# Patient Record
Sex: Female | Born: 1935 | Race: White | Hispanic: No | Marital: Married | Smoking: Never smoker
Health system: Southern US, Community
[De-identification: ages and names within clinical notes are randomized; demographics above are authoritative.]

## PROBLEM LIST (undated history)

## (undated) DIAGNOSIS — IMO0001 Reserved for inherently not codable concepts without codable children: Secondary | ICD-10-CM

## (undated) DIAGNOSIS — I4510 Unspecified right bundle-branch block: Secondary | ICD-10-CM

## (undated) DIAGNOSIS — R000 Tachycardia, unspecified: Secondary | ICD-10-CM

## (undated) DIAGNOSIS — I219 Acute myocardial infarction, unspecified: Secondary | ICD-10-CM

## (undated) DIAGNOSIS — I5033 Acute on chronic diastolic (congestive) heart failure: Secondary | ICD-10-CM

## (undated) DIAGNOSIS — W19XXXA Unspecified fall, initial encounter: Secondary | ICD-10-CM

## (undated) DIAGNOSIS — E039 Hypothyroidism, unspecified: Secondary | ICD-10-CM

## (undated) DIAGNOSIS — I2510 Atherosclerotic heart disease of native coronary artery without angina pectoris: Secondary | ICD-10-CM

## (undated) DIAGNOSIS — E785 Hyperlipidemia, unspecified: Secondary | ICD-10-CM

## (undated) DIAGNOSIS — M8580 Other specified disorders of bone density and structure, unspecified site: Secondary | ICD-10-CM

## (undated) DIAGNOSIS — R739 Hyperglycemia, unspecified: Secondary | ICD-10-CM

## (undated) DIAGNOSIS — I10 Essential (primary) hypertension: Secondary | ICD-10-CM

## (undated) DIAGNOSIS — I5189 Other ill-defined heart diseases: Secondary | ICD-10-CM

## (undated) HISTORY — DX: Hyperlipidemia, unspecified: E78.5

## (undated) HISTORY — DX: Acute myocardial infarction, unspecified: I21.9

## (undated) HISTORY — DX: Unspecified right bundle-branch block: I45.10

## (undated) HISTORY — DX: Hypothyroidism, unspecified: E03.9

## (undated) HISTORY — DX: Atherosclerotic heart disease of native coronary artery without angina pectoris: I25.10

## (undated) HISTORY — DX: Reserved for inherently not codable concepts without codable children: IMO0001

## (undated) HISTORY — DX: Essential (primary) hypertension: I10

## (undated) HISTORY — DX: Unspecified fall, initial encounter: W19.XXXA

## (undated) HISTORY — DX: Other specified disorders of bone density and structure, unspecified site: M85.80

---

## 1988-11-25 DIAGNOSIS — I219 Acute myocardial infarction, unspecified: Secondary | ICD-10-CM

## 1988-11-25 HISTORY — DX: Acute myocardial infarction, unspecified: I21.9

## 1989-09-22 HISTORY — PX: CARDIAC CATHETERIZATION: SHX172

## 1994-10-18 HISTORY — PX: CARDIAC CATHETERIZATION: SHX172

## 1998-03-06 ENCOUNTER — Other Ambulatory Visit: Admission: RE | Admit: 1998-03-06 | Discharge: 1998-03-06 | Payer: Self-pay | Admitting: Cardiology

## 2000-06-30 ENCOUNTER — Encounter: Admission: RE | Admit: 2000-06-30 | Discharge: 2000-06-30 | Payer: Self-pay | Admitting: Family Medicine

## 2000-06-30 ENCOUNTER — Encounter: Payer: Self-pay | Admitting: Family Medicine

## 2002-09-21 ENCOUNTER — Ambulatory Visit (HOSPITAL_COMMUNITY): Admission: RE | Admit: 2002-09-21 | Discharge: 2002-09-21 | Payer: Self-pay | Admitting: Cardiology

## 2005-09-09 ENCOUNTER — Encounter: Admission: RE | Admit: 2005-09-09 | Discharge: 2005-09-09 | Payer: Self-pay | Admitting: Orthopedic Surgery

## 2005-09-10 ENCOUNTER — Ambulatory Visit (HOSPITAL_COMMUNITY): Admission: RE | Admit: 2005-09-10 | Discharge: 2005-09-10 | Payer: Self-pay | Admitting: Orthopedic Surgery

## 2005-09-10 ENCOUNTER — Ambulatory Visit (HOSPITAL_BASED_OUTPATIENT_CLINIC_OR_DEPARTMENT_OTHER): Admission: RE | Admit: 2005-09-10 | Discharge: 2005-09-10 | Payer: Self-pay | Admitting: Orthopedic Surgery

## 2005-12-10 ENCOUNTER — Ambulatory Visit (HOSPITAL_BASED_OUTPATIENT_CLINIC_OR_DEPARTMENT_OTHER): Admission: RE | Admit: 2005-12-10 | Discharge: 2005-12-10 | Payer: Self-pay | Admitting: Orthopedic Surgery

## 2008-02-10 ENCOUNTER — Encounter: Admission: RE | Admit: 2008-02-10 | Discharge: 2008-02-10 | Payer: Self-pay | Admitting: Internal Medicine

## 2008-12-30 ENCOUNTER — Observation Stay (HOSPITAL_COMMUNITY): Admission: EM | Admit: 2008-12-30 | Discharge: 2008-12-31 | Payer: Self-pay | Admitting: Emergency Medicine

## 2009-06-15 ENCOUNTER — Emergency Department (HOSPITAL_COMMUNITY): Admission: EM | Admit: 2009-06-15 | Discharge: 2009-06-15 | Payer: Self-pay | Admitting: Emergency Medicine

## 2009-06-16 ENCOUNTER — Ambulatory Visit: Payer: Self-pay | Admitting: Vascular Surgery

## 2009-10-25 HISTORY — PX: CATARACT EXTRACTION, BILATERAL: SHX1313

## 2010-10-11 ENCOUNTER — Ambulatory Visit: Payer: Self-pay | Admitting: Vascular Surgery

## 2011-02-27 ENCOUNTER — Encounter: Payer: Self-pay | Admitting: Internal Medicine

## 2011-03-03 LAB — CBC
HCT: 37.2 % (ref 36.0–46.0)
MCHC: 33.9 g/dL (ref 30.0–36.0)
MCV: 93.4 fL (ref 78.0–100.0)
RBC: 3.98 MIL/uL (ref 3.87–5.11)
WBC: 5.4 10*3/uL (ref 4.0–10.5)

## 2011-03-03 LAB — DIFFERENTIAL
Basophils Absolute: 0 10*3/uL (ref 0.0–0.1)
Lymphocytes Relative: 28 % (ref 12–46)
Neutro Abs: 3.3 10*3/uL (ref 1.7–7.7)
Neutrophils Relative %: 61 % (ref 43–77)

## 2011-03-03 LAB — COMPREHENSIVE METABOLIC PANEL
BUN: 6 mg/dL (ref 6–23)
CO2: 28 mEq/L (ref 19–32)
Calcium: 10.1 mg/dL (ref 8.4–10.5)
Chloride: 100 mEq/L (ref 96–112)
Creatinine, Ser: 0.72 mg/dL (ref 0.4–1.2)
GFR calc non Af Amer: >60 mL/min (ref >60)
Total Bilirubin: 1.2 mg/dL (ref 0.3–1.2)

## 2011-03-12 LAB — CBC
HCT: 37.1 % (ref 36.0–46.0)
Hemoglobin: 12.8 g/dL (ref 12.0–15.0)
MCV: 91.1 fL (ref 78.0–100.0)
RBC: 4.07 MIL/uL (ref 3.87–5.11)
WBC: 5.4 10*3/uL (ref 4.0–10.5)

## 2011-03-12 LAB — POCT CARDIAC MARKERS
CKMB, poc: 2 ng/mL (ref 1.0–8.0)
Myoglobin, poc: 147 ng/mL (ref 12–200)

## 2011-03-12 LAB — URINALYSIS, ROUTINE W REFLEX MICROSCOPIC
Ketones, ur: NEGATIVE mg/dL
Nitrite: NEGATIVE
Protein, ur: NEGATIVE mg/dL
pH: 6 (ref 5.0–8.0)

## 2011-03-12 LAB — CARDIAC PANEL(CRET KIN+CKTOT+MB+TROPI)
CK, MB: 2.3 ng/mL (ref 0.3–4.0)
Total CK: 107 U/L (ref 7–177)
Troponin I: <0.01 ng/mL (ref 0.00–0.06)

## 2011-03-12 LAB — TROPONIN I: Troponin I: <0.01 ng/mL (ref 0.00–0.06)

## 2011-03-12 LAB — DIFFERENTIAL
Eosinophils Absolute: 0.1 10*3/uL (ref 0.0–0.7)
Lymphs Abs: 2.1 10*3/uL (ref 0.7–4.0)
Monocytes Relative: 11 % (ref 3–12)
Neutrophils Relative %: 49 % (ref 43–77)

## 2011-03-12 LAB — BASIC METABOLIC PANEL
Chloride: 101 mEq/L (ref 96–112)
Creatinine, Ser: 0.72 mg/dL (ref 0.4–1.2)
GFR calc Af Amer: >60 mL/min (ref >60)
Potassium: 3.6 mEq/L (ref 3.5–5.1)
Sodium: 136 mEq/L (ref 135–145)

## 2011-03-12 LAB — GLUCOSE, CAPILLARY: Glucose-Capillary: 114 mg/dL — ABNORMAL HIGH (ref 70–99)

## 2011-03-12 LAB — CK TOTAL AND CKMB (NOT AT ARMC): Relative Index: 3.1 — ABNORMAL HIGH (ref 0.0–2.5)

## 2011-04-09 NOTE — Discharge Summary (Signed)
Erika [name], Erika [name]               ACCOUNT NO.:  [id number]   MEDICAL RECORD NO.:  [id number]          PATIENT TYPE:  OBV   LOCATION:  4729                         FACILITY:  [facility]   PHYSICIAN:  Gaspar Garbe, M.D.DATE OF BIRTH:  Apr 19, 1928   DATE OF ADMISSION:  12/30/2008  DATE OF DISCHARGE:  12/31/2008                               DISCHARGE SUMMARY   FINAL DIAGNOSES:  1. Loss of consciousness status post fall, momentary.  2. History of hypertension.  3. Hyperlipidemia.  4. Coronary artery disease.  5. History of gout.  6. Osteopenia.   MEDICATIONS ON DISCHARGE:  Unchanged from admission which include:  1. Lipitor 20 mg once daily.  2. Zetia 10 mg once daily.  3. Aspirin 325 mg once daily.  4. Amlodipine 5 mg once daily.  5. Vitamin D, calcium, multivitamins as prior.  6. Bumex as needed for swelling.   PHYSICAL EXAMINATION ON DATE OF DISCHARGE:  VITAL SIGNS:  Blood pressure  140/84, heart rate 81, respiratory rate 16, temperature 98.6, sating 98%  on room air.  GENERAL:  In no acute distress.  HEENT:  Normocephalic and atraumatic.  PERRLA.  EOMI.  ENT is within  normal limits.  NECK:  Supple.  No lymphadenopathy, JVD, or bruit.  HEART:  Regular rate and rhythm.  No murmur, rub, or gallop.  LUNGS:  Clear to auscultation bilaterally.  ABDOMEN:  Soft, nontender, normoactive bowel sounds.  NEUROLOGIC:  The patient is oriented to person, place, and time with no  weakness and no lateralizing signs.   LABORATORY TESTING:  The patient ruled out for MI with negative  troponins and negative CT of her head, negative neck x-rays and negative  chest x-ray at the time of admission.   SUMMARY OF HOSPITAL COURSE:  Ms. Denard was admitted after suffering a  fall with momentary loss of consciousness.  It is now believed that she  most likely tripped and lost consciousness after she hit her head and  had people standing over her.  She was observed in the hospital for 24  hours and  subsequently discharged home without incident.  She had neuro  checks q.4 without any problems and did not have any evidence of  arrhythmia on telemetry  or any changes in her cardiac enzymes.  She is being sent home with  family and is to return to her normal medications.  She is to contact  our office as needed.   CONDITION ON DISCHARGE:  Stable.      Gaspar Garbe, M.D.  Electronically Signed     RWT/MEDQ  D:  12/31/2008  T:  12/31/2008  Job:  [id number]

## 2011-04-09 NOTE — Procedures (Signed)
CAROTID DUPLEX EXAM   INDICATION:  Bilateral carotid stenosis.   HISTORY:  Diabetes:  No.  Cardiac:  MI.  Hypertension:  Yes.  Smoking:  No.  Previous Surgery:  No.  CV History:  06/14/2009, right small thalamus stroke.  The patient is  currently asymptomatic.  Amaurosis Fugax No, Paresthesias No, Hemiparesis No                                       RIGHT             LEFT  Brachial systolic pressure:         180               176  Brachial Doppler waveforms:         WNL               WNL  Vertebral direction of flow:        Antegrade         Antegrade  DUPLEX VELOCITIES (cm/sec)  CCA peak systolic                   71                77  ECA peak systolic                   85                83  ICA peak systolic                   99                80  ICA end diastolic                   20                28  PLAQUE MORPHOLOGY:                  Heterogeneous     Heterogeneous  PLAQUE AMOUNT:                      Moderate          Moderate  PLAQUE LOCATION:                    Proximal ICA and the bulb           Proximal ICA and the  bulb   IMPRESSION:  No hemodynamically significant stenosis.  Bilateral intimal  thickening with the common carotid arteries.  Stable study compared to  previous.    ___________________________________________  Quita Skye Hart [place], M.D.   OD/MEDQ  D:  10/11/2010  T:  10/11/2010  Job:  [id number]

## 2011-04-09 NOTE — H&P (Signed)
Erika [name], EPHRAIM NO.:  [id number]   MEDICAL RECORD NO.:  [id number]          PATIENT TYPE:  OBV   LOCATION:  4729                         FACILITY:  [facility]   PHYSICIAN:  Gaspar Garbe, M.D.DATE OF BIRTH:  Apr 12, 1928   DATE OF ADMISSION:  12/30/2008  DATE OF DISCHARGE:                              HISTORY & PHYSICAL   CHIEF COMPLAINT:  Fell down stairs   HISTORY OF PRESENT ILLNESS:  The patient is a 77 year old white female  with a history of coronary artery disease and MI in 1990, is otherwise  physically and mentally active, who had an incident today where she fell  down half a flight of stairs.  It was not made quite certain whether she  passed out or tripped.  She later states that she tripped, but indicated  that she came to where people standing over her and is not exactly sure  what happened.  She was brought by EMS to [facility] Emergency Room  where she underwent evaluation including CT of her head which was  negative, EKG which was negative, plain films of her neck which were  negative, and neurologic evaluation, all which were deemed normal.  The  patient, however, is being admitted for 23-hour observation as the cause  of her fall could not be ascertained completely.  She does not have  memory for about a minute to minute and half of what happened after she  fell and how people came to stand over her.  She indicates that she felt  to be in her normal state of health during the course of the day and had  no other difficulties.   Family is present in the room with her and indicate that she is  neurologically intact and they have not noticed any deficits while  waiting for me to arrive to admit her.   ALLERGIES:  No known drug allergies.   MEDICATIONS:  1. Lipitor 20 mg daily.  2. Zetia 10 mg daily.  3. Bumex p.r.n. for edema.  4. Aspirin 325 mg per day.  5. Amlodipine 5 mg per day.  6. Vitamin D3 1000 units per day.  7. Calcium and  multivitamin per day.   PAST MEDICAL HISTORY:  1. Coronary artery disease, status post MI in 1990.  2. Hypertension.  3. Hyperlipidemia.  4. Osteopenia.  5. Hypothyroidism which is listed in her chart, she is not on      medications for this.  6. History of gout.   PAST SURGICAL HISTORY:  The patient has had right carpal tunnel repair  in 2000.   SOCIAL HISTORY:  The patient lives in [place] with her husband who is  in attendance.  She currently works  at a funeral home, is married with  one remaining child.  She had one who died of nasopharyngeal cancer  around age 22.  She is a nonsmoker and rarely drinks.   FAMILY HISTORY:  Mother died at age 70 of coronary artery disease as did  father at age 10.  She has a family history of CAD and COPD.  REVIEW OF SYMPTOMS:  The patient complains of mild headache, but 15-  point review of systems is otherwise negative.   ADVANCE DIRECTIVES:  The patient is full code.   PHYSICAL EXAMINATION:  VITAL SIGNS:  Temperature 98.2, pulse 65,  respiratory rate 14, blood pressure 185/69, and oxygen sats 99% on room  air.  GENERAL:  No acute distress.  HEENT:  Normocephalic and atraumatic.  There is no evidence of any  bruising or hematoma.  HEENT is within normal limits.  NECK:  Supple.  No lymphadenopathy, JVD, or bruit.  HEART:  Regular rate and rhythm.  No murmur, rub, or gallop.  LUNGS:  Clear to auscultation bilaterally.  ABDOMEN:  Soft and nontender.  Normoactive bowel sounds.  NEURO:  The patient is oriented to person, place, and time.  She has 5/5  strength bilaterally.  No other neurologic deficits noted.  EXTREMITIES:  No clubbing, cyanosis, or edema.   TESTING:  The patient had a chest x-ray which is nonacute.  CT of her  head which was negative except for small vessel disease.  She had C-  spine films which were negative for fracture.   PRIOR TESTING:  The patient had a Cardiolite in June 2009 which was  negative per Dr.  [name] office.   LABORATORY DATA:  Hemoglobin 12.8, hematocrit 37.1, white count 5.4, and  platelets 242.  Electrolytes all within normal limits.  Glucose slightly  elevated at 114.  Myoglobin 147.  CK-MB 2.  Troponin 0.05.   ASSESSMENT/PLAN:  1. Status post fall, not sure whether she tripped and fell and had a      momentary loss of consciousness or lost of consciousness and      therefore fell.  We will observe her for 23-hour observation.  We      will rule her out for MI.  We will maintain her on telemetry and      have neuro checks q.4 h. as she has had some head trauma.      Fortunately, her CT of her head is negative.  Inform the nurses to      notify me if she has any change in mental status and we will repeat      cranial imaging if this is the case.  2. Hypertension.  Continue her Norvasc.  This is elevated right now,      but she seems to be somewhat excited.  We will continue to follow      her.  Of note, the patient has had outpatient blood pressure      monitoring per Dr. Ferd Hibbs office as well.  May need further      followup for this.  3. Hyperlipidemia.  Continue her on Lipitor and Zetia combination.  4. The patient has been placed on Lovenox for DVT prophylaxis and      Protonix for GI prophylaxis during her hospital stay.      Gaspar Garbe, M.D.  Electronically Signed     RWT/MEDQ  D:  12/30/2008  T:  12/31/2008  Job:  [id number]   cc:   Gwen Pounds, MD  Colleen Can Deborah Chalk, M.D.

## 2011-04-09 NOTE — Procedures (Signed)
CAROTID DUPLEX EXAM   INDICATION:  Right thalamic stroke.   HISTORY:  Diabetes:  No.  Cardiac:  MI.  Hypertension:  Yes.  Smoking:  No.  Previous Surgery:  No.  CV History:  Left fingers and side of mouth paresthesias 06/14/2009.  Right small thalamic stroke.  Amaurosis Fugax No, Paresthesias Yes, Hemiparesis No                                       RIGHT             LEFT  Brachial systolic pressure:         184               182  Brachial Doppler waveforms:         WNL               WNL  Vertebral direction of flow:        Antegrade         Antegrade  DUPLEX VELOCITIES (cm/sec)  CCA peak systolic                   93                87  ECA peak systolic                   142               116  ICA peak systolic                   139               120  ICA end diastolic                   28                36  PLAQUE MORPHOLOGY:                  Calcific          Calcific  PLAQUE AMOUNT:                      Mild/moderate     Mild/moderate  PLAQUE LOCATION:                    Proximal ICA/bifurcation            Proximal ICA/bifurcation   IMPRESSION:  Bilateral ICAs show evidence of 40-59% stenosis (low end of  range).   ___________________________________________  Di Kindle. Edilia Bo, M.D.   AS/MEDQ  D:  06/16/2009  T:  06/16/2009  Job:  [id number]   cc:   Colleen Can. Deborah Chalk, M.D.

## 2011-04-12 NOTE — Op Note (Signed)
[name], [name]               ACCOUNT NO.:  [id number]   MEDICAL RECORD NO.:  [id number]          PATIENT TYPE:  AMB   LOCATION:  DSC                          FACILITY:  [facility]   PHYSICIAN:  Katy Fitch. Sypher, M.D. DATE OF BIRTH:  [birth year]   DATE OF PROCEDURE:  09/10/2005  DATE OF DISCHARGE:                                 OPERATIVE REPORT   PREOPERATIVE DIAGNOSIS:  Chronic entrapment neuropathy, right median nerve  at level of carpal tunnel.   POSTOPERATIVE DIAGNOSIS:  Chronic entrapment neuropathy, right median nerve  at level of carpal tunnel.   OPERATION:  Release of right transverse carpal ligament.   OPERATING SURGEON:  Katy Fitch. Sypher, MD   ASSISTANT:  Annye Rusk PA-C   ANESTHESIA:  General sedation/IV regional.   SUPERVISING ANESTHESIOLOGIST:  Judie Petit, MD   INDICATIONS:  Erika [name] is a 77 year old woman referred through the  courtesy of Dr. Creola Corn for evaluation and management of a painful and  numb right hand.  Clinical examination revealed evidence of entrapment  neuropathy of the median nerve at the level of the transverse carpal  ligament.  Electrodiagnostic studies completed by Dr. Jillyn Hidden on August 26, 2005 documented significant right carpal tunnel syndrome.   Due to a failure to respond to nonoperative measures, she is brought to the  operating room at this time for release of her right transcarpal ligament.   PROCEDURE:  Erika [name] was brought to the operating room and placed in a  supine position upon the operating table.  Following placement of an IV  regional block, the right arm was prepped with Betadine soap and solution  and sterilely draped.  After 10 minutes, anesthesia was satisfactory in the  right arm.   Procedure commenced with a short incision in the line of the ring finger and  the palm.  The subcutaneous tissue were carefully divided, revealing the  palmar fascia.  This was split longitudinally to revealing the  common  extensor branches of the median nerve.   These were followed back to the transverse carpal ligament, which was  carefully isolated from the median nerve.   The ligament was released along its ulnar border extending into the distal  forearm.   This widely opened the carpal canal.  No mass or other predicaments were  noted.   Bleeding points along the margin of the released ligament were  electrocauterized with bipolar current followed by repair of the skin with  intradermal 3-0 Prolene.   A compressive dressing was applied with a volar plaster splint to maintain  the wrist in 5 degrees of dorsiflexion.      Katy Fitch Sypher, M.D.  Electronically Signed     RVS/MEDQ  D:  09/10/2005  T:  09/10/2005  Job:  [id number]   cc:   Gwen Pounds, MD  Fax: [phone]

## 2011-04-12 NOTE — Op Note (Signed)
[name], [name]               ACCOUNT NO.:  [id number]   MEDICAL RECORD NO.:  [id number]          PATIENT TYPE:  AMB   LOCATION:  DSC                          FACILITY:  [facility]   PHYSICIAN:  Katy Fitch. Sypher, M.D. DATE OF BIRTH:  Nov 25, 1928   DATE OF PROCEDURE:  12/10/2005  DATE OF DISCHARGE:                                 OPERATIVE REPORT   PREOP DIAGNOSIS:  Entrapment neuropathy left median nerve at carpal tunnel.   POSTOPERATIVE DIAGNOSIS:  Entrapment neuropathy left median nerve at carpal  tunnel.   OPERATIONS:  Release of left transverse carpal ligament.   OPERATIONS:  Katy Fitch. Sypher, M.D.   ASSISTANT:  Marveen Reeks. Dasnoit, P.A.-C.   ANESTHESIA:  General by LMA.   SUPERVISING ANESTHESIOLOGIST:  Janetta Hora. Gelene Mink, M.D.   INDICATIONS:  Erika [name] is a 77 year old woman referred for evaluation  and management of bilateral carpal tunnel syndrome. She is status post right  carpal tunnel release with satisfactory results. She now presents for  similar surgery on the left.   PREOPERATIVE LAB STUDIES:  Revealed that she was a proper candidate for  general anesthesia. She had Electrodiagnostic studies documenting  significant left median neuropathy at the level of the wrist.   PROCEDURE:  Erika [name] is brought to the operating room and placed in the  supine position on the operating table.   Following the induction of general anesthesia by LMA technique. The left arm  was prepped with Betadine soap and solution, sterilely draped. Following  exsanguination of the limb with an esmarch bandage an arterial tourniquet in  the proximal brachium was inflated to 220 mmHg. The procedure commenced with  a short incision in the line of the ring finger and the palm.  The  subcutaneous tissue was carefully divided within the palmar fascia. This was  split longitudinally to reveal the __________ branch of the medial nerve.  These are followed back to transcarpal ligament which  was gently isolated  from the median nerve with a Penfield-4 elevator.   The ligament was released along its ulnar border extending into the distal  forearm. This widely opened the carpal canal. No masses or other  predicaments were noted. Bleeding points along the margin of the margin of  the loose ligament were electrocauterized by bipolar current; followed by  repair of the skin with intradermal 3-0 Prolene.   A compressive dressings is applied with a volar plaster splint maintaining  the wrist in 5 degrees of dorsiflexion.      Katy Fitch Sypher, M.D.  Electronically Signed     RVS/MEDQ  D:  12/10/2005  T:  12/10/2005  Job:  [id number]

## 2011-04-25 ENCOUNTER — Encounter: Payer: Self-pay | Admitting: Cardiology

## 2011-04-25 ENCOUNTER — Ambulatory Visit (INDEPENDENT_AMBULATORY_CARE_PROVIDER_SITE_OTHER): Payer: Medicare Other | Admitting: Cardiology

## 2011-04-25 DIAGNOSIS — E785 Hyperlipidemia, unspecified: Secondary | ICD-10-CM

## 2011-04-25 DIAGNOSIS — I2510 Atherosclerotic heart disease of native coronary artery without angina pectoris: Secondary | ICD-10-CM

## 2011-04-26 ENCOUNTER — Encounter: Payer: Self-pay | Admitting: Cardiology

## 2011-04-26 DIAGNOSIS — E785 Hyperlipidemia, unspecified: Secondary | ICD-10-CM | POA: Insufficient documentation

## 2011-04-26 DIAGNOSIS — I2510 Atherosclerotic heart disease of native coronary artery without angina pectoris: Secondary | ICD-10-CM | POA: Insufficient documentation

## 2011-04-26 NOTE — Assessment & Plan Note (Signed)
She remains on Lipitor 40 grams per day her last cholesterol levels should LDL cholesterol of 87 with HDL of 56 and triglycerides of 74.

## 2011-04-26 NOTE — Progress Notes (Signed)
Subjective:   Erika [name] comes in today for followup visit. In general, she's continued to do well. She's not had any recurrent chest discomfort and has remained active. She brings in cookies and cake today and we say goodbye after taking care of her for the last 22 years. In general, she continues to do well and her last stress test was in July 2011 she an ejection fraction of 90% at that time. Lab work from Dr. Timothy Lasso is reviewed and is satisfactory.  Current Outpatient Prescriptions  Medication Sig Dispense Refill  . amLODipine (NORVASC) 5 MG tablet Take 5 mg by mouth daily.        [name] aspirin 325 MG tablet Take 325 mg by mouth daily.        [name] atorvastatin (LIPITOR) 10 MG tablet Take 40 mg by mouth daily.        . B Complex Vitamins (B-COMPLEX/B-12) TABS Take 1 tablet by mouth daily.        . bumetanide (BUMEX) 1 MG tablet Take 1 mg by mouth as needed.        . Calcium Carbonate (CALCIUM 600 PO) Take 1 tablet by mouth daily.        . calcium-vitamin D (OSCAL WITH D) 500-200 MG-UNIT per tablet Take 1 tablet by mouth daily.        [name] ezetimibe (ZETIA) 10 MG tablet Take 10 mg by mouth daily.        . fish oil-omega-3 fatty acids 1000 MG capsule Take 1 g by mouth daily.        [name] lisinopril (PRINIVIL,ZESTRIL) 20 MG tablet Take 40 mg by mouth daily.       . Multiple Vitamin (MULTIVITAMIN) tablet Take 1 tablet by mouth daily.        . Multiple Vitamin (VIT E-VIT C-BETA CAROTENE PO) Take 1 tablet by mouth daily.          No Known Allergies  There is no problem list on file for this patient.   History  Smoking status  . Never Smoker   Smokeless tobacco  . Never Used    History  Alcohol Use No    Family History  Problem Relation Age of Onset  . Heart attack Father 52  . Heart attack Mother 46  . Heart disease Brother   . Heart disease Brother     Review of Systems:   The patient denies any heat or cold intolerance.  No weight gain or weight loss.  The patient denies headaches or  blurry vision.  There is no cough or sputum production.  The patient denies dizziness.  There is no hematuria or hematochezia.  The patient denies any muscle aches or arthritis.  The patient denies any rash.  The patient denies frequent falling or instability.  There is no history of depression or anxiety.  All other systems were reviewed and are negative.   Physical Exam:   Blood pressure 140/70. Weights 135. Heart rate is 60.  The head is normocephalic and atraumatic.  Pupils are equally round and reactive to light.  Sclerae nonicteric.  Conjunctiva is clear.  Oropharynx is unremarkable.  There's adequate oral airway.  Neck is supple there are no masses.  Thyroid is not enlarged.  There is no lymphadenopathy.  Lungs are clear.  Chest is symmetric.  Heart shows a regular rate and rhythm.  S1 and S2 are normal.  There is no murmur click or gallop.  Abdomen is soft normal bowel  sounds.  There is no organomegaly.  Genital and rectal deferred.  Extremities are without edema.  Peripheral pulses are adequate.  Neurologically intact.  Full range of motion.  The patient is not depressed.  Skin is warm and dry.  Assessment / Plan:

## 2011-04-26 NOTE — Assessment & Plan Note (Signed)
She had an inferior myocardial infarction in 1990 with catheterization last in 1995. She's continued to do well without recurrent symptoms. We'll continue to modify cardiovascular risk factors. She remains on atorvastatin with excellent lipid levels

## 2011-05-26 DIAGNOSIS — IMO0001 Reserved for inherently not codable concepts without codable children: Secondary | ICD-10-CM

## 2011-05-26 HISTORY — DX: Reserved for inherently not codable concepts without codable children: IMO0001

## 2011-07-27 DIAGNOSIS — W19XXXA Unspecified fall, initial encounter: Secondary | ICD-10-CM

## 2011-07-27 HISTORY — DX: Unspecified fall, initial encounter: W19.XXXA

## 2011-08-01 ENCOUNTER — Other Ambulatory Visit: Payer: Self-pay | Admitting: Cardiology

## 2011-08-01 NOTE — Telephone Encounter (Signed)
escribe medication per fax request  

## 2011-08-22 ENCOUNTER — Ambulatory Visit
Admission: RE | Admit: 2011-08-22 | Discharge: 2011-08-22 | Disposition: A | Discharge status: Home / Self Care | Payer: Medicare Other | Source: Ambulatory Visit | Attending: Family Medicine | Admitting: Family Medicine

## 2011-08-22 ENCOUNTER — Other Ambulatory Visit: Payer: Self-pay | Admitting: Family Medicine

## 2011-08-22 DIAGNOSIS — S0083XA Contusion of other part of head, initial encounter: Secondary | ICD-10-CM

## 2011-08-26 ENCOUNTER — Ambulatory Visit (INDEPENDENT_AMBULATORY_CARE_PROVIDER_SITE_OTHER): Payer: Medicare Other | Admitting: Nurse Practitioner

## 2011-08-26 ENCOUNTER — Encounter: Payer: Self-pay | Admitting: Nurse Practitioner

## 2011-08-26 ENCOUNTER — Telehealth: Payer: Self-pay | Admitting: Nurse Practitioner

## 2011-08-26 VITALS — BP 158/72 | HR 72 | Ht 60.0 in | Wt 136.0 lb

## 2011-08-26 DIAGNOSIS — I10 Essential (primary) hypertension: Secondary | ICD-10-CM

## 2011-08-26 DIAGNOSIS — E785 Hyperlipidemia, unspecified: Secondary | ICD-10-CM

## 2011-08-26 DIAGNOSIS — W19XXXA Unspecified fall, initial encounter: Secondary | ICD-10-CM

## 2011-08-26 DIAGNOSIS — I2510 Atherosclerotic heart disease of native coronary artery without angina pectoris: Secondary | ICD-10-CM

## 2011-08-26 NOTE — Assessment & Plan Note (Addendum)
I worry that this was a syncopal spell. We will place an event monitor for the next 30 days to rule out arrhythmia. I will tentatively see her back in 6 months or sooner if problems arise in the interim.    EKG from Urgent Care shows sinus with RBBB. This is chronic. Her last stress test was in 2011. Will see what the event monitor shows over the next 1 month.

## 2011-08-26 NOTE — Assessment & Plan Note (Signed)
Labs are checked by primary care.  

## 2011-08-26 NOTE — Assessment & Plan Note (Addendum)
No chest pain or shortness of breath. She will continue with her current medicines. I will see again in 6 months. Dr. [name] will be her assigned cardiologist. Patient is agreeable to this plan and will call if any problems develop in the interim.   Received EKG from Urgent Care. Has RBBB that is chronic. Her last nuclear was in July of 2011.

## 2011-08-26 NOTE — Progress Notes (Addendum)
Erika [name] Date of Birth: [birth year]   History of Present Illness: Erika [name] is seen back today for her 6 month check. She is seen for Dr. Elease [name]. She is a former patient of Dr. Ronnald [name]. She has been doing well from a cardiac standpoint. No chest pain or shortness of breath. Blood pressure at home has been good. She did suffer a rather significant fall this past Wednesday. She was going into her house. She was opening the front door and that is all she remembers. Her husband found her. She was bleeding. No warning whatsoever. She suffered a left zygoma fracture of the face. She also had to have some stitches. She was seen at the Urgent Care and had a negative EKG. She has not had recurrence and feels just fine.   Current Outpatient Prescriptions on File Prior to Visit  Medication Sig Dispense Refill  . alendronate (FOSAMAX) 70 MG tablet Take 70 mg by mouth every 30 (thirty) days. Take with a full glass of water on an empty stomach.       [name] amLODipine (NORVASC) 5 MG tablet Take 5 mg by mouth daily.        [name] aspirin 325 MG tablet Take 325 mg by mouth daily.        . B Complex Vitamins (B-COMPLEX/B-12) TABS Take 1 tablet by mouth daily.        . bumetanide (BUMEX) 1 MG tablet Take 1 mg by mouth as needed.        . Calcium Carbonate (CALCIUM 600 PO) Take 1 tablet by mouth daily.        . calcium-vitamin D (OSCAL WITH D) 500-200 MG-UNIT per tablet Take 1 tablet by mouth daily.        . fish oil-omega-3 fatty acids 1000 MG capsule Take 1 g by mouth daily.        . Multiple Vitamin (MULTIVITAMIN) tablet Take 1 tablet by mouth daily.        . Multiple Vitamin (VIT E-VIT C-BETA CAROTENE PO) Take 1 tablet by mouth daily.        [name] ZETIA 10 MG tablet TAKE ONE TABLET BY MOUTH EVERY DAY  30 each  5  . DISCONTD: atorvastatin (LIPITOR) 10 MG tablet Take 40 mg by mouth daily.        [name] DISCONTD: lisinopril (PRINIVIL,ZESTRIL) 20 MG tablet Take 40 mg by mouth daily.         No Known Allergies  Past  Medical History  Diagnosis Date  . Hypertension   . Hyperlipidemia   . Myocardial infarction 1990  . CAD (coronary artery disease)     post MI in 86  . Osteopenia   . Hypothyroidism   . Fall Sept 2012    with left zygoma fracture  . Normal nuclear stress test July 2012    No ischemia.   . Right bundle branch block     Past Surgical History  Procedure Date  . Cardiac catheterization 10/18/1994    Single vessel disease with a severe stenosis in the RCA which had recannalized since prior study in 1990. Managed medically  . Cardiac catheterization 09/22/89  . Cataract extraction, bilateral 10/2009    History  Smoking status  . Never Smoker   Smokeless tobacco  . Never Used    History  Alcohol Use No    Family History  Problem Relation Age of Onset  . Heart attack Father 57  . Heart attack Mother 28  .  Heart disease Brother   . Heart disease Brother     Review of Systems: The review of systems is positive for recent fall with fracture.  Blood pressure is good at home. All other systems were reviewed and are negative.  Physical Exam: BP 158/72  Pulse 72  Ht 5' (1.524 m)  Wt 136 lb (61.689 kg)  BMI 26.56 kg/m2  LABORATORY DATA:   Assessment / Plan:

## 2011-08-26 NOTE — Patient Instructions (Signed)
We are going to put a monitor on you for the next month to make sure your heart rhythm is ok I will see you back in 6 months, but sooner if you have any problems Call for any problems.

## 2011-09-06 NOTE — Telephone Encounter (Signed)
Opened in error

## 2011-10-23 ENCOUNTER — Encounter: Payer: Self-pay | Admitting: Nurse Practitioner

## 2011-12-04 ENCOUNTER — Encounter: Payer: Self-pay | Admitting: Cardiovascular Disease

## 2012-01-24 ENCOUNTER — Other Ambulatory Visit: Payer: Self-pay | Admitting: Nurse Practitioner

## 2012-02-07 ENCOUNTER — Telehealth: Payer: Self-pay | Admitting: Nurse Practitioner

## 2012-02-07 NOTE — Telephone Encounter (Signed)
Lawson Fiscal will review monitor report and will call pt results.  Pt aware.

## 2012-02-07 NOTE — Telephone Encounter (Signed)
New msg Pt wants to know results from heart monitor. She hasnt heard anything. Please call

## 2012-02-24 ENCOUNTER — Telehealth: Payer: Self-pay | Admitting: Nurse Practitioner

## 2012-02-24 NOTE — Telephone Encounter (Signed)
Pt Mailed letter asking for Records to be sent to another Facility, I have spoke with  Pt let her Know a Release had to be signed, she Ok'd, Mailed ROI  to Home address  02/24/12/KM

## 2012-03-05 NOTE — Telephone Encounter (Signed)
Records faxed to Dr.Tilley Office @  [phone] 03/05/12/KM

## 2012-09-23 ENCOUNTER — Other Ambulatory Visit: Payer: Self-pay | Admitting: Nurse Practitioner

## 2012-09-29 ENCOUNTER — Encounter: Payer: Self-pay | Admitting: Vascular Surgery

## 2012-10-30 ENCOUNTER — Other Ambulatory Visit: Payer: Self-pay | Admitting: *Deleted

## 2012-10-30 MED ORDER — EZETIMIBE 10 MG PO TABS: 10.0000 mg | ORAL_TABLET | Freq: Every day | ORAL | Status: DC

## 2013-12-26 ENCOUNTER — Ambulatory Visit: Payer: Medicare Other | Admitting: Emergency Medicine

## 2013-12-26 VITALS — BP 122/80 | HR 80

## 2013-12-26 DIAGNOSIS — S0100XA Unspecified open wound of scalp, initial encounter: Secondary | ICD-10-CM

## 2013-12-26 NOTE — Progress Notes (Signed)
Subjective:    Patient ID: Erika [name], female    DOB: [birth year], 80 y.o.   MRN: [id number]  HPI This chart was scribed for Erika Queen, MD by Thea Alken, ED Scribe. This patient was seen in room 7 and the patient's care was started at 6:10 PM.  HPI Comments: Erika [name] is a 80 y.o. female who presents to the [facility] complaining of a fall onset prior to arrival. Pt states that she was leaning over getting undressed when she tipped over and hit her head on the corner of her T.V stand. Due to the fall pt now has a laceration on the left side of her head. Pt states that her husband was in the next room and was able to bring her to [facility]. Pt denies syncope or feeling sick. Pt works at a funeral home. Pt is not allergic to any medication. She reports that she takes Lipitor. Pt was seen before in the past for a fall. Pt sees a doctor every 6 months and is UTD with her TDAP.    Past Medical History  Diagnosis Date   Hypertension    Hyperlipidemia    Myocardial infarction 1990   CAD (coronary artery disease)     post MI in 1990   Osteopenia    Hypothyroidism    Fall Sept 2012    with left zygoma fracture   Normal nuclear stress test July 2012    No ischemia.    Right bundle branch block    No Known Allergies Prior to Admission medications   Medication Sig Start Date End Date Taking? Authorizing Provider  alendronate (FOSAMAX) 70 MG tablet Take 70 mg by mouth every 30 (thirty) days. Take with a full glass of water on an empty stomach.     Historical Provider, MD  amLODipine (NORVASC) 5 MG tablet Take 5 mg by mouth daily.      Historical Provider, MD  aspirin 325 MG tablet Take 325 mg by mouth daily.      Historical Provider, MD  atorvastatin (LIPITOR) 20 MG tablet Take 20 mg by mouth daily.      Historical Provider, MD  B Complex Vitamins (B-COMPLEX/B-12) TABS Take 1 tablet by mouth daily.      Historical Provider, MD  bumetanide (BUMEX) 1 MG  tablet Take 1 mg by mouth as needed.      Historical Provider, MD  Calcium Carbonate (CALCIUM 600 PO) Take 1 tablet by mouth daily.      Historical Provider, MD  calcium-vitamin D (OSCAL WITH D) 500-200 MG-UNIT per tablet Take 1 tablet by mouth daily.      Historical Provider, MD  ezetimibe (ZETIA) 10 MG tablet Take 1 tablet (10 mg total) by mouth daily. 10/30/12   Burtis Junes, NP  fish oil-omega-3 fatty acids 1000 MG capsule Take 1 g by mouth daily.      Historical Provider, MD  lisinopril (PRINIVIL,ZESTRIL) 40 MG tablet Take 40 mg by mouth daily.      Historical Provider, MD  Multiple Vitamin (MULTIVITAMIN) tablet Take 1 tablet by mouth daily.      Historical Provider, MD  Multiple Vitamin (VIT E-VIT C-BETA CAROTENE PO) Take 1 tablet by mouth daily.      Historical Provider, MD   Review of Systems  Skin: Positive for wound.  Neurological: Negative for dizziness, syncope and light-headedness.       Objective:   Physical Exam CONSTITUTIONAL: Well developed/well nourished  HEAD: Normocephalic/atraumatic EYES: EOMI/PERRL ENMT: Mucous membranes moist NECK: supple no meningeal signs SPINE:entire spine nontender CV: S1/S2 noted, no murmurs/rubs/gallops noted LUNGS: Lungs are clear to auscultation bilaterally, no apparent distress ABDOMEN: soft, nontender, no rebound or guarding GU:no cva tenderness NEURO: Pt is awake/alert, moves all extremitiesx4 EXTREMITIES: pulses normal, full ROM SKIN: warm, color normal PSYCH: no abnormalities of mood noted      Assessment & Plan:  Patient had vagal Rx. During procedure. She responded to laying flat and having legs elevated. No neuro signs. Spell lasted about 10 minutes. Patient did well.

## 2013-12-26 NOTE — Progress Notes (Signed)
Subjective:    Patient ID: Erika [name], female    DOB: [birth year], 80 y.o.   MRN: [id number]  HPI This chart was scribed for [name], MD by Thea Alken, ED Scribe. This patient was seen in room 7 and the patient's care was started at 6:10 PM.  HPI Comments: Erika [name] is a 80 y.o. female who presents to the [facility] complaining of a fall onset prior to arrival. Pt states that she was leaning over getting undressed when she tipped over and hit her head on the corner of her T.V stand. Due to the fall pt now has a laceration on the left side of her head. Pt states that her husband was in the next room and was able to bring her to [facility]. Pt denies syncope or feeling sick. Pt works at a funeral home. Pt is not allergic to any medication. She reports that she takes Lipitor. Pt was seen before in the past for a fall. Pt sees a doctor every 6 months and is UTD with her TDAP.    Past Medical History  Diagnosis Date  . Hypertension   . Hyperlipidemia   . Myocardial infarction 1990  . CAD (coronary artery disease)     post MI in 17  . Osteopenia   . Hypothyroidism   . Fall Sept 2012    with left zygoma fracture  . Normal nuclear stress test July 2012    No ischemia.   . Right bundle branch block    No Known Allergies Prior to Admission medications   Medication Sig Start Date End Date Taking? Authorizing Provider  alendronate (FOSAMAX) 70 MG tablet Take 70 mg by mouth every 30 (thirty) days. Take with a full glass of water on an empty stomach.     Historical Provider, MD  amLODipine (NORVASC) 5 MG tablet Take 5 mg by mouth daily.      Historical Provider, MD  aspirin 325 MG tablet Take 325 mg by mouth daily.      Historical Provider, MD  atorvastatin (LIPITOR) 20 MG tablet Take 20 mg by mouth daily.      Historical Provider, MD  B Complex Vitamins (B-COMPLEX/B-12) TABS Take 1 tablet by mouth daily.      Historical Provider, MD  bumetanide (BUMEX) 1 MG  tablet Take 1 mg by mouth as needed.      Historical Provider, MD  Calcium Carbonate (CALCIUM 600 PO) Take 1 tablet by mouth daily.      Historical Provider, MD  calcium-vitamin D (OSCAL WITH D) 500-200 MG-UNIT per tablet Take 1 tablet by mouth daily.      Historical Provider, MD  ezetimibe (ZETIA) 10 MG tablet Take 1 tablet (10 mg total) by mouth daily. 10/30/12   Burtis Junes, NP  fish oil-omega-3 fatty acids 1000 MG capsule Take 1 g by mouth daily.      Historical Provider, MD  lisinopril (PRINIVIL,ZESTRIL) 40 MG tablet Take 40 mg by mouth daily.      Historical Provider, MD  Multiple Vitamin (MULTIVITAMIN) tablet Take 1 tablet by mouth daily.      Historical Provider, MD  Multiple Vitamin (VIT E-VIT C-BETA CAROTENE PO) Take 1 tablet by mouth daily.      Historical Provider, MD   Review of Systems  Skin: Positive for wound.  Neurological: Negative for dizziness, syncope and light-headedness.       Objective:   Physical Exam CONSTITUTIONAL: Well developed/well nourished  HEAD: Normocephalic/atraumatic EYES: EOMI/PERRL ENMT: Mucous membranes moist NECK: supple no meningeal signs SPINE:entire spine nontender CV: S1/S2 noted, no murmurs/rubs/gallops noted LUNGS: Lungs are clear to auscultation bilaterally, no apparent distress ABDOMEN: soft, nontender, no rebound or guarding GU:no cva tenderness NEURO: Pt is awake/alert, moves all extremitiesx4 EXTREMITIES: pulses normal, full ROM SKIN: warm, color normal PSYCH: no abnormalities of mood noted     Assessment & Plan:  Patient did have a vagal episode during the procedure. This responded to laying her down flat propping her legs up. Vital signs at time of discharge her back to within normal limits.  I personally performed the services described in this documentation, which was scribed in my presence. The recorded information has been reviewed and is accurate.

## 2013-12-26 NOTE — Progress Notes (Signed)
   Patient ID: ETHLEEN LORMAND MRN: [id number], DOB: 30-Oct-1928, 80 y.o. Date of Encounter: 12/26/2013, 7:04 PM   PROCEDURE NOTE: Verbal consent obtained.  Risks and benefits of the procedure were explained. Patient made an informed decision to proceed with the procedure. Sterile technique employed. Numbing: Anesthesia obtained with 2% lidocaine with epi 5 cc for local anesthesia.   Cleansed with soap and water. Irrigated. Betadine prep per usual protocol.  Wound explored, no deep structures involved, no foreign bodies.   Wound repaired with # 1 HM and 14 SI of 5-0 Prolene.  Hemostasis obtained. Wound cleansed and dressed.  Wound care instructions including precautions covered with patient. Handout given.  Anticipate suture removal in 7 days.   Signed, Christell Faith, MHS, PA-C [facility] [place], [place] [address] [facility] 12/26/2013 7:04 PM

## 2014-01-02 ENCOUNTER — Ambulatory Visit (INDEPENDENT_AMBULATORY_CARE_PROVIDER_SITE_OTHER): Payer: Medicare Other | Admitting: Physician Assistant

## 2014-01-02 VITALS — BP 132/76 | Temp 98.5°F

## 2014-01-02 DIAGNOSIS — Z4802 Encounter for removal of sutures: Secondary | ICD-10-CM

## 2014-01-02 NOTE — Progress Notes (Signed)
   Subjective:    Patient ID: Erika [name], female    DOB: 1928-10-02, 80 y.o.   MRN: [id number]  Suture / Staple Removal     [name] is a very pleasant 80 yr old female here for removal of sutures that were placed here 12/26/13.  Pt reports she is doing very well.  No pain or drainage.      Review of Systems  Constitutional: Negative for fever and chills.  Respiratory: Negative.   Cardiovascular: Negative.   Skin: Positive for wound.       Objective:   Physical Exam  Vitals reviewed. Constitutional: She is oriented to person, place, and time. She appears well-developed and well-nourished. No distress.  HENT:  Head: Normocephalic and atraumatic.  Eyes: Conjunctivae are normal. No scleral icterus.  Pulmonary/Chest: Effort normal.  Neurological: She is alert and oriented to person, place, and time.  Skin: Skin is warm and dry.  Well healed scalp laceration; #15 sutures removed without difficulty; edges very well approximated; no erythema, warmth, pain, drainage  Psychiatric: She has a normal mood and affect. Her behavior is normal.        Assessment & Plan:  Visit for suture removal   [name] is a very pleasant 80 yr old female here for removal of sutures.  Laceration is very well healed.  Sutures removed without difficulty.  Follow up if concerns.   Alonza Smoker MHS, PA-C [facility] 2/8/20159:25 PM

## 2015-01-20 ENCOUNTER — Other Ambulatory Visit (HOSPITAL_COMMUNITY): Payer: Self-pay | Admitting: *Deleted

## 2015-01-23 ENCOUNTER — Ambulatory Visit (HOSPITAL_COMMUNITY)
Admission: RE | Admit: 2015-01-23 | Discharge: 2015-01-23 | Disposition: A | Discharge status: Home / Self Care | Payer: Medicare Other | Source: Ambulatory Visit | Attending: Internal Medicine | Admitting: Internal Medicine

## 2015-01-23 DIAGNOSIS — M810 Age-related osteoporosis without current pathological fracture: Secondary | ICD-10-CM | POA: Insufficient documentation

## 2015-01-23 DIAGNOSIS — Z5181 Encounter for therapeutic drug level monitoring: Secondary | ICD-10-CM | POA: Diagnosis not present

## 2015-01-23 MED ORDER — DENOSUMAB 60 MG/ML ~~LOC~~ SOLN
60.0000 mg | Freq: Once | SUBCUTANEOUS | Status: AC
  Administered 2015-01-23: 60 mg via SUBCUTANEOUS
  Filled 2015-01-23: qty 1

## 2015-02-06 ENCOUNTER — Other Ambulatory Visit (HOSPITAL_COMMUNITY): Payer: Self-pay | Admitting: Cardiology

## 2015-02-06 ENCOUNTER — Ambulatory Visit (INDEPENDENT_AMBULATORY_CARE_PROVIDER_SITE_OTHER): Payer: Medicare Other

## 2015-02-06 ENCOUNTER — Ambulatory Visit (HOSPITAL_COMMUNITY)
Admission: RE | Admit: 2015-02-06 | Discharge: 2015-02-06 | Disposition: A | Discharge status: Home / Self Care | Payer: Medicare Other | Source: Ambulatory Visit | Attending: Surgery | Admitting: Surgery

## 2015-02-06 ENCOUNTER — Ambulatory Visit (INDEPENDENT_AMBULATORY_CARE_PROVIDER_SITE_OTHER): Payer: Medicare Other | Admitting: Emergency Medicine

## 2015-02-06 ENCOUNTER — Other Ambulatory Visit: Payer: Self-pay | Admitting: Emergency Medicine

## 2015-02-06 VITALS — BP 150/68 | HR 70 | Temp 98.3°F | Resp 16 | Ht 60.5 in | Wt 139.0 lb

## 2015-02-06 DIAGNOSIS — R609 Edema, unspecified: Secondary | ICD-10-CM | POA: Diagnosis not present

## 2015-02-06 LAB — POCT CBC
Granulocyte percent: 77.2 %G (ref 37–80)
HEMATOCRIT: 38.9 % (ref 37.7–47.9)
HEMOGLOBIN: 12.1 g/dL — AB (ref 12.2–16.2)
LYMPH, POC: 1.5 (ref 0.6–3.4)
MCH: 28.7 pg (ref 27–31.2)
MCHC: 31.2 g/dL — AB (ref 31.8–35.4)
MCV: 91.9 fL (ref 80–97)
MID (cbc): 0.3 (ref 0–0.9)
MPV: 6.6 fL (ref 0–99.8)
POC Granulocyte: 6.1 (ref 2–6.9)
POC LYMPH %: 18.4 % (ref 10–50)
POC MID %: 4.4 % (ref 0–12)
Platelet Count, POC: 317 10*3/uL (ref 142–424)
RBC: 4.23 M/uL (ref 4.04–5.48)
RDW, POC: 15.3 %
WBC: 7.9 10*3/uL (ref 4.6–10.2)

## 2015-02-06 LAB — COMPREHENSIVE METABOLIC PANEL
ALT: 9 U/L (ref 0–35)
AST: 23 U/L (ref 0–37)
Albumin: 2.6 g/dL — ABNORMAL LOW (ref 3.5–5.2)
Alkaline Phosphatase: 61 U/L (ref 39–117)
BUN: 15 mg/dL (ref 6–23)
CALCIUM: 8.7 mg/dL (ref 8.4–10.5)
CHLORIDE: 99 meq/L (ref 96–112)
CO2: 28 mEq/L (ref 19–32)
CREATININE: 0.71 mg/dL (ref 0.50–1.10)
Glucose, Bld: 103 mg/dL — ABNORMAL HIGH (ref 70–99)
POTASSIUM: 4.7 meq/L (ref 3.5–5.3)
Sodium: 135 mEq/L (ref 135–145)
Total Bilirubin: 0.6 mg/dL (ref 0.2–1.2)
Total Protein: 5.9 g/dL — ABNORMAL LOW (ref 6.0–8.3)

## 2015-02-06 LAB — D-DIMER, QUANTITATIVE (NOT AT ARMC): D DIMER QUANT: 3.62 ug{FEU}/mL — AB (ref 0.00–0.48)

## 2015-02-06 NOTE — Progress Notes (Signed)
Preliminary report called and given to cathy at Dr. Thurman Coyer office.

## 2015-02-06 NOTE — Progress Notes (Signed)
[facility] [address] [place] [address] [phone]  Date:  02/06/2015   Name:  Erika [name]   DOB:  [birth year]   MRN:  [id number]  PCP:  Precious Reel, MD    Chief Complaint: Foot Swelling   History of Present Illness:  Erika [name] is a 81 y.o. very pleasant female patient who presents with the following:  Since Friday has developed swelling in both ankles.   No change in activity, travel or immobilization. Painless  No chest pain or pressure. No shortness of breath or hemoptysis No improvement with over the counter medications or other home remedies.  Denies other complaint or health concern today.   Patient Active Problem List   Diagnosis Date Noted  . Fall 08/26/2011  . CAD (coronary artery disease) 04/26/2011  . Hyperlipemia 04/26/2011    Past Medical History  Diagnosis Date  . Hypertension   . Hyperlipidemia   . Myocardial infarction 1990  . CAD (coronary artery disease)     post MI in 81  . Osteopenia   . Hypothyroidism   . Fall Sept 2012    with left zygoma fracture  . Normal nuclear stress test July 2012    No ischemia.   . Right bundle branch block     Past Surgical History  Procedure Laterality Date  . Cardiac catheterization  10/18/1994    Single vessel disease with a severe stenosis in the RCA which had recannalized since prior study in 1990. Managed medically  . Cardiac catheterization  09/22/89  . Cataract extraction, bilateral  10/2009    History  Substance Use Topics  . Smoking status: Never Smoker   . Smokeless tobacco: Never Used  . Alcohol Use: No    Family History  Problem Relation Age of Onset  . Heart attack Father 20  . Heart attack Mother 79  . Heart disease Brother   . Heart disease Brother     No Known Allergies  Medication list has been reviewed and updated.  Current Outpatient Prescriptions on File Prior to Visit  Medication Sig Dispense Refill  . amLODipine (NORVASC) 5 MG  tablet Take 5 mg by mouth daily.      [name] aspirin 325 MG tablet Take 81 mg by mouth daily.     [name] atorvastatin (LIPITOR) 20 MG tablet Take 20 mg by mouth daily.      . B Complex Vitamins (B-COMPLEX/B-12) TABS Take 1 tablet by mouth daily.      [name] ezetimibe (ZETIA) 10 MG tablet Take 1 tablet (10 mg total) by mouth daily. 15 tablet 0  . fish oil-omega-3 fatty acids 1000 MG capsule Take 1 g by mouth daily.      [name] lisinopril (PRINIVIL,ZESTRIL) 40 MG tablet Take 40 mg by mouth daily.      . Multiple Vitamin (MULTIVITAMIN) tablet Take 1 tablet by mouth daily.      . Multiple Vitamin (VIT E-VIT C-BETA CAROTENE PO) Take 1 tablet by mouth daily.      [name] alendronate (FOSAMAX) 70 MG tablet Take 70 mg by mouth every 30 (thirty) days. Take with a full glass of water on an empty stomach.     . bumetanide (BUMEX) 1 MG tablet Take 1 mg by mouth as needed.      . Calcium Carbonate (CALCIUM 600 PO) Take 1 tablet by mouth daily.      . calcium-vitamin D (OSCAL WITH D) 500-200 MG-UNIT per tablet Take 1  tablet by mouth daily.       No current facility-administered medications on file prior to visit.    Review of Systems:  As per HPI, otherwise negative.    Physical Examination: Filed Vitals:   02/06/15 0840  BP: 150/68  Pulse: 70  Temp: 98.3 F (36.8 C)  Resp: 16   Filed Vitals:   02/06/15 0840  Height: 5' 0.5" (1.537 m)  Weight: 139 lb (63.05 kg)   Body mass index is 26.69 kg/(m^2). Ideal Body Weight: Weight in (lb) to have BMI = 25: 129.9  GEN: WDWN, NAD, Non-toxic, A & O x 3 HEENT: Atraumatic, Normocephalic. Neck supple. No masses, No LAD. Ears and Nose: No external deformity. CV: RRR, No M/G/R. No JVD. No thrill. No extra heart sounds. PULM: CTA B, no wheezes, crackles, rhonchi. No retractions. No resp. distress. No accessory muscle use. ABD: S, NT, ND, +BS. No rebound. No HSM. EXTR: No c/c  Bilateral 2+ edema to mid calf NEURO Normal gait.  PSYCH: Normally interactive. Conversant. Not  depressed or anxious appearing.  Calm demeanor.    Assessment and Plan: DVT Dr Wynonia Lawman has ordered venous doppler  Follow up with Dr Wynonia Lawman this week  Signed,  Ellison Carwin, MD   [facility] reading (PRIMARY) by  Dr. Ouida Sills.  No acute disease.

## 2015-02-09 ENCOUNTER — Telehealth: Payer: Self-pay | Admitting: Radiology

## 2015-02-09 NOTE — Telephone Encounter (Signed)
Pt calling about lab results. They have not been reviewed yet.

## 2015-02-17 ENCOUNTER — Ambulatory Visit (INDEPENDENT_AMBULATORY_CARE_PROVIDER_SITE_OTHER): Payer: Medicare Other | Admitting: Internal Medicine

## 2015-02-17 VITALS — BP 140/60 | HR 69 | Temp 97.9°F | Resp 17 | Ht 60.5 in | Wt 146.6 lb

## 2015-02-17 DIAGNOSIS — R609 Edema, unspecified: Secondary | ICD-10-CM

## 2015-02-17 LAB — POCT URINALYSIS DIPSTICK
Bilirubin, UA: NEGATIVE
GLUCOSE UA: NEGATIVE
KETONES UA: NEGATIVE
Nitrite, UA: NEGATIVE
Spec Grav, UA: 1.02
Urobilinogen, UA: 0.2
pH, UA: 6.5

## 2015-02-17 NOTE — Progress Notes (Signed)
Subjective:  This chart was scribed for Erika Lin, MD by Molli Posey, Medical scribe. This patient was seen in ROOM 3 and the patient's care was started 4:12 PM.  Patient ID: Erika [name], female    DOB: 28-Apr-1928, 81 y.o.   MRN: [id number]   Chief Complaint  Patient presents with   Foot Swelling    x 2 weeks   Leg Swelling    x 2 weeks   HPI HPI Comments: Erika [name] is a 81 y.o. female with a history of CAD, HTN and MI who presents to [facility] complaining of foot swelling for the last 2 weeks. She states that her swelling started in her feet and then progressed to her legs. Pt states that she takes fluid pills which typically relieve her symptoms but have not for her current episode. She reports that she was given Prolia medication a few weeks ago and thinks her swelling may be attributing to that. She has had this injection twice before without a similar problem. Pt reports that her swelling has not been getting better and that when she wakes up her swelling is still present. Her eyes have not been more swollen than normal throughout this episode.  She recently had a cardiac and respiratory workup and states that both were normal. Pt states that she is in no pain. She has no numbness in her lower extremities or weakness in her legs. No changes in fluid or food intake recently.  Since the onset of this problem she has been evaluated by her cardiologist Dr. Wynonia Lawman who prescribed Lasix after determining that she does not have heart failure or an acute cardiac problem. Lasix seems to be having no effect. Doppler ultrasound lower extremities was also normal. Metabolic profile 87/86/7672 was revealing for a low total protein and a low serum albumin. These studies were normal in 2010, and there are no other values in the chart to compare   Patient Active Problem List   Diagnosis Date Noted   CAD (coronary artery disease) 04/26/2011   Hyperlipemia 04/26/2011   Past Medical  History  Diagnosis Date   Hypertension    Hyperlipidemia    Myocardial infarction 1990   CAD (coronary artery disease)     post MI in 1990   Osteopenia    Hypothyroidism    Fall Sept 2012    with left zygoma fracture   Normal nuclear stress test July 2012    No ischemia.    Right bundle branch block    Current Outpatient Prescriptions on File Prior to Visit  Medication Sig Dispense Refill   alendronate (FOSAMAX) 70 MG tablet Take 70 mg by mouth every 30 (thirty) days. Take with a full glass of water on an empty stomach.      amLODipine (NORVASC) 5 MG tablet Take 5 mg by mouth daily.       aspirin 325 MG tablet Take 81 mg by mouth daily.      atorvastatin (LIPITOR) 20 MG tablet Take 20 mg by mouth daily.       B Complex Vitamins (B-COMPLEX/B-12) TABS Take 1 tablet by mouth daily.       Calcium Carbonate (CALCIUM 600 PO) Take 1 tablet by mouth daily.       calcium-vitamin D (OSCAL WITH D) 500-200 MG-UNIT per tablet Take 1 tablet by mouth daily.       ezetimibe (ZETIA) 10 MG tablet Take 1 tablet (10 mg total) by mouth daily. 15 tablet 0  fish oil-omega-3 fatty acids 1000 MG capsule Take 1 g by mouth daily.       lisinopril (PRINIVIL,ZESTRIL) 40 MG tablet Take 40 mg by mouth daily.       Misc Natural Products (OSTEO BI-FLEX ADV JOINT SHIELD PO) Take by mouth.     Multiple Vitamin (MULTIVITAMIN) tablet Take 1 tablet by mouth daily.       Multiple Vitamin (VIT E-VIT C-BETA CAROTENE PO) Take 1 tablet by mouth daily.       bumetanide (BUMEX) 1 MG tablet Take 1 mg by mouth as needed.       No current facility-administered medications on file prior to visit.   No Known Allergies Past Surgical History  Procedure Laterality Date   Cardiac catheterization  10/18/1994    Single vessel disease with a severe stenosis in the RCA which had recannalized since prior study in 1990. Managed medically   Cardiac catheterization  09/22/89   Cataract extraction, bilateral   10/2009     Review of Systems  Constitutional: Negative for fatigue and unexpected weight change.  HENT: Negative for facial swelling.   Respiratory: Negative for cough and shortness of breath.   Cardiovascular: Negative for chest pain and palpitations.  Genitourinary: Negative for difficulty urinating.  Skin: Negative for rash.  All other systems reviewed and are negative.      Objective:   Physical Exam  Constitutional: She is oriented to person, place, and time. She appears well-developed and well-nourished. No distress.  HENT:  Head: Normocephalic and atraumatic.  Eyes: Conjunctivae and EOM are normal. Pupils are equal, round, and reactive to light.  Neck: Neck supple. No thyromegaly present.  Cardiovascular: Normal rate, regular rhythm and intact distal pulses.   Murmur heard. 2/6 systolic murmur.   Pulmonary/Chest: Effort normal and breath sounds normal. No respiratory distress. She has no wheezes. She has no rales.  Abdominal: Soft. She exhibits no distension and no mass. There is no tenderness.  No organomegaly.   Musculoskeletal: She exhibits edema.  Extremities 3+ pitting edema to the level of the knee bilaterally. Without redness or tenderness.   Neurological: She is alert and oriented to person, place, and time. No cranial nerve deficit.  Psychiatric: She has a normal mood and affect. Her behavior is normal. Judgment and thought content normal.  Nursing note and vitals reviewed.  Filed Vitals:   02/17/15 1601  BP: 140/60  Pulse: 69  Temp: 97.9 F (36.6 C)  TempSrc: Oral  Resp: 17  Height: 5' 0.5" (1.537 m)  Weight: 146 lb 9.6 oz (66.497 kg)  SpO2: 95%   Results for orders placed or performed in visit on 02/17/15  POCT urinalysis dipstick  Result Value Ref Range   Color, UA yellow    Clarity, UA clear    Glucose, UA neg    Bilirubin, UA neg    Ketones, UA neg    Spec Grav, UA 1.020    Blood, UA mod    pH, UA 6.5    Protein, UA >=300    Urobilinogen,  UA 0.2    Nitrite, UA neg    Leukocytes, UA small (1+)        Assessment & Plan:   I have completed the patient encounter in its entirety as documented by the scribe, with editing by me where necessary. Robert P. Laney Pastor, M.D.  Edema - Hypoproteinemia Proteinuria  Lab  Sent: TSH,BMET  The remaining etiologies to consider would be nephrotic syndrome, drug induced edema from Prolia  plus calcium channel blocker, and hypothyroidism. She does not appear malnourished. Nose no evidence of liver disease. There are no symptoms to suggest protein-losing enteropathy. She has no ascites that I can feel.  We will call with labs and plan for follow-up

## 2015-02-18 LAB — BASIC METABOLIC PANEL
BUN: 23 mg/dL (ref 6–23)
CO2: 26 mEq/L (ref 19–32)
Calcium: 7.3 mg/dL — ABNORMAL LOW (ref 8.4–10.5)
Chloride: 95 mEq/L — ABNORMAL LOW (ref 96–112)
Creat: 0.96 mg/dL (ref 0.50–1.10)
GLUCOSE: 88 mg/dL (ref 70–99)
Potassium: 4.4 mEq/L (ref 3.5–5.3)
Sodium: 130 mEq/L — ABNORMAL LOW (ref 135–145)

## 2015-02-18 LAB — TSH: TSH: 13.468 u[IU]/mL — ABNORMAL HIGH (ref 0.350–4.500)

## 2015-02-18 LAB — T4, FREE: Free T4: 0.62 ng/dL — ABNORMAL LOW (ref 0.80–1.80)

## 2015-02-19 ENCOUNTER — Encounter: Payer: Self-pay | Admitting: Internal Medicine

## 2015-02-19 DIAGNOSIS — E039 Hypothyroidism, unspecified: Secondary | ICD-10-CM | POA: Insufficient documentation

## 2015-02-19 DIAGNOSIS — E778 Other disorders of glycoprotein metabolism: Secondary | ICD-10-CM | POA: Insufficient documentation

## 2015-02-20 NOTE — Telephone Encounter (Signed)
Patient presented to clinic to obtain copies of her most recent labs.  Printed copies and reviewed with patient.

## 2015-02-23 ENCOUNTER — Encounter: Payer: Self-pay | Admitting: Radiology

## 2015-02-23 ENCOUNTER — Telehealth: Payer: Self-pay | Admitting: Radiology

## 2015-02-23 NOTE — Telephone Encounter (Signed)
Pt called back and I gave her lab results. She has already seen Dr Virgina Jock and she is on the right track. She has stopped her stopped the Lasix.

## 2015-03-07 ENCOUNTER — Emergency Department (HOSPITAL_COMMUNITY): Payer: Medicare Other

## 2015-03-07 ENCOUNTER — Encounter (HOSPITAL_COMMUNITY): Payer: Self-pay | Admitting: Emergency Medicine

## 2015-03-07 ENCOUNTER — Emergency Department (HOSPITAL_COMMUNITY)
Admission: EM | Admit: 2015-03-07 | Discharge: 2015-03-07 | Disposition: A | Discharge status: Home / Self Care | Payer: Medicare Other | Attending: Emergency Medicine | Admitting: Emergency Medicine

## 2015-03-07 DIAGNOSIS — M8530 Osteitis condensans, unspecified site: Secondary | ICD-10-CM | POA: Insufficient documentation

## 2015-03-07 DIAGNOSIS — R600 Localized edema: Secondary | ICD-10-CM | POA: Diagnosis not present

## 2015-03-07 DIAGNOSIS — Z87828 Personal history of other (healed) physical injury and trauma: Secondary | ICD-10-CM | POA: Diagnosis not present

## 2015-03-07 DIAGNOSIS — E785 Hyperlipidemia, unspecified: Secondary | ICD-10-CM | POA: Insufficient documentation

## 2015-03-07 DIAGNOSIS — Z9889 Other specified postprocedural states: Secondary | ICD-10-CM | POA: Diagnosis not present

## 2015-03-07 DIAGNOSIS — Z79899 Other long term (current) drug therapy: Secondary | ICD-10-CM | POA: Insufficient documentation

## 2015-03-07 DIAGNOSIS — I252 Old myocardial infarction: Secondary | ICD-10-CM | POA: Insufficient documentation

## 2015-03-07 DIAGNOSIS — I10 Essential (primary) hypertension: Secondary | ICD-10-CM | POA: Diagnosis not present

## 2015-03-07 DIAGNOSIS — I2510 Atherosclerotic heart disease of native coronary artery without angina pectoris: Secondary | ICD-10-CM | POA: Diagnosis not present

## 2015-03-07 DIAGNOSIS — E039 Hypothyroidism, unspecified: Secondary | ICD-10-CM | POA: Insufficient documentation

## 2015-03-07 DIAGNOSIS — R0989 Other specified symptoms and signs involving the circulatory and respiratory systems: Secondary | ICD-10-CM | POA: Diagnosis not present

## 2015-03-07 DIAGNOSIS — R0602 Shortness of breath: Secondary | ICD-10-CM | POA: Diagnosis present

## 2015-03-07 LAB — CBC
HCT: 36 % (ref 36.0–46.0)
Hemoglobin: 11.9 g/dL — ABNORMAL LOW (ref 12.0–15.0)
MCH: 30 pg (ref 26.0–34.0)
MCHC: 33.1 g/dL (ref 30.0–36.0)
MCV: 90.7 fL (ref 78.0–100.0)
Platelets: 342 10*3/uL (ref 150–400)
RBC: 3.97 MIL/uL (ref 3.87–5.11)
RDW: 14.4 % (ref 11.5–15.5)
WBC: 8.2 10*3/uL (ref 4.0–10.5)

## 2015-03-07 LAB — COMPREHENSIVE METABOLIC PANEL
ALT: 11 U/L (ref 0–35)
ANION GAP: 7 (ref 5–15)
AST: 22 U/L (ref 0–37)
Albumin: 1.1 g/dL — ABNORMAL LOW (ref 3.5–5.2)
Alkaline Phosphatase: 62 U/L (ref 39–117)
BUN: 27 mg/dL — ABNORMAL HIGH (ref 6–23)
CO2: 29 mmol/L (ref 19–32)
CREATININE: 1.06 mg/dL (ref 0.50–1.10)
Calcium: 8.5 mg/dL (ref 8.4–10.5)
Chloride: 100 mmol/L (ref 96–112)
GFR, EST AFRICAN AMERICAN: 54 mL/min — AB (ref >90)
GFR, EST NON AFRICAN AMERICAN: 47 mL/min — AB (ref >90)
GLUCOSE: 116 mg/dL — AB (ref 70–99)
Potassium: 5.2 mmol/L — ABNORMAL HIGH (ref 3.5–5.1)
SODIUM: 136 mmol/L (ref 135–145)
Total Bilirubin: 0.2 mg/dL — ABNORMAL LOW (ref 0.3–1.2)
Total Protein: 4.9 g/dL — ABNORMAL LOW (ref 6.0–8.3)

## 2015-03-07 LAB — I-STAT TROPONIN, ED: Troponin i, poc: 0.01 ng/mL (ref 0.00–0.08)

## 2015-03-07 LAB — BRAIN NATRIURETIC PEPTIDE: B Natriuretic Peptide: 163.8 pg/mL — ABNORMAL HIGH (ref 0.0–100.0)

## 2015-03-07 MED ORDER — FUROSEMIDE 10 MG/ML IJ SOLN
40.0000 mg | Freq: Once | INTRAMUSCULAR | Status: AC
  Administered 2015-03-07: 40 mg via INTRAMUSCULAR
  Filled 2015-03-07: qty 4

## 2015-03-07 NOTE — ED Notes (Signed)
Bilateral leg swelling and venous stasus. Been going on for 5 weeks. Doesn't see cardiologist for weeks and wanted to get evaluation. Had cardiac workup on the 21st. Echo, EKG, lab work and doppler of legs for clots and chest xray.

## 2015-03-07 NOTE — ED Provider Notes (Signed)
CSN: [id number]     Arrival date & time 03/07/15  9381 History   First MD Initiated Contact with Patient 03/07/15 [phone]     Chief Complaint  Patient presents with  . Shortness of Breath     (Consider location/radiation/quality/duration/timing/severity/associated sxs/prior Treatment) HPI Comments: Patient is an 81 year old female with history of hypertension, high cholesterol. She presents with a five-week history of increasing leg swelling. She feels somewhat short of breath with exertion but denies orthopnea. She denies fevers or chills. She denies chest pain or cough. She has been evaluated by her primary doctor and cardiologist for this complaint and has undergone laboratory studies, echocardiogram, and other tests, however no cause has been found. She did have an increase of her Lasix dosage from 20 mg once daily to twice daily.  Patient is a 81 y.o. female presenting with shortness of breath. The history is provided by the patient.  Shortness of Breath Severity:  Mild Onset quality:  Gradual Duration:  5 weeks Timing:  Constant Progression:  Worsening Chronicity:  New Context: activity   Relieved by:  Nothing Worsened by:  Nothing tried Ineffective treatments:  None tried Associated symptoms: no chest pain and no fever     Past Medical History  Diagnosis Date  . Hypertension   . Hyperlipidemia   . Myocardial infarction 1990  . CAD (coronary artery disease)     post MI in 81  . Osteopenia   . Hypothyroidism   . Fall Sept 2012    with left zygoma fracture  . Normal nuclear stress test July 2012    No ischemia.   . Right bundle branch block    Past Surgical History  Procedure Laterality Date  . Cardiac catheterization  10/18/1994    Single vessel disease with a severe stenosis in the RCA which had recannalized since prior study in 1990. Managed medically  . Cardiac catheterization  09/22/89  . Cataract extraction, bilateral  10/2009   Family History  Problem  Relation Age of Onset  . Heart attack Father 78  . Heart attack Mother 84  . Heart disease Brother   . Heart disease Brother    History  Substance Use Topics  . Smoking status: Never Smoker   . Smokeless tobacco: Never Used  . Alcohol Use: No   OB History    No data available     Review of Systems  Constitutional: Negative for fever.  Respiratory: Positive for shortness of breath.   Cardiovascular: Negative for chest pain.  All other systems reviewed and are negative.     Allergies  Review of patient's allergies indicates no known allergies.  Home Medications   Prior to Admission medications   Medication Sig Start Date End Date Taking? Authorizing Provider  amLODipine (NORVASC) 5 MG tablet Take 5 mg by mouth daily.     Yes Historical Provider, MD  atorvastatin (LIPITOR) 20 MG tablet Take 20 mg by mouth daily.     Yes Historical Provider, MD  B Complex Vitamins (B-COMPLEX/B-12) TABS Take 1 tablet by mouth daily.     Yes Historical Provider, MD  denosumab (PROLIA) 60 MG/ML SOLN injection Inject 60 mg into the skin every 6 (six) months. Administer in upper arm, thigh, or abdomen *pt had this administered last 02/20/15   Yes Historical Provider, MD  ezetimibe (ZETIA) 10 MG tablet Take 1 tablet (10 mg total) by mouth daily. 10/30/12  Yes Burtis Junes, NP  furosemide (LASIX) 40 MG tablet Take 40 mg  by mouth daily.   Yes Historical Provider, MD  levothyroxine (SYNTHROID, LEVOTHROID) 25 MCG tablet Take 25 mcg by mouth daily before breakfast.   Yes Historical Provider, MD  lisinopril (PRINIVIL,ZESTRIL) 40 MG tablet Take 40 mg by mouth daily.     Yes Historical Provider, MD  calcium-vitamin D (OSCAL WITH D) 500-200 MG-UNIT per tablet Take 1 tablet by mouth daily.      Historical Provider, MD   BP 171/72 mmHg  Pulse 78  Temp(Src) 97.6 F (36.4 C) (Oral)  Resp 18  Wt 154 lb 7 oz (70.052 kg)  SpO2 98% Physical Exam  Constitutional: She is oriented to person, place, and time. She  appears well-developed and well-nourished. No distress.  HENT:  Head: Normocephalic and atraumatic.  Neck: Normal range of motion. Neck supple.  Cardiovascular: Normal rate and regular rhythm.  Exam reveals no gallop and no friction rub.   No murmur heard. Pulmonary/Chest: Effort normal. No respiratory distress. She has no wheezes. She has rales.  There are rales in the bases bilaterally.  Abdominal: Soft. Bowel sounds are normal. She exhibits no distension. There is no tenderness.  Musculoskeletal: Normal range of motion.  Neurological: She is alert and oriented to person, place, and time.  Skin: Skin is warm and dry. She is not diaphoretic.  Nursing note and vitals reviewed.   ED Course  Procedures (including critical care time) Labs Review Labs Reviewed  CBC - Abnormal; Notable for the following:    Hemoglobin 11.9 (*)    All other components within normal limits  COMPREHENSIVE METABOLIC PANEL - Abnormal; Notable for the following:    Potassium 5.2 (*)    Glucose, Bld 116 (*)    BUN 27 (*)    Total Protein 4.9 (*)    Albumin 1.1 (*)    Total Bilirubin 0.2 (*)    GFR calc non Af Amer 47 (*)    GFR calc Af Amer 54 (*)    All other components within normal limits  BRAIN NATRIURETIC PEPTIDE - Abnormal; Notable for the following:    B Natriuretic Peptide 163.8 (*)    All other components within normal limits  I-STAT TROPOININ, ED    Imaging Review Dg Chest Portable 1 View  03/07/2015   CLINICAL DATA:  Bilateral lower extremity edema; shortness of breath  EXAM: PORTABLE CHEST - 1 VIEW  COMPARISON:  February 06, 2015  FINDINGS: There is scarring in the left base region. There is no edema or consolidation. Heart size and pulmonary vascularity are normal. No adenopathy. No bone lesions.  IMPRESSION: Scarring left base.  No edema or consolidation.   Electronically Signed   By: Lowella Grip III M.D.   On: 03/07/2015 11:15     EKG Interpretation   Date/Time:  Tuesday March 07 2015 09:34:47 EDT Ventricular Rate:  92 PR Interval:  138 QRS Duration: 132 QT Interval:  366 QTC Calculation: 452 R Axis:   -37 Text Interpretation:  Normal sinus rhythm Indeterminate axis Right bundle  branch block Abnormal ECG Confirmed by DELOS  MD, Lilliam Chamblee ([id number]) on  03/07/2015 1:04:11 PM      MDM   Final diagnoses:  None    Patient is an 81 year old female who presents with leg swelling. She has had extensive outpatient workup including bilateral leg ultrasounds, echocardiogram, laboratory studies, however all of these have been unremarkable. She presents today with continued swelling. Her laboratory studies today reveal normal renal function and her BNP is only slightly elevated.  Her chest x-ray does not reveal CHF. I am uncertain as to the etiology of her edema, however I see nothing that appears emergent or in need of admission. She will be given an IM dose of Lasix and discharged home on an increased dose of Lasix. She currently takes 20 mg twice a day and this will be increased to 40 mg twice a day. She has a follow-up appointment next week with Dr. Virgina Jock and I have advised her to keep this appointment.    Veryl Speak, MD 03/07/15 [phone]

## 2015-03-07 NOTE — Discharge Instructions (Signed)
Increase your Lasix to 40 mg twice daily until followed up by Dr. Hebert Soho.  Return to the emergency department for chest pain, worsening breathing, high fever, or other new and concerning symptoms.   Peripheral Edema You have swelling in your legs (peripheral edema). This swelling is due to excess accumulation of salt and water in your body. Edema may be a sign of heart, kidney or liver disease, or a side effect of a medication. It may also be due to problems in the leg veins. Elevating your legs and using special support stockings may be very helpful, if the cause of the swelling is due to poor venous circulation. Avoid long periods of standing, whatever the cause. Treatment of edema depends on identifying the cause. Chips, pretzels, pickles and other salty foods should be avoided. Restricting salt in your diet is almost always needed. Water pills (diuretics) are often used to remove the excess salt and water from your body via urine. These medicines prevent the kidney from reabsorbing sodium. This increases urine flow. Diuretic treatment may also result in lowering of potassium levels in your body. Potassium supplements may be needed if you have to use diuretics daily. Daily weights can help you keep track of your progress in clearing your edema. You should call your caregiver for follow up care as recommended. SEEK IMMEDIATE MEDICAL CARE IF:   You have increased swelling, pain, redness, or heat in your legs.  You develop shortness of breath, especially when lying down.  You develop chest or abdominal pain, weakness, or fainting.  You have a fever. Document Released: 12/19/2004 Document Revised: 02/03/2012 Document Reviewed: 11/29/2009 [facility] Patient Information 2015 [place], [place]. This information is not intended to replace advice given to you by your health care provider. Make sure you discuss any questions you have with your health care provider.

## 2015-03-13 ENCOUNTER — Telehealth: Payer: Self-pay | Admitting: Internal Medicine

## 2015-03-13 DIAGNOSIS — R609 Edema, unspecified: Secondary | ICD-10-CM

## 2015-03-13 DIAGNOSIS — N049 Nephrotic syndrome with unspecified morphologic changes: Secondary | ICD-10-CM

## 2015-03-13 NOTE — Progress Notes (Signed)
Sxs= eyes are swollen, diarrhea, trouble breathing, pain right side of head.  She was unable to make appt today at 9:30 am She does not have anaphylaxis but may be possibility:  But Sxs are lump in throat, wheezing, trouble breathing, weak and rapid pulse, N/V or diarrhea, hives with itching skin  Her Edema and issues are > 5 weeks and progressive Initially started and lasix pushed up per Dr Wynonia Lawman and myself. Synthroid started as TSH > 12. Currently - I believe she has Nephrotic Syndrome.  Albumin and Protein levels are very low.  She had a UA that dipped strongly for protein @ 2 weeks ago  The nephrotic syndrome is defined by the presence of heavy proteinuria (protein excretion greater than 3.5 g/24 hours in an adult), hypoalbuminemia (less than 3 g/dL), and peripheral edema. Hyperlipidemia and thrombotic disease may be present.   The predominant cause of the nephrotic syndrome in children is minimal change disease. Approximately 30 percent of adults with the nephrotic syndrome have a systemic disease such as diabetes mellitus, amyloidosis, or systemic lupus erythematosus; the remaining cases are usually due to primary disorders including minimal change disease, focal segmental glomerulosclerosis (FSGS), and membranous nephropathy. Heavy proteinuria in patients without edema or hypoalbuminemia is more likely to be due to secondary FSGS. (See 'Etiology' above.)  Proteinuria and edema are the principal clinical manifestations of the nephrotic syndrome. Other manifestations include protein malnutrition, hypovolemia, acute renal failure, urinary loss of hormones, hyperlipidemia and the potential for accelerated atherosclerosis, a tendency to venous and/or arterial thromboses and pulmonary embolism, and increased susceptibility to infection.   Proteinuria is due to increased filtration of macromolecules across the glomerular capillary wall. Albumin is the principal urinary protein, but other plasma  proteins including clotting inhibitors, transferrin, and hormone carrying proteins such as vitamin D-binding protein may be lost as well.   The etiology of heavy proteinuria may be suggested from the history and physical. In most adults, however, a renal biopsy is required to establish the diagnosis.  Treatment includes the administration of an angiotensin-converting enzyme (ACE) inhibitor or angiotensin receptor blockers (ARBs) to lower intraglomerular pressure, and dietary sodium restriction and loop diuretics to slowly reduce edema.    I talked to pt = Precious Reel MD,  March 13, 2015 1:45 PM  She is already on Lisinopril. Not Hurting Nauseated Tuesday am. She will go back to ED if gets any sicker. I mentioned to her that she probably should go now to complete w/up If she hits the ED she will need Inpatient admission She will continue to take the Synthroid All her ?s were answered She is aware she may have a cancer causing this.  ..................................Precious Reel MD  March 13, 2015 1:51 PM  Electronically signed by Precious Reel MD on 03/13/2015 at 1:53 PM

## 2015-03-17 ENCOUNTER — Encounter (HOSPITAL_COMMUNITY): Payer: Self-pay | Admitting: Emergency Medicine

## 2015-03-17 ENCOUNTER — Emergency Department (HOSPITAL_COMMUNITY): Payer: Medicare Other

## 2015-03-17 ENCOUNTER — Other Ambulatory Visit (HOSPITAL_COMMUNITY): Payer: Self-pay

## 2015-03-17 ENCOUNTER — Inpatient Hospital Stay (HOSPITAL_COMMUNITY)
Admission: EM | Admit: 2015-03-17 | Discharge: 2015-03-22 | DRG: 699 | Disposition: A | Discharge status: Home / Self Care | Payer: Medicare Other | Attending: Internal Medicine | Admitting: Internal Medicine

## 2015-03-17 DIAGNOSIS — E875 Hyperkalemia: Secondary | ICD-10-CM

## 2015-03-17 DIAGNOSIS — D509 Iron deficiency anemia, unspecified: Secondary | ICD-10-CM | POA: Diagnosis present

## 2015-03-17 DIAGNOSIS — I252 Old myocardial infarction: Secondary | ICD-10-CM

## 2015-03-17 DIAGNOSIS — E785 Hyperlipidemia, unspecified: Secondary | ICD-10-CM | POA: Diagnosis present

## 2015-03-17 DIAGNOSIS — E778 Other disorders of glycoprotein metabolism: Secondary | ICD-10-CM | POA: Diagnosis not present

## 2015-03-17 DIAGNOSIS — M8580 Other specified disorders of bone density and structure, unspecified site: Secondary | ICD-10-CM | POA: Diagnosis present

## 2015-03-17 DIAGNOSIS — I2510 Atherosclerotic heart disease of native coronary artery without angina pectoris: Secondary | ICD-10-CM | POA: Diagnosis present

## 2015-03-17 DIAGNOSIS — E039 Hypothyroidism, unspecified: Secondary | ICD-10-CM | POA: Diagnosis present

## 2015-03-17 DIAGNOSIS — R601 Generalized edema: Secondary | ICD-10-CM | POA: Diagnosis present

## 2015-03-17 DIAGNOSIS — I10 Essential (primary) hypertension: Secondary | ICD-10-CM | POA: Diagnosis present

## 2015-03-17 DIAGNOSIS — E8809 Other disorders of plasma-protein metabolism, not elsewhere classified: Secondary | ICD-10-CM | POA: Diagnosis present

## 2015-03-17 DIAGNOSIS — I4510 Unspecified right bundle-branch block: Secondary | ICD-10-CM | POA: Diagnosis present

## 2015-03-17 DIAGNOSIS — E8770 Fluid overload, unspecified: Secondary | ICD-10-CM | POA: Diagnosis present

## 2015-03-17 DIAGNOSIS — N179 Acute kidney failure, unspecified: Secondary | ICD-10-CM | POA: Diagnosis present

## 2015-03-17 DIAGNOSIS — R809 Proteinuria, unspecified: Secondary | ICD-10-CM

## 2015-03-17 DIAGNOSIS — E871 Hypo-osmolality and hyponatremia: Secondary | ICD-10-CM | POA: Diagnosis present

## 2015-03-17 DIAGNOSIS — N049 Nephrotic syndrome with unspecified morphologic changes: Secondary | ICD-10-CM | POA: Diagnosis present

## 2015-03-17 LAB — COMPREHENSIVE METABOLIC PANEL
ALBUMIN: 1.1 g/dL — AB (ref 3.5–5.2)
ALK PHOS: 59 U/L (ref 39–117)
ALT: 10 U/L (ref 0–35)
AST: 25 U/L (ref 0–37)
Anion gap: 8 (ref 5–15)
BILIRUBIN TOTAL: 0.9 mg/dL (ref 0.3–1.2)
BUN: 37 mg/dL — ABNORMAL HIGH (ref 6–23)
CHLORIDE: 98 mmol/L (ref 96–112)
CO2: 27 mmol/L (ref 19–32)
Calcium: 8.1 mg/dL — ABNORMAL LOW (ref 8.4–10.5)
Creatinine, Ser: 1.18 mg/dL — ABNORMAL HIGH (ref 0.50–1.10)
GFR calc Af Amer: 48 mL/min — ABNORMAL LOW (ref >90)
GFR calc non Af Amer: 41 mL/min — ABNORMAL LOW (ref >90)
Glucose, Bld: 90 mg/dL (ref 70–99)
POTASSIUM: 5.2 mmol/L — AB (ref 3.5–5.1)
SODIUM: 133 mmol/L — AB (ref 135–145)
Total Protein: 5 g/dL — ABNORMAL LOW (ref 6.0–8.3)

## 2015-03-17 LAB — URINE MICROSCOPIC-ADD ON

## 2015-03-17 LAB — CBC WITH DIFFERENTIAL/PLATELET
BASOS PCT: 1 % (ref 0–1)
Basophils Absolute: 0.1 10*3/uL (ref 0.0–0.1)
Eosinophils Absolute: 0.2 10*3/uL (ref 0.0–0.7)
Eosinophils Relative: 3 % (ref 0–5)
HCT: 33.5 % — ABNORMAL LOW (ref 36.0–46.0)
Hemoglobin: 11.1 g/dL — ABNORMAL LOW (ref 12.0–15.0)
LYMPHS ABS: 1.3 10*3/uL (ref 0.7–4.0)
LYMPHS PCT: 18 % (ref 12–46)
MCH: 29.8 pg (ref 26.0–34.0)
MCHC: 33.1 g/dL (ref 30.0–36.0)
MCV: 90.1 fL (ref 78.0–100.0)
MONO ABS: 0.9 10*3/uL (ref 0.1–1.0)
MONOS PCT: 12 % (ref 3–12)
NEUTROS ABS: 4.8 10*3/uL (ref 1.7–7.7)
NEUTROS PCT: 66 % (ref 43–77)
PLATELETS: 354 10*3/uL (ref 150–400)
RBC: 3.72 MIL/uL — ABNORMAL LOW (ref 3.87–5.11)
RDW: 14.5 % (ref 11.5–15.5)
WBC: 7.3 10*3/uL (ref 4.0–10.5)

## 2015-03-17 LAB — URINALYSIS, ROUTINE W REFLEX MICROSCOPIC
BILIRUBIN URINE: NEGATIVE
Glucose, UA: NEGATIVE mg/dL
Ketones, ur: NEGATIVE mg/dL
Leukocytes, UA: NEGATIVE
Nitrite: NEGATIVE
PH: 5.5 (ref 5.0–8.0)
Protein, ur: >300 mg/dL — AB
SPECIFIC GRAVITY, URINE: 1.01 (ref 1.005–1.030)
Urobilinogen, UA: 0.2 mg/dL (ref 0.0–1.0)

## 2015-03-17 LAB — TSH: TSH: 13.529 u[IU]/mL — ABNORMAL HIGH (ref 0.350–4.500)

## 2015-03-17 MED ORDER — LEVOTHYROXINE SODIUM 25 MCG PO TABS: 25.0000 ug | ORAL_TABLET | Freq: Every day | ORAL | Status: DC

## 2015-03-17 MED ORDER — ACETAMINOPHEN 325 MG PO TABS: 650.0000 mg | ORAL_TABLET | Freq: Four times a day (QID) | ORAL | Status: DC | PRN

## 2015-03-17 MED ORDER — SODIUM CHLORIDE 0.9 % IJ SOLN
3.0000 mL | Freq: Two times a day (BID) | INTRAMUSCULAR | Status: DC
  Administered 2015-03-17 – 2015-03-18 (×4): 3 mL via INTRAVENOUS

## 2015-03-17 MED ORDER — ONDANSETRON HCL 4 MG PO TABS: 4.0000 mg | ORAL_TABLET | Freq: Four times a day (QID) | ORAL | Status: DC | PRN

## 2015-03-17 MED ORDER — SODIUM CHLORIDE 0.9 % IJ SOLN
3.0000 mL | INTRAMUSCULAR | Status: DC | PRN
  Administered 2015-03-18 – 2015-03-19 (×2): 3 mL via INTRAVENOUS
  Filled 2015-03-17 (×2): qty 3

## 2015-03-17 MED ORDER — ACETAMINOPHEN 650 MG RE SUPP: 650.0000 mg | Freq: Four times a day (QID) | RECTAL | Status: DC | PRN

## 2015-03-17 MED ORDER — FUROSEMIDE 10 MG/ML IJ SOLN
40.0000 mg | Freq: Two times a day (BID) | INTRAMUSCULAR | Status: DC
  Administered 2015-03-17 – 2015-03-19 (×6): 40 mg via INTRAVENOUS
  Filled 2015-03-17 (×11): qty 4

## 2015-03-17 MED ORDER — SODIUM CHLORIDE 0.9 % IJ SOLN
3.0000 mL | Freq: Two times a day (BID) | INTRAMUSCULAR | Status: DC
  Administered 2015-03-17 – 2015-03-22 (×6): 3 mL via INTRAVENOUS

## 2015-03-17 MED ORDER — EZETIMIBE 10 MG PO TABS
10.0000 mg | ORAL_TABLET | Freq: Every day | ORAL | Status: DC
  Administered 2015-03-18 – 2015-03-22 (×5): 10 mg via ORAL
  Filled 2015-03-17 (×5): qty 1

## 2015-03-17 MED ORDER — SODIUM CHLORIDE 0.9 % IV SOLN: 250.0000 mL | INTRAVENOUS | Status: DC | PRN

## 2015-03-17 MED ORDER — ONDANSETRON HCL 4 MG/2ML IJ SOLN: 4.0000 mg | Freq: Four times a day (QID) | INTRAMUSCULAR | Status: DC | PRN

## 2015-03-17 MED ORDER — LEVOTHYROXINE SODIUM 75 MCG PO TABS
75.0000 ug | ORAL_TABLET | Freq: Every day | ORAL | Status: DC
  Administered 2015-03-18: 75 ug via ORAL
  Filled 2015-03-17 (×2): qty 1

## 2015-03-17 MED ORDER — ENOXAPARIN SODIUM 40 MG/0.4ML ~~LOC~~ SOLN
40.0000 mg | SUBCUTANEOUS | Status: DC
  Administered 2015-03-17 – 2015-03-18 (×2): 40 mg via SUBCUTANEOUS
  Filled 2015-03-17 (×3): qty 0.4

## 2015-03-17 NOTE — Consult Note (Signed)
Reason for Consult:Proteinuria and possible nephrotic syndrome Referring Physician: Tyrell Antonio, MD  Erika [name] is an 81 y.o. female.  HPI: Pt is an 81yo F with PMH sig for HTN, hyperlipidemia, CAD, and hypothyroidism who was admitted with worsening lower extremity edema which started 6 weeks ago per the patients report.  She has been refractory to oral furosemide and developed eye and lip swelling today and was admitted for further evaluation and management.  She initially had a cardiac workup with Dr. Wynonia Lawman and reports that she had a "normal" ECK and ECHO however we do not have the results of those tests.  She has also had initial workup for nephrotic syndrome started by her PCP, Dr. Shon Baton who noted significant proteinuria on Urinalysis and 24 hour urine protein of 2.5grams, low albumin, normal complements and SPEP.  She denies any use of NSAIDS/COX-II I's, but has noted bubbles in her urine.  She denies any previous history of kidney disease and also denies any family history.  We were asked to help evaluate her anasarca and proteinuria.  She is now noted to have AKI and hyperkalemia in setting of aggressive diuresis with concomitant ACE-inhibitor use.  Her trend in Scr is seen below and she admits to gaining about 40 lbs over the last 6 weeks.  Trend in Creatinine:  CREATININE, SER  Date/Time Value Ref Range Status  03/17/2015 10:00 AM 1.18* 0.50 - 1.10 mg/dL Final  03/07/2015 11:00 AM 1.06 0.50 - 1.10 mg/dL Final  02/17/2015 06:12 PM 0.96 0.50 - 1.10 mg/dL Final  02/06/2015 09:45 AM 0.71 0.50 - 1.10 mg/dL Final  06/15/2009 02:20 PM 0.72 0.4 - 1.2 mg/dL Final  12/30/2008 02:15 PM 0.72 0.4 - 1.2 mg/dL Final    PMH:   Past Medical History  Diagnosis Date  . Hypertension   . Hyperlipidemia   . Myocardial infarction 1990  . CAD (coronary artery disease)     post MI in 75  . Osteopenia   . Hypothyroidism   . Fall Sept 2012    with left zygoma fracture  . Normal nuclear stress  test July 2012    No ischemia.   . Right bundle branch block     PSH:   Past Surgical History  Procedure Laterality Date  . Cardiac catheterization  10/18/1994    Single vessel disease with a severe stenosis in the RCA which had recannalized since prior study in 1990. Managed medically  . Cardiac catheterization  09/22/89  . Cataract extraction, bilateral  10/2009    Allergies: No Known Allergies  Medications:   Prior to Admission medications   Medication Sig Start Date End Date Taking? Authorizing Provider  acetaminophen (TYLENOL) 325 MG tablet Take 650 mg by mouth every 6 (six) hours as needed for mild pain or moderate pain.   Yes Historical Provider, MD  atorvastatin (LIPITOR) 20 MG tablet Take 20 mg by mouth daily.     Yes Historical Provider, MD  denosumab (PROLIA) 60 MG/ML SOLN injection Inject 60 mg into the skin every 6 (six) months. Administer in upper arm, thigh, or abdomen *pt had this administered last 02/20/15   Yes Historical Provider, MD  ezetimibe (ZETIA) 10 MG tablet Take 1 tablet (10 mg total) by mouth daily. 10/30/12  Yes Burtis Junes, NP  furosemide (LASIX) 40 MG tablet Take 40-60 mg by mouth 2 (two) times daily. 60 mg in the morning and 40 mg in the evening   Yes Historical Provider, MD  levothyroxine (SYNTHROID,  LEVOTHROID) 25 MCG tablet Take 25 mcg by mouth daily before breakfast.   Yes Historical Provider, MD  lisinopril (PRINIVIL,ZESTRIL) 40 MG tablet Take 40 mg by mouth daily.     Yes Historical Provider, MD  levothyroxine (SYNTHROID, LEVOTHROID) 75 MCG tablet Take 75 mcg by mouth daily before breakfast.    Historical Provider, MD    Inpatient medications: . enoxaparin (LOVENOX) injection  40 mg Subcutaneous Q24H  . [START ON 03/18/2015] ezetimibe  10 mg Oral Daily  . furosemide  40 mg Intravenous BID  . [START ON 03/18/2015] levothyroxine  75 mcg Oral QAC breakfast  . sodium chloride  3 mL Intravenous Q12H  . sodium chloride  3 mL Intravenous Q12H     Discontinued Meds:   Medications Discontinued During This Encounter  Medication Reason  . amLODipine (NORVASC) 5 MG tablet Patient Preference  . B Complex Vitamins (B-COMPLEX/B-12) TABS Patient Preference  . calcium-vitamin D (OSCAL WITH D) 500-200 MG-UNIT per tablet Patient Preference  . acetaminophen (TYLENOL) tablet 462 mg Duplicate - PRN Policy  . levothyroxine (SYNTHROID, LEVOTHROID) tablet 25 mcg     Social History:  reports that she has never smoked. She has never used smokeless tobacco. She reports that she does not drink alcohol or use illicit drugs.  Family History:   Family History  Problem Relation Age of Onset  . Heart attack Father 35  . Heart attack Mother 72  . Heart disease Brother   . Heart disease Brother     Pertinent items are noted in HPI. Weight change:   Intake/Output Summary (Last 24 hours) at 03/17/15 1445 Last data filed at 03/17/15 1400  Gross per 24 hour  Intake    120 ml  Output    250 ml  Net   -130 ml   BP 173/76 mmHg  Pulse 79  Temp(Src) 97.8 F (36.6 C) (Oral)  Resp 18  Wt 72.1 kg (158 lb 15.2 oz)  SpO2 98% Filed Vitals:   03/17/15 0937 03/17/15 1309  BP: 173/76   Pulse: 88 79  Temp: 97.9 F (36.6 C) 97.8 F (36.6 C)  TempSrc: Oral Oral  Resp: 18 18  Weight:  72.1 kg (158 lb 15.2 oz)  SpO2: 98% 98%     General appearance: alert, cooperative and no distress Head: Normocephalic, without obvious abnormality, atraumatic Eyes: +periorbital edema, no icterus Neck: no adenopathy, no carotid bruit, no JVD, supple, symmetrical, trachea midline and thyroid not enlarged, symmetric, no tenderness/mass/nodules Resp: clear to auscultation bilaterally Cardio: regular rate and rhythm and no rub GI: soft, non-tender; bowel sounds normal; no masses,  no organomegaly Extremities: edema 3+ lower extremities to knees  Labs: Basic Metabolic Panel:  Recent Labs Lab 03/17/15 1000  NA 133*  K 5.2*  CL 98  CO2 27  GLUCOSE 90  BUN 37*   CREATININE 1.18*  ALBUMIN 1.1*  CALCIUM 8.1*   Liver Function Tests:  Recent Labs Lab 03/17/15 1000  AST 25  ALT 10  ALKPHOS 59  BILITOT 0.9  PROT 5.0*  ALBUMIN 1.1*   No results for input(s): LIPASE, AMYLASE in the last 168 hours. No results for input(s): AMMONIA in the last 168 hours. CBC:  Recent Labs Lab 03/17/15 1000  WBC 7.3  NEUTROABS 4.8  HGB 11.1*  HCT 33.5*  MCV 90.1  PLT 354   PT/INR: $Remove'@LABRCNTIP'HSNrNBP$ (inr:5) Cardiac Enzymes: )No results for input(s): CKTOTAL, CKMB, CKMBINDEX, TROPONINI in the last 168 hours. CBG: No results for input(s): GLUCAP in the last  168 hours.  Iron Studies: No results for input(s): IRON, TIBC, TRANSFERRIN, FERRITIN in the last 168 hours.  Xrays/Other Studies: Dg Chest 2 View  03/17/2015   CLINICAL DATA:  Lower extremity edema and nephrotic syndrome.  EXAM: CHEST - 2 VIEW  COMPARISON:  03/07/2015  FINDINGS: The heart size and mediastinal contours are within normal limits. Left basilar atelectasis and scarring remain. There are small bilateral pleural effusions without overt edema. No focal nodules identified. The spine demonstrates osteopenia and degenerative changes of the thoracic spine.  IMPRESSION: Left basilar atelectasis and scarring. Bilateral small pleural effusions without overt pulmonary edema.   Electronically Signed   By: Aletta Edouard M.D.   On: 03/17/2015 10:43     Assessment/Plan: 1. Nephrotic syndrome with anasarca- new onset of approximately 6 weeks.  DDx: minimal change disease (MCD), FSGS, IgA, Membranous GN (either primary or secondary), multiple myeloma, mebranoproliferative, but also fibrillary GN or C1q Nephropathy.  Also need to check TSH to r/o myxedema.  Agree with IV diuresis and will order some other serologies as well as 24 hour UPEP.  She will require renal biopsy and will try to arrange this for Monday. 2. AKI- in setting of aggressive diuresis and ACE-Inhibition.  Will hold lisinopril as she may have had  some angioedema from this as well.  Continue to follow UOP and Scr 3. Anemia- new issue and raises concern over multiple myeloma or other malignancy.  Recommend skeletal survey and possible bone marrow biopsy.  Will follow iron stores 4. HTN- diurese and consider adding a betablocker but hold ACE due to AKI/CKD and possible angioedema 5. CAD- stable 6. Hypothyroidism- check TSH   Deepti Gunawan A 03/17/2015, 2:45 PM

## 2015-03-17 NOTE — Progress Notes (Signed)
73 F sent to ED today c Anasarca from Nephrotic Syndrome W/Up in Progress. I have talked c renal as outpt. She has Renal US 4/19 at [facility] c Ascites, ?echogenecity on liver but obscured by gas. Normal looking kidneys. She is aware she may have a malignancy as cause. We have pushed Lasix without help. Will need admission for CT Chest/Ab pelvis for R/Out malig. Needs ANCA to rule out autoimmune renal issue. May need Renal Bx. If no malig and Minimal change - High dose steroids will help  Below is her W/up. Her Albumin is only 1.  CH50 = 56 and normal. ! ANA Direct  Negative ! Complement C3, Serum      133 mg/dL                   82-167  ! Complement C4, Serum [H]  63 mg/dL                    14-44 HCV Antibody (140659)/! Hep C Virus Ab            <0.1 s/co ratio             0.0-0.9                                                      Negative:     < 0.8                                                 Indeterminate: 0.8 - 0.9                                                      Positive:     > 0.9 ! RA Latex Turbid.          9.4 IU/mL                   0.0-13.9 ! Hep B Surface Ab, Qual        Non Reactive                                  Non Reactive: Inconsistent with immunity,                                                less than 10 mIU/mL                                  Reactive:     Consistent with immunity,  greater than 9.9 mIU/mL Labs done 4/19 showed No Lupus, Compliment levels non-revealing, No Hep B or C, No Rheumatoid  Await Serum Free Light Chains.      GLUCOSE                   91 mg/dl                    60-110   BUN                  [H]  36 mg/dl                    5-23   CREATININE                1.2 mg/dl                   0.3-1.5  eGFR Non-African American                             42.8   SODIUM               [L]  133 mEq/L                   135-148   POTASSIUM                 4.6 mEq/L                    3.5-5.3   CHLORIDE                  99 mEq/L                    80-111   CO2                       27 mEq/L                    15-35   CALCIUM                   8.0 mg/dL                   7.0-10.5   TOTAL PROTEIN        [L]  5.1 g/dL                    6.0-8.5   ALBUMIN              [LL] 1.0 g/dL                    2.0-5.5     RFV=REPEATED FOR VERIFICATION   AST                       18 IU/L                     7-45   ALT                       14 IU/L                     5-40   ALK PHOS                  54 IU/L  37-137   TOTAL BILIRUBIN           0.2 mg/dl                   0.1-1.5  Tests: (2) TSH (3110)   TSH                  [H]  9.77 uIU/ml                 0.40-4.20  Synthroid adjusted  24 hr urine 4/18 - ! Protein,Total,Urine  [H]  1015.2 mg/dL                0.0-15.0     Results confirmed on     dilution.    ! Prot,24hr calculated [H]  2538.0 mg/24 hr             30.0-150.0  ! Albumin, U                59.0 % ! Alpha-1-Globulin, U       6.4 % ! Alpha-2-Globulin, U       9.2 % ! Beta Globulin, U          15.2 % ! Gamma Globulin, U         10.3 % ! M-Spike, %                Not Observed %              Not Observed ! Immunofixation Result, Urine                             Comment     An apparent normal immunofixation pattern.

## 2015-03-17 NOTE — ED Provider Notes (Signed)
CSN: [id number]     Arrival date & time 03/17/15  0076 History   First MD Initiated Contact with Patient 03/17/15 [phone]     Chief Complaint  Patient presents with  . Leg Swelling  . Nephrotic Syndrome     (Consider location/radiation/quality/duration/timing/severity/associated sxs/prior Treatment) The history is provided by the patient.   patient presents with 6 weeks of edema and was sent in for admission for nephrotic syndrome. Has been seen by her primary care doctor, the ER, and cardiology. Reportedly had echocardiogram. Has found to have low albumin and has reportedly been spilling large amounts of protein in her urine also. has had some shortness of breath. Has been on increasing doses of Lasix without improvement. Patient states she's put on 30 pounds. Her legs are now starting to weep. No chest pain. No fevers. No recent change in her medications or infection. Has been seen by  Dr. Virgina Jock. She's had more shortness of breath over the last few days also. She was found to be hypothyroid and was started on Synthroid also. Past Medical History  Diagnosis Date  . Hypertension   . Hyperlipidemia   . Myocardial infarction 1990  . CAD (coronary artery disease)     post MI in 64  . Osteopenia   . Hypothyroidism   . Fall Sept 2012    with left zygoma fracture  . Normal nuclear stress test July 2012    No ischemia.   . Right bundle branch block    Past Surgical History  Procedure Laterality Date  . Cardiac catheterization  10/18/1994    Single vessel disease with a severe stenosis in the RCA which had recannalized since prior study in 1990. Managed medically  . Cardiac catheterization  09/22/89  . Cataract extraction, bilateral  10/2009   Family History  Problem Relation Age of Onset  . Heart attack Father 50  . Heart attack Mother 60  . Heart disease Brother   . Heart disease Brother    History  Substance Use Topics  . Smoking status: Never Smoker   . Smokeless tobacco:  Never Used  . Alcohol Use: No   OB History    No data available     Review of Systems  Constitutional: Positive for fatigue and unexpected weight change. Negative for activity change and appetite change.  Eyes: Negative for pain.  Respiratory: Positive for shortness of breath. Negative for chest tightness.   Cardiovascular: Positive for leg swelling. Negative for chest pain.  Gastrointestinal: Negative for nausea, vomiting, abdominal pain and diarrhea.  Genitourinary: Negative for flank pain and difficulty urinating.  Musculoskeletal: Negative for back pain and neck stiffness.  Skin: Negative for rash.  Neurological: Negative for weakness, numbness and headaches.  Psychiatric/Behavioral: Negative for behavioral problems.      Allergies  Review of patient's allergies indicates no known allergies.  Home Medications   Prior to Admission medications   Medication Sig Start Date End Date Taking? Authorizing Provider  acetaminophen (TYLENOL) 325 MG tablet Take 650 mg by mouth every 6 (six) hours as needed for mild pain or moderate pain.   Yes Historical Provider, MD  atorvastatin (LIPITOR) 20 MG tablet Take 20 mg by mouth daily.     Yes Historical Provider, MD  denosumab (PROLIA) 60 MG/ML SOLN injection Inject 60 mg into the skin every 6 (six) months. Administer in upper arm, thigh, or abdomen *pt had this administered last 02/20/15   Yes Historical Provider, MD  ezetimibe (ZETIA) 10 MG tablet  Take 1 tablet (10 mg total) by mouth daily. 10/30/12  Yes Burtis Junes, NP  furosemide (LASIX) 40 MG tablet Take 40-60 mg by mouth 2 (two) times daily. 60 mg in the morning and 40 mg in the evening   Yes Historical Provider, MD  levothyroxine (SYNTHROID, LEVOTHROID) 25 MCG tablet Take 25 mcg by mouth daily before breakfast.   Yes Historical Provider, MD  lisinopril (PRINIVIL,ZESTRIL) 40 MG tablet Take 40 mg by mouth daily.     Yes Historical Provider, MD  levothyroxine (SYNTHROID, LEVOTHROID) 75  MCG tablet Take 75 mcg by mouth daily before breakfast.    Historical Provider, MD   BP 174/60 mmHg  Pulse 68  Temp(Src) 98.1 F (36.7 C) (Oral)  Resp 18  Ht $R'5\' 5"'ZK$  (1.651 m)  Wt 156 lb 15.5 oz (71.2 kg)  BMI 26.12 kg/m2  SpO2 96% Physical Exam  Constitutional: She is oriented to person, place, and time. She appears well-developed and well-nourished.  HENT:  Head: Normocephalic and atraumatic.  Eyes: EOM are normal. Pupils are equal, round, and reactive to light.  Neck: Normal range of motion. Neck supple.  Cardiovascular: Normal rate, regular rhythm and normal heart sounds.   No murmur heard. Pulmonary/Chest: Effort normal. No respiratory distress. She has no wheezes. She has rales.  May have few scattered rales  Abdominal: Soft. Bowel sounds are normal. She exhibits no distension. There is no tenderness. There is no rebound and no guarding.  Musculoskeletal: Normal range of motion. She exhibits edema.  Anasarca on both legs and some on the abdomen and right hand. Weeping on the legs.  Neurological: She is alert and oriented to person, place, and time. No cranial nerve deficit.  Skin: Skin is warm and dry.  Psychiatric: She has a normal mood and affect. Her speech is normal.  Nursing note and vitals reviewed.   ED Course  Procedures (including critical care time) Labs Review Labs Reviewed  COMPREHENSIVE METABOLIC PANEL - Abnormal; Notable for the following:    Sodium 133 (*)    Potassium 5.2 (*)    BUN 37 (*)    Creatinine, Ser 1.18 (*)    Calcium 8.1 (*)    Total Protein 5.0 (*)    Albumin 1.1 (*)    GFR calc non Af Amer 41 (*)    GFR calc Af Amer 48 (*)    All other components within normal limits  CBC WITH DIFFERENTIAL/PLATELET - Abnormal; Notable for the following:    RBC 3.72 (*)    Hemoglobin 11.1 (*)    HCT 33.5 (*)    All other components within normal limits  TSH - Abnormal; Notable for the following:    TSH 13.529 (*)    All other components within normal  limits  PROTEIN, URINE, 24 HOUR - Abnormal; Notable for the following:    Protein, 24H Urine 8580 (*)    All other components within normal limits  PROTEIN / CREATININE RATIO, URINE - Abnormal; Notable for the following:    Protein Creatinine Ratio 11.12 (*)    All other components within normal limits  URINALYSIS, ROUTINE W REFLEX MICROSCOPIC - Abnormal; Notable for the following:    APPearance HAZY (*)    Hgb urine dipstick SMALL (*)    Protein, ur >300 (*)    All other components within normal limits  IRON AND TIBC - Abnormal; Notable for the following:    Iron 32 (*)    TIBC 121 (*)  UIBC 89 (*)    All other components within normal limits  COMPREHENSIVE METABOLIC PANEL - Abnormal; Notable for the following:    BUN 37 (*)    Creatinine, Ser 1.17 (*)    Calcium 7.9 (*)    Total Protein 4.1 (*)    Albumin <1.0 (*)    GFR calc non Af Amer 42 (*)    GFR calc Af Amer 48 (*)    All other components within normal limits  CBC - Abnormal; Notable for the following:    RBC 3.32 (*)    Hemoglobin 10.0 (*)    HCT 30.0 (*)    All other components within normal limits  T3, FREE - Abnormal; Notable for the following:    T3, Free 1.5 (*)    All other components within normal limits  URINE MICROSCOPIC-ADD ON - Abnormal; Notable for the following:    Bacteria, UA FEW (*)    Casts HYALINE CASTS (*)    All other components within normal limits  T3 - Abnormal; Notable for the following:    T3, Total 61 (*)    All other components within normal limits  CBC - Abnormal; Notable for the following:    RBC 3.37 (*)    Hemoglobin 10.0 (*)    HCT 30.5 (*)    All other components within normal limits  COMPREHENSIVE METABOLIC PANEL - Abnormal; Notable for the following:    Glucose, Bld 102 (*)    BUN 36 (*)    Creatinine, Ser 1.12 (*)    Calcium 7.7 (*)    Total Protein 4.0 (*)    Albumin <1.0 (*)    GFR calc non Af Amer 44 (*)    GFR calc Af Amer 51 (*)    All other components within  normal limits  CBC - Abnormal; Notable for the following:    RBC 3.29 (*)    Hemoglobin 9.9 (*)    HCT 29.8 (*)    All other components within normal limits  RENAL FUNCTION PANEL - Abnormal; Notable for the following:    Sodium 132 (*)    BUN 38 (*)    Creatinine, Ser 1.23 (*)    Calcium 7.5 (*)    Albumin <1.0 (*)    GFR calc non Af Amer 39 (*)    GFR calc Af Amer 45 (*)    All other components within normal limits  SODIUM, URINE, RANDOM  CREATININE, URINE, RANDOM  ANTISTREPTOLYSIN O TITER  COMPLEMENT, TOTAL  FERRITIN  VITAMIN B12  PROTIME-INR  T4, FREE  ANCA TITERS  MPO/PR-3 (ANCA) ANTIBODIES  GLOMERULAR BASEMENT MEMBRANE ANTIBODIES  IMMUNOFIXATION ELECTROPHORESIS, URINE (WITH TOT PROT)  FOLATE RBC  OCCULT BLOOD X 1 CARD TO LAB, STOOL  C3 COMPLEMENT  C4 COMPLEMENT  HIV ANTIBODY (ROUTINE TESTING)  HEPATITIS C ANTIBODY  HEPATITIS B E ANTIGEN  HEMOGLOBIN A1C  LIPID PANEL  ANTINUCLEAR ANTIBODIES, IFA    Imaging Review Dg Bone Survey Met  03/18/2015   CLINICAL DATA:  81 year old with proteinuria and hypoalbuminemia. Evaluate for multiple myeloma as the cause for such.  EXAM: METASTATIC BONE SURVEY  COMPARISON:  Two-view chest x-ray yesterday and earlier. Maxillofacial CT 08/22/2011. CT head and cervical spine 12/30/2008. Left tibia fibula 02/10/2008.  FINDINGS: The following bones were imaged:  Lateral skull: No lytic lesions to suggest myeloma. Normal in appearance.  AP and lateral cervical spine: No lytic lesions to suggest myeloma. Disc space narrowing at C4-5 and C5-6. Facet degenerative changes  at C4-5, C5-6 and C6-7. Likely degenerative retrolisthesis of C5 relative to C6 measuring 3 mm and degenerative spondylolisthesis of C6 on C7 measuring 3 mm. Normal prevertebral soft tissues. Note made of bilateral carotid calcification.  PA chest for ribs: No lytic lesions to suggest myeloma. Linear scar or atelectasis at the left lung base, unchanged since yesterday. Small  bilateral pleural effusions, unchanged. No new pulmonary parenchymal abnormalities.  AP views of both shoulders: No lytic lesions to suggest myeloma. Slight narrowing of the subacromial space in the right shoulder and mild degenerative changes in the right acromioclavicular joint. No significant abnormality involving the left shoulder.  AP views of both humeri: No lytic lesions to suggest myeloma. Well preserved bone mineral density for age.  AP views of both forearms: No lytic lesions to suggest myeloma. Symmetric mild radiocarpal joint space narrowing in both wrists. Visualized portions of both elbow joints intact.  AP and lateral thoracic spine: 12 rib-bearing thoracic vertebrae with anatomic alignment. Exaggeration of the usual thoracic kyphosis. No visible lytic lesions to suggest myeloma. Intact pedicles. Disc space narrowing and endplate hypertrophic changes at multiple mid and lower thoracic levels.  AP and lateral lumbar spine: Five non rib-bearing lumbar vertebrae. Lumbar scoliosis convex left. Disc space narrowing and endplate hypertrophic changes at every lumbar level. Grade 1 spondylolisthesis of L4 on L5 approximating 8 mm, likely degenerative. Aortoiliac atherosclerosis.  AP pelvis: No lytic lesions to suggest myeloma. Moderate joint space narrowing involving the left hip. Well preserved joint space in the right hip. Sacroiliac joints and symphysis pubis intact with mild degenerative changes.  AP views of both femora: No lytic lesions to suggest myeloma. Bone mineral density well-preserved.  AP views of both tibia fibula: No lytic lesions to suggest myeloma. Mild degenerative changes involving the visualized right knee joint. Visualized left knee joint and both ankle joints intact.  IMPRESSION: 1. No evidence of multiple myeloma. 2. Degenerative changes as detailed above.   Electronically Signed   By: Evangeline Dakin M.D.   On: 03/18/2015 14:18     EKG Interpretation None      MDM   Final  diagnoses:  Hyponatremia  Hyperkalemia  Hypoproteinemia ? etiology    Patient with low protein and anasarca. Sent in for admission. Has reportedly had Echo. Low Na also. Admit. Discussed with nephrology    Davonna Belling, MD 03/20/15 [phone]

## 2015-03-17 NOTE — H&P (Signed)
[facility] History and Physical  Erika [name] NID:[id number] DOB: Mar 10, 1928 DOA: 03/17/2015  Referring physician: Dr Erika [name]. PCP: Erika Reel, MD   Chief Complaint:   HPI: Erika [name] is a 81 y.o. female with PMH significant for HTN, HLD, seeing by PCP for anasarca, hypoalbuminemia, has received lasix without significant improvement of volume status, she was evaluated by her cardiology for HF, and also had doppler LE negative for DVT. Her PCP has been doing a work up for anasarca and hypoalbuminemia. She continue to accumulate  Fluids, for this reason she presents to the ED. It has been difficult to work, due to swelling. She denies chest pain , dyspnea, diarrhea, or abdominal pain.   Review of Systems:  Negative except as per HPI.   Past Medical History  Diagnosis Date  . Hypertension   . Hyperlipidemia   . Myocardial infarction 1990  . CAD (coronary artery disease)     post MI in 55  . Osteopenia   . Hypothyroidism   . Fall Sept 2012    with left zygoma fracture  . Normal nuclear stress test July 2012    No ischemia.   . Right bundle branch block    Past Surgical History  Procedure Laterality Date  . Cardiac catheterization  10/18/1994    Single vessel disease with a severe stenosis in the RCA which had recannalized since prior study in 1990. Managed medically  . Cardiac catheterization  09/22/89  . Cataract extraction, bilateral  10/2009   Social History:  reports that she has never smoked. She has never used smokeless tobacco. She reports that she does not drink alcohol or use illicit drugs.  No Known Allergies  Family History  Problem Relation Age of Onset  . Heart attack Father 56  . Heart attack Mother 67  . Heart disease Brother   . Heart disease Brother     Prior to Admission medications   Medication Sig Start Date End Date Taking? Authorizing Provider  amLODipine (NORVASC) 5 MG tablet Take 5 mg by mouth daily.      Historical Provider,  MD  atorvastatin (LIPITOR) 20 MG tablet Take 20 mg by mouth daily.      Historical Provider, MD  B Complex Vitamins (B-COMPLEX/B-12) TABS Take 1 tablet by mouth daily.      Historical Provider, MD  calcium-vitamin D (OSCAL WITH D) 500-200 MG-UNIT per tablet Take 1 tablet by mouth daily.      Historical Provider, MD  denosumab (PROLIA) 60 MG/ML SOLN injection Inject 60 mg into the skin every 6 (six) months. Administer in upper arm, thigh, or abdomen *pt had this administered last 02/20/15    Historical Provider, MD  ezetimibe (ZETIA) 10 MG tablet Take 1 tablet (10 mg total) by mouth daily. 10/30/12   Erika Junes, NP  furosemide (LASIX) 40 MG tablet Take 40 mg by mouth daily.    Historical Provider, MD  levothyroxine (SYNTHROID, LEVOTHROID) 25 MCG tablet Take 25 mcg by mouth daily before breakfast.    Historical Provider, MD  lisinopril (PRINIVIL,ZESTRIL) 40 MG tablet Take 40 mg by mouth daily.      Historical Provider, MD   Physical Exam: Filed Vitals:   03/17/15 0937  BP: 173/76  Pulse: 88  Temp: 97.9 F (36.6 C)  TempSrc: Oral  Resp: 18  SpO2: 98%    Wt Readings from Last 3 Encounters:  03/07/15 70.052 kg (154 lb 7 oz)  02/17/15 66.497 kg (146 lb 9.6 oz)  02/06/15 63.05 kg (139 lb)    General:  Appears calm and comfortable Eyes: PERRL, normal lids, irises & conjunctiva ENT: grossly normal hearing, lips & tongue Neck: no LAD, masses or thyromegaly Cardiovascular: RRR, no m/r/g. Plus 2 edema, oozing, mild redness LE.  Telemetry: SR, no arrhythmias  Respiratory: CTA bilaterally, no w/r/r. Normal respiratory effort. Abdomen: soft, ntnd Skin: no rash or induration seen on limited exam Musculoskeletal: grossly normal tone BUE/BLE Psychiatric: grossly normal mood and affect, speech fluent and appropriate Neurologic: grossly non-focal.          Labs on Admission:  Basic Metabolic Panel:  Recent Labs Lab 03/17/15 1000  NA 133*  K 5.2*  CL 98  CO2 27  GLUCOSE 90  BUN  37*  CREATININE 1.18*  CALCIUM 8.1*   Liver Function Tests:  Recent Labs Lab 03/17/15 1000  AST 25  ALT 10  ALKPHOS 59  BILITOT 0.9  PROT 5.0*  ALBUMIN 1.1*   No results for input(s): LIPASE, AMYLASE in the last 168 hours. No results for input(s): AMMONIA in the last 168 hours. CBC:  Recent Labs Lab 03/17/15 1000  WBC 7.3  NEUTROABS 4.8  HGB 11.1*  HCT 33.5*  MCV 90.1  PLT 354   Cardiac Enzymes: No results for input(s): CKTOTAL, CKMB, CKMBINDEX, TROPONINI in the last 168 hours.  BNP (last 3 results)  Recent Labs  03/07/15 1100  BNP 163.8*    ProBNP (last 3 results) No results for input(s): PROBNP in the last 8760 hours.  CBG: No results for input(s): GLUCAP in the last 168 hours.  Radiological Exams on Admission: Dg Chest 2 View  03/17/2015   CLINICAL DATA:  Lower extremity edema and nephrotic syndrome.  EXAM: CHEST - 2 VIEW  COMPARISON:  03/07/2015  FINDINGS: The heart size and mediastinal contours are within normal limits. Left basilar atelectasis and scarring remain. There are small bilateral pleural effusions without overt edema. No focal nodules identified. The spine demonstrates osteopenia and degenerative changes of the thoracic spine.  IMPRESSION: Left basilar atelectasis and scarring. Bilateral small pleural effusions without overt pulmonary edema.   Electronically Signed   By: Aletta Edouard M.D.   On: 03/17/2015 10:43    EKG: Independently reviewed. Old RBBB  Assessment/Plan Active Problems:   Hypothyroidism-labs 01/2015.   Hypoproteinemia ? etiology   Hyponatremia   Hyperkalemia   1-Anasarca, poteinuria: hypoalbuminemia.  -Had Renal US at [facility] ? Ascites, echogenicity of liver. Normal kidneys, normal LFT in the past.  -Discussed with Erika [name] PCP, patient had 24 hour urine protein at 2.5 gr, he did work up for MM, no M spike, free light chain pending.  -Had ECHO and doppler done by Erika [name], cardiology. Unremarkable per patient.  -Renal  consulted. Concern for nephrotic syndrome.  -Will follow renal recommendation regarding further imagine.  -Will start IV lasix 40 Mg IV DID. Hold lisinopril to avoid hypotension.  -BNP at 163.   3-Hypothyroidism:  Check free T 3 and T 4.  Continue with synthroid 75 mcg/   3-Hyperkalemia; started on IV lasix. Repeat labs in am.  4-Hyponatremia; related to volume overload. Follow trend.    Code Status: Whishes to be DNR.  DVT Prophylaxis: Lovenox.  Family Communication: Care discussed with husband who was at bedside.  Disposition Plan: expect 3 to 4 days inpatient.  Time spent: 75 minutes.   Niel Hummer A [facility] Pager [phone]

## 2015-03-17 NOTE — ED Notes (Signed)
Pt was seen here previously before in ED. Pt called PCP Dr. Virgina Jock and she was told to come to ED for possible admission for Nephrotic Syndrome. Pt stated that swelling started in bilateral feet and that moved up to legs, right hand, left breast and lips. Pt has weeping from bilateral leg swelling. Denies any pain.

## 2015-03-18 ENCOUNTER — Inpatient Hospital Stay (HOSPITAL_COMMUNITY): Payer: Medicare Other

## 2015-03-18 DIAGNOSIS — E039 Hypothyroidism, unspecified: Secondary | ICD-10-CM

## 2015-03-18 DIAGNOSIS — E778 Other disorders of glycoprotein metabolism: Secondary | ICD-10-CM

## 2015-03-18 LAB — CBC
HCT: 30 % — ABNORMAL LOW (ref 36.0–46.0)
HEMOGLOBIN: 10 g/dL — AB (ref 12.0–15.0)
MCH: 30.1 pg (ref 26.0–34.0)
MCHC: 33.3 g/dL (ref 30.0–36.0)
MCV: 90.4 fL (ref 78.0–100.0)
Platelets: 337 10*3/uL (ref 150–400)
RBC: 3.32 MIL/uL — AB (ref 3.87–5.11)
RDW: 14.7 % (ref 11.5–15.5)
WBC: 5.7 10*3/uL (ref 4.0–10.5)

## 2015-03-18 LAB — FERRITIN: FERRITIN: 268 ng/mL (ref 10–291)

## 2015-03-18 LAB — PROTIME-INR
INR: 1 (ref 0.00–1.49)
PROTHROMBIN TIME: 13.3 s (ref 11.6–15.2)

## 2015-03-18 LAB — ANTISTREPTOLYSIN O TITER: ASO: 9.8 IU/mL (ref 0.0–200.0)

## 2015-03-18 LAB — COMPREHENSIVE METABOLIC PANEL
ALT: 10 U/L (ref 0–35)
AST: 21 U/L (ref 0–37)
Albumin: <1 g/dL — ABNORMAL LOW (ref 3.5–5.2)
Alkaline Phosphatase: 52 U/L (ref 39–117)
Anion gap: 6 (ref 5–15)
BUN: 37 mg/dL — AB (ref 6–23)
CALCIUM: 7.9 mg/dL — AB (ref 8.4–10.5)
CHLORIDE: 102 mmol/L (ref 96–112)
CO2: 28 mmol/L (ref 19–32)
Creatinine, Ser: 1.17 mg/dL — ABNORMAL HIGH (ref 0.50–1.10)
GFR calc Af Amer: 48 mL/min — ABNORMAL LOW (ref >90)
GFR calc non Af Amer: 42 mL/min — ABNORMAL LOW (ref >90)
GLUCOSE: 96 mg/dL (ref 70–99)
Potassium: 4.4 mmol/L (ref 3.5–5.1)
Sodium: 136 mmol/L (ref 135–145)
Total Bilirubin: 0.5 mg/dL (ref 0.3–1.2)
Total Protein: 4.1 g/dL — ABNORMAL LOW (ref 6.0–8.3)

## 2015-03-18 LAB — VITAMIN B12: VITAMIN B 12: 493 pg/mL (ref 211–911)

## 2015-03-18 LAB — IRON AND TIBC
IRON: 32 ug/dL — AB (ref 42–145)
Saturation Ratios: 26 % (ref 20–55)
TIBC: 121 ug/dL — ABNORMAL LOW (ref 250–470)
UIBC: 89 ug/dL — ABNORMAL LOW (ref 125–400)

## 2015-03-18 LAB — SODIUM, URINE, RANDOM: SODIUM UR: 26 mmol/L

## 2015-03-18 LAB — PROTEIN / CREATININE RATIO, URINE
Creatinine, Urine: 54.43 mg/dL
PROTEIN CREATININE RATIO: 11.12 — AB (ref 0.00–0.15)
Total Protein, Urine: 605 mg/dL

## 2015-03-18 LAB — COMPLEMENT, TOTAL: COMPL TOTAL (CH50): 56 U/mL (ref 42–60)

## 2015-03-18 LAB — T4, FREE: Free T4: 0.82 ng/dL (ref 0.80–1.80)

## 2015-03-18 LAB — CREATININE, URINE, RANDOM: Creatinine, Urine: 55.32 mg/dL

## 2015-03-18 MED ORDER — LEVOTHYROXINE SODIUM 75 MCG PO TABS
75.0000 ug | ORAL_TABLET | Freq: Every day | ORAL | Status: DC
  Administered 2015-03-19 – 2015-03-21 (×3): 75 ug via ORAL
  Filled 2015-03-18 (×6): qty 1

## 2015-03-18 MED ORDER — LEVOTHYROXINE SODIUM 100 MCG PO TABS
100.0000 ug | ORAL_TABLET | Freq: Every day | ORAL | Status: DC
  Filled 2015-03-18: qty 1

## 2015-03-18 NOTE — Progress Notes (Signed)
Patient stated to this nurse that during the night she "spilled a lot of urine on the floor.  I got 'caught up' in my wires."  24h. Urine  Will be restarted as a result.

## 2015-03-18 NOTE — Progress Notes (Signed)
[facility]                                                                              Erika [name] Demographics  Erika [name], is a 81 y.o. female, DOB - 09/28/1928, EXB:[id number]  Admit date - 03/17/2015   Admitting Physician Elmarie Shiley, MD  Outpatient Primary MD for the Erika [name] is Precious Reel, MD  LOS - 1   Chief Complaint  Erika [name] presents with  . Leg Swelling  . Nephrotic Syndrome       Brief HPI   Erika [name] is a 81 y.o. female with PMH significant for HTN, HLD, seeing by PCP for anasarca, hypoalbuminemia, has received lasix without significant improvement of volume status, she was evaluated by her cardiology for HF, and also had doppler LE negative for DVT. Her PCP has been doing a work up for anasarca and hypoalbuminemia. She continued to accumulate Fluids, for this reason she presented to the ED. It has been difficult to work, due to swelling. She denied chest pain , dyspnea, diarrhea, or abdominal pain. Erika [name] also reported being refractory to oral Lasix and had developed eye and lip spelling of the day of admission.She has also had initial workup for nephrotic syndrome started by her PCP, Dr. Shon Baton who noted significant proteinuria on Urinalysis and 24 hour urine protein of 2.5grams, low albumin, normal complements and SPEP.   Assessment & Plan    Principal Problem:   Anasarca with hypoalbuminemia, proteinuria,:? Nephrotic syndrome, multiple myeloma, myxedema, glomerulonephritis - Erika [name] started on IV diuresis, nephrology consulted - Serologies pending - Ordered bone survey to rule out multiple myeloma - IR consulted for renal biopsy on Monday  Active Problems:   Hypothyroidism-labs 01/2015. - Erika [name] reported that she was started on Synthroid 4 weeks ago, currently on 25 MCG daily, and was supposed to start on 75 MCG on 4/23 -  will continue Synthroid at the current dose, follow T3, T4  Anemia - SPEP, UPEP pending, - will check  stool occult test, anemia panel, skeletal survey - May need bone marrow biopsy    Hyponatremia, Hyperkalemia - improving    Code Status: full code  Family Communication: Discussed in detail with the Erika [name], all imaging results, lab results explained to the Erika [name]    Disposition Plan: pending further evaluation  Time Spent in minutes  25 minutes  Procedures  none  Consults   renal  DVT Prophylaxis   Lovenox   Medications  Scheduled Meds: . enoxaparin (LOVENOX) injection  40 mg Subcutaneous Q24H  . ezetimibe  10 mg Oral Daily  . furosemide  40 mg Intravenous BID  . [START ON 03/19/2015] levothyroxine  100 mcg Oral QAC breakfast  . sodium chloride  3 mL Intravenous Q12H  . sodium chloride  3 mL Intravenous Q12H   Continuous Infusions:  PRN Meds:.sodium chloride, acetaminophen **OR** acetaminophen, ondansetron **OR** ondansetron (ZOFRAN) IV, sodium chloride   Antibiotics   Anti-infectives    None        Subjective:   Erika [name] was seen and examined today.  Erika [name] denies dizziness, chest pain, shortness of breath, abdominal pain, N/V/D/C, new  weakness, numbess, tingling. No acute events overnight. Still significant peripheral edema   Objective:   Blood pressure 157/74, pulse 92, temperature 98.2 F (36.8 C), temperature source Oral, resp. rate 20, height $RemoveBe'5\' 5"'xuZddHnsm$  (1.651 m), weight 72.1 kg (158 lb 15.2 oz), SpO2 96 %.  Wt Readings from Last 3 Encounters:  03/17/15 72.1 kg (158 lb 15.2 oz)  03/07/15 70.052 kg (154 lb 7 oz)  02/17/15 66.497 kg (146 lb 9.6 oz)     Intake/Output Summary (Last 24 hours) at 03/18/15 1129 Last data filed at 03/18/15 1050  Gross per 24 hour  Intake    963 ml  Output   1300 ml  Net   -337 ml    Exam  General: Alert and oriented x 3, NAD  HEENT:  PERRLA, EOMI, Anicteic Sclera, mucous membranes moist.   Neck: Supple, no JVD, no masses  CVS: S1 S2 auscultated, no rubs, murmurs or gallops. Regular rate and  rhythm.  Respiratory: Clear to auscultation bilaterally, no wheezing, rales or rhonchi  Abdomen: Soft, nontender, nondistended, + bowel sounds  Ext: no cyanosis clubbing, 3+pitting edema  Neuro: AAOx3, Cr N's II- XII. Strength 5/5 upper and lower extremities bilaterally  Skin: No rashes  Psych: Normal affect and demeanor, alert and oriented x3    Data Review   Micro Results No results found for this or any previous visit (from the past 240 hour(s)).  Radiology Reports Dg Chest 2 View  03/17/2015   CLINICAL DATA:  Lower extremity edema and nephrotic syndrome.  EXAM: CHEST - 2 VIEW  COMPARISON:  03/07/2015  FINDINGS: The heart size and mediastinal contours are within normal limits. Left basilar atelectasis and scarring remain. There are small bilateral pleural effusions without overt edema. No focal nodules identified. The spine demonstrates osteopenia and degenerative changes of the thoracic spine.  IMPRESSION: Left basilar atelectasis and scarring. Bilateral small pleural effusions without overt pulmonary edema.   Electronically Signed   By: Aletta Edouard M.D.   On: 03/17/2015 10:43   Dg Chest Portable 1 View  03/07/2015   CLINICAL DATA:  Bilateral lower extremity edema; shortness of breath  EXAM: PORTABLE CHEST - 1 VIEW  COMPARISON:  February 06, 2015  FINDINGS: There is scarring in the left base region. There is no edema or consolidation. Heart size and pulmonary vascularity are normal. No adenopathy. No bone lesions.  IMPRESSION: Scarring left base.  No edema or consolidation.   Electronically Signed   By: Lowella Grip III M.D.   On: 03/07/2015 11:15    CBC  Recent Labs Lab 03/17/15 1000 03/18/15 0405  WBC 7.3 5.7  HGB 11.1* 10.0*  HCT 33.5* 30.0*  PLT 354 337  MCV 90.1 90.4  MCH 29.8 30.1  MCHC 33.1 33.3  RDW 14.5 14.7  LYMPHSABS 1.3  --   MONOABS 0.9  --   EOSABS 0.2  --   BASOSABS 0.1  --     Chemistries   Recent Labs Lab 03/17/15 1000 03/18/15 0405  NA  133* 136  K 5.2* 4.4  CL 98 102  CO2 27 28  GLUCOSE 90 96  BUN 37* 37*  CREATININE 1.18* 1.17*  CALCIUM 8.1* 7.9*  AST 25 21  ALT 10 10  ALKPHOS 59 52  BILITOT 0.9 0.5   ------------------------------------------------------------------------------------------------------------------ estimated creatinine clearance is 35.6 mL/min (by C-G formula based on Cr of 1.17). ------------------------------------------------------------------------------------------------------------------ No results for input(s): HGBA1C in the last 72 hours. ------------------------------------------------------------------------------------------------------------------ No results for input(s): CHOL, HDL, LDLCALC,  TRIG, CHOLHDL, LDLDIRECT in the last 72 hours. ------------------------------------------------------------------------------------------------------------------  Recent Labs  03/17/15 1000  TSH 13.529*   ------------------------------------------------------------------------------------------------------------------ No results for input(s): VITAMINB12, FOLATE, FERRITIN, TIBC, IRON, RETICCTPCT in the last 72 hours.  Coagulation profile  Recent Labs Lab 03/18/15 0405  INR 1.00    No results for input(s): DDIMER in the last 72 hours.  Cardiac Enzymes No results for input(s): CKMB, TROPONINI, MYOGLOBIN in the last 168 hours.  Invalid input(s): CK ------------------------------------------------------------------------------------------------------------------ Invalid input(s): POCBNP  No results for input(s): GLUCAP in the last 72 hours.   Montel Vanderhoof M.D. [facility] 03/18/2015, 11:29 AM  Pager: [phone]   Between 7am to 7pm - call Pager - [phone]  After 7pm go to [email or web address] - password [email or web address]  Call night coverage person covering after 7pm

## 2015-03-18 NOTE — Progress Notes (Signed)
Patient ID: ELINORA WEIGAND, female   DOB: February 11, 1928, 81 y.o.   MRN: [id number] S:feels better O:BP 157/74 mmHg  Pulse 92  Temp(Src) 98.2 F (36.8 C) (Oral)  Resp 20  Ht _0  (1.651 m)  Wt 72.1 kg (158 lb 15.2 oz)  BMI 26.45 kg/m2  SpO2 96%  Intake/Output Summary (Last 24 hours) at 03/18/15 0850 Last data filed at 03/18/15 0700  Gross per 24 hour  Intake    960 ml  Output   1300 ml  Net   -340 ml   Intake/Output: I/O last 3 completed shifts: In: 59 [P.O.:960] Out: 1300 [Urine:1300]  Intake/Output this shift:    Weight change:  Gen:WD WN WF in NAD CVS:no rub Resp:cta AQT:MAUQJF Ext:3+ edema lower extremities   Recent Labs Lab 03/17/15 1000 03/18/15 0405  NA 133* 136  K 5.2* 4.4  CL 98 102  CO2 27 28  GLUCOSE 90 96  BUN 37* 37*  CREATININE 1.18* 1.17*  ALBUMIN 1.1* <1.0*  CALCIUM 8.1* 7.9*  AST 25 21  ALT 10 10   Liver Function Tests:  Recent Labs Lab 03/17/15 1000 03/18/15 0405  AST 25 21  ALT 10 10  ALKPHOS 59 52  BILITOT 0.9 0.5  PROT 5.0* 4.1*  ALBUMIN 1.1* <1.0*   No results for input(s): LIPASE, AMYLASE in the last 168 hours. No results for input(s): AMMONIA in the last 168 hours. CBC:  Recent Labs Lab 03/17/15 1000 03/18/15 0405  WBC 7.3 5.7  NEUTROABS 4.8  --   HGB 11.1* 10.0*  HCT 33.5* 30.0*  MCV 90.1 90.4  PLT 354 337   Cardiac Enzymes: No results for input(s): CKTOTAL, CKMB, CKMBINDEX, TROPONINI in the last 168 hours. CBG: No results for input(s): GLUCAP in the last 168 hours.  Iron Studies: No results for input(s): IRON, TIBC, TRANSFERRIN, FERRITIN in the last 72 hours. Studies/Results: Dg Chest 2 View  03/17/2015   CLINICAL DATA:  Lower extremity edema and nephrotic syndrome.  EXAM: CHEST - 2 VIEW  COMPARISON:  03/07/2015  FINDINGS: The heart size and mediastinal contours are within normal limits. Left basilar atelectasis and scarring remain. There are small bilateral pleural effusions without overt edema. No focal  nodules identified. The spine demonstrates osteopenia and degenerative changes of the thoracic spine.  IMPRESSION: Left basilar atelectasis and scarring. Bilateral small pleural effusions without overt pulmonary edema.   Electronically Signed   By: Aletta Edouard M.D.   On: 03/17/2015 10:43   . enoxaparin (LOVENOX) injection  40 mg Subcutaneous Q24H  . ezetimibe  10 mg Oral Daily  . furosemide  40 mg Intravenous BID  . levothyroxine  75 mcg Oral QAC breakfast  . sodium chloride  3 mL Intravenous Q12H  . sodium chloride  3 mL Intravenous Q12H    BMET    Component Value Date/Time   NA 136 03/18/2015 0405   K 4.4 03/18/2015 0405   CL 102 03/18/2015 0405   CO2 28 03/18/2015 0405   GLUCOSE 96 03/18/2015 0405   BUN 37* 03/18/2015 0405   CREATININE 1.17* 03/18/2015 0405   CREATININE 0.96 02/17/2015 1812   CALCIUM 7.9* 03/18/2015 0405   GFRNONAA 42* 03/18/2015 0405   GFRAA 48* 03/18/2015 0405   CBC    Component Value Date/Time   WBC 5.7 03/18/2015 0405   WBC 7.9 02/06/2015 1116   RBC 3.32* 03/18/2015 0405   RBC 4.23 02/06/2015 1116   HGB 10.0* 03/18/2015 0405   HGB 12.1* 02/06/2015 1116  HCT 30.0* 03/18/2015 0405   HCT 38.9 02/06/2015 1116   PLT 337 03/18/2015 0405   MCV 90.4 03/18/2015 0405   MCV 91.9 02/06/2015 1116   MCH 30.1 03/18/2015 0405   MCH 28.7 02/06/2015 1116   MCHC 33.3 03/18/2015 0405   MCHC 31.2* 02/06/2015 1116   RDW 14.7 03/18/2015 0405   LYMPHSABS 1.3 03/17/2015 1000   MONOABS 0.9 03/17/2015 1000   EOSABS 0.2 03/17/2015 1000   BASOSABS 0.1 03/17/2015 1000     Assessment/Plan: 1. Nephrotic syndrome with anasarca- new onset of approximately 6 weeks. DDx: minimal change disease (MCD), FSGS, IgA, Membranous GN (either primary or secondary), multiple myeloma, mebranoproliferative, but also fibrillary GN or C1q Nephropathy. Also need to check TSH to r/o myxedema. Agree with IV diuresis and will order some other serologies as well as 24 hour UPEP. She will  require renal biopsy and will try to arrange this for Monday. 1. TSH was high at 13.8 but think that this is primary GN as above and will consult IR for renal biopsy on Monday 2. Serologies pending. 3. Continue with diuresis but will increase lasix dose to 32m IV bid 2. AKI- in setting of aggressive diuresis and ACE-Inhibition.  1. Will hold lisinopril as she may have had some angioedema from this as well and would resume ARB rather than ACE when stable.  2. Continue to follow UOP and Scr 3. Anemia- new issue and raises concern over multiple myeloma or other malignancy.  1. UPEP pendind 2. consider skeletal survey and possible bone marrow biopsy. Will follow iron stores 4. HTN- diurese and consider adding a betablocker but hold ACE due to AKI/CKD and possible angioedema 5. CAD- stable 6. Hypothyroidism- check TSH  Osvaldo Lamping A

## 2015-03-19 LAB — T3: T3, Total: 61 ng/dL — ABNORMAL LOW (ref 71–180)

## 2015-03-19 LAB — PROTEIN, URINE, 24 HOUR
Collection Interval-UPROT: 24 hours
PROTEIN, 24H URINE: 8580 mg/d — AB (ref 50–100)
Protein, Urine: 780 mg/dL
URINE TOTAL VOLUME-UPROT: 1100 mL

## 2015-03-19 LAB — COMPREHENSIVE METABOLIC PANEL
ALT: 9 U/L (ref 0–35)
AST: 19 U/L (ref 0–37)
Albumin: <1 g/dL — ABNORMAL LOW (ref 3.5–5.2)
Alkaline Phosphatase: 50 U/L (ref 39–117)
Anion gap: 7 (ref 5–15)
BILIRUBIN TOTAL: 0.4 mg/dL (ref 0.3–1.2)
BUN: 36 mg/dL — ABNORMAL HIGH (ref 6–23)
CALCIUM: 7.7 mg/dL — AB (ref 8.4–10.5)
CHLORIDE: 100 mmol/L (ref 96–112)
CO2: 28 mmol/L (ref 19–32)
CREATININE: 1.12 mg/dL — AB (ref 0.50–1.10)
GFR calc Af Amer: 51 mL/min — ABNORMAL LOW (ref >90)
GFR calc non Af Amer: 44 mL/min — ABNORMAL LOW (ref >90)
GLUCOSE: 102 mg/dL — AB (ref 70–99)
Potassium: 4.3 mmol/L (ref 3.5–5.1)
Sodium: 135 mmol/L (ref 135–145)
TOTAL PROTEIN: 4 g/dL — AB (ref 6.0–8.3)

## 2015-03-19 LAB — CBC
HCT: 30.5 % — ABNORMAL LOW (ref 36.0–46.0)
HEMOGLOBIN: 10 g/dL — AB (ref 12.0–15.0)
MCH: 29.7 pg (ref 26.0–34.0)
MCHC: 32.8 g/dL (ref 30.0–36.0)
MCV: 90.5 fL (ref 78.0–100.0)
PLATELETS: 320 10*3/uL (ref 150–400)
RBC: 3.37 MIL/uL — AB (ref 3.87–5.11)
RDW: 14.6 % (ref 11.5–15.5)
WBC: 6.5 10*3/uL (ref 4.0–10.5)

## 2015-03-19 LAB — T3, FREE: T3 FREE: 1.5 pg/mL — AB (ref 2.0–4.4)

## 2015-03-19 MED ORDER — ENOXAPARIN SODIUM 40 MG/0.4ML ~~LOC~~ SOLN
40.0000 mg | SUBCUTANEOUS | Status: DC
  Administered 2015-03-21 – 2015-03-22 (×2): 40 mg via SUBCUTANEOUS
  Filled 2015-03-19 (×4): qty 0.4

## 2015-03-19 NOTE — Progress Notes (Signed)
Patient ID: Erika [name], female   DOB: 03-24-1928, 81 y.o.   MRN: [id number] S:feels better today O:BP 141/66 mmHg  Pulse 87  Temp(Src) 98.4 F (36.9 C) (Oral)  Resp 17  Ht $R'5\' 5"'SA$  (1.651 m)  Wt 70.6 kg (155 lb 10.3 oz)  BMI 25.90 kg/m2  SpO2 97%  Intake/Output Summary (Last 24 hours) at 03/19/15 0843 Last data filed at 03/18/15 2022  Gross per 24 hour  Intake    483 ml  Output    725 ml  Net   -242 ml   Intake/Output: I/O last 3 completed shifts: In: 963 [P.O.:960; I.V.:3] Out: 1325 [Urine:1325]  Intake/Output this shift:    Weight change: -1.5 kg (-3 lb 4.9 oz) Gen:WD WN WF in NAd CVS:no rub Resp:cta QBV:[id number] Ext: 3+ edema to knees   Recent Labs Lab 03/17/15 1000 03/18/15 0405 03/19/15 0413  NA 133* 136 135  K 5.2* 4.4 4.3  CL 98 102 100  CO2 $Re'27 28 28  'WME$ GLUCOSE 90 96 102*  BUN 37* 37* 36*  CREATININE 1.18* 1.17* 1.12*  ALBUMIN 1.1* <1.0* <1.0*  CALCIUM 8.1* 7.9* 7.7*  AST $Re'25 21 19  'Nvo$ ALT $R'10 10 9   'QR$ Liver Function Tests:  Recent Labs Lab 03/17/15 1000 03/18/15 0405 03/19/15 0413  AST $Re'25 21 19  'Elz$ ALT $R'10 10 9  'LJ$ ALKPHOS 59 52 50  BILITOT 0.9 0.5 0.4  PROT 5.0* 4.1* 4.0*  ALBUMIN 1.1* <1.0* <1.0*   No results for input(s): LIPASE, AMYLASE in the last 168 hours. No results for input(s): AMMONIA in the last 168 hours. CBC:  Recent Labs Lab 03/17/15 1000 03/18/15 0405 03/19/15 0413  WBC 7.3 5.7 6.5  NEUTROABS 4.8  --   --   HGB 11.1* 10.0* 10.0*  HCT 33.5* 30.0* 30.5*  MCV 90.1 90.4 90.5  PLT 354 337 320   Cardiac Enzymes: No results for input(s): CKTOTAL, CKMB, CKMBINDEX, TROPONINI in the last 168 hours. CBG: No results for input(s): GLUCAP in the last 168 hours.  Iron Studies:  Recent Labs  03/17/15 1620  IRON 32*  TIBC 121*  FERRITIN 268   Studies/Results: Dg Chest 2 View  03/17/2015   CLINICAL DATA:  Lower extremity edema and nephrotic syndrome.  EXAM: CHEST - 2 VIEW  COMPARISON:  03/07/2015  FINDINGS: The heart size and  mediastinal contours are within normal limits. Left basilar atelectasis and scarring remain. There are small bilateral pleural effusions without overt edema. No focal nodules identified. The spine demonstrates osteopenia and degenerative changes of the thoracic spine.  IMPRESSION: Left basilar atelectasis and scarring. Bilateral small pleural effusions without overt pulmonary edema.   Electronically Signed   By: Aletta Edouard M.D.   On: 03/17/2015 10:43   Dg Bone Survey Met  03/18/2015   CLINICAL DATA:  81 year old with proteinuria and hypoalbuminemia. Evaluate for multiple myeloma as the cause for such.  EXAM: METASTATIC BONE SURVEY  COMPARISON:  Two-view chest x-ray yesterday and earlier. Maxillofacial CT 08/22/2011. CT head and cervical spine 12/30/2008. Left tibia fibula 02/10/2008.  FINDINGS: The following bones were imaged:  Lateral skull: No lytic lesions to suggest myeloma. Normal in appearance.  AP and lateral cervical spine: No lytic lesions to suggest myeloma. Disc space narrowing at C4-5 and C5-6. Facet degenerative changes at C4-5, C5-6 and C6-7. Likely degenerative retrolisthesis of C5 relative to C6 measuring 3 mm and degenerative spondylolisthesis of C6 on C7 measuring 3 mm. Normal prevertebral soft tissues. Note made of bilateral carotid calcification.  PA chest for ribs: No lytic lesions to suggest myeloma. Linear scar or atelectasis at the left lung base, unchanged since yesterday. Small bilateral pleural effusions, unchanged. No new pulmonary parenchymal abnormalities.  AP views of both shoulders: No lytic lesions to suggest myeloma. Slight narrowing of the subacromial space in the right shoulder and mild degenerative changes in the right acromioclavicular joint. No significant abnormality involving the left shoulder.  AP views of both humeri: No lytic lesions to suggest myeloma. Well preserved bone mineral density for age.  AP views of both forearms: No lytic lesions to suggest myeloma.  Symmetric mild radiocarpal joint space narrowing in both wrists. Visualized portions of both elbow joints intact.  AP and lateral thoracic spine: 12 rib-bearing thoracic vertebrae with anatomic alignment. Exaggeration of the usual thoracic kyphosis. No visible lytic lesions to suggest myeloma. Intact pedicles. Disc space narrowing and endplate hypertrophic changes at multiple mid and lower thoracic levels.  AP and lateral lumbar spine: Five non rib-bearing lumbar vertebrae. Lumbar scoliosis convex left. Disc space narrowing and endplate hypertrophic changes at every lumbar level. Grade 1 spondylolisthesis of L4 on L5 approximating 8 mm, likely degenerative. Aortoiliac atherosclerosis.  AP pelvis: No lytic lesions to suggest myeloma. Moderate joint space narrowing involving the left hip. Well preserved joint space in the right hip. Sacroiliac joints and symphysis pubis intact with mild degenerative changes.  AP views of both femora: No lytic lesions to suggest myeloma. Bone mineral density well-preserved.  AP views of both tibia fibula: No lytic lesions to suggest myeloma. Mild degenerative changes involving the visualized right knee joint. Visualized left knee joint and both ankle joints intact.  IMPRESSION: 1. No evidence of multiple myeloma. 2. Degenerative changes as detailed above.   Electronically Signed   By: Evangeline Dakin M.D.   On: 03/18/2015 14:18   . enoxaparin (LOVENOX) injection  40 mg Subcutaneous Q24H  . ezetimibe  10 mg Oral Daily  . furosemide  40 mg Intravenous BID  . levothyroxine  75 mcg Oral QAC breakfast  . sodium chloride  3 mL Intravenous Q12H  . sodium chloride  3 mL Intravenous Q12H    BMET    Component Value Date/Time   NA 135 03/19/2015 0413   K 4.3 03/19/2015 0413   CL 100 03/19/2015 0413   CO2 28 03/19/2015 0413   GLUCOSE 102* 03/19/2015 0413   BUN 36* 03/19/2015 0413   CREATININE 1.12* 03/19/2015 0413   CREATININE 0.96 02/17/2015 1812   CALCIUM 7.7* 03/19/2015  0413   GFRNONAA 44* 03/19/2015 0413   GFRAA 51* 03/19/2015 0413   CBC    Component Value Date/Time   WBC 6.5 03/19/2015 0413   WBC 7.9 02/06/2015 1116   RBC 3.37* 03/19/2015 0413   RBC 4.23 02/06/2015 1116   HGB 10.0* 03/19/2015 0413   HGB 12.1* 02/06/2015 1116   HCT 30.5* 03/19/2015 0413   HCT 38.9 02/06/2015 1116   PLT 320 03/19/2015 0413   MCV 90.5 03/19/2015 0413   MCV 91.9 02/06/2015 1116   MCH 29.7 03/19/2015 0413   MCH 28.7 02/06/2015 1116   MCHC 32.8 03/19/2015 0413   MCHC 31.2* 02/06/2015 1116   RDW 14.6 03/19/2015 0413   LYMPHSABS 1.3 03/17/2015 1000   MONOABS 0.9 03/17/2015 1000   EOSABS 0.2 03/17/2015 1000   BASOSABS 0.1 03/17/2015 1000     Assessment/Plan: 1. Nephrotic syndrome with anasarca- new onset of approximately 6 weeks. DDx: minimal change disease (MCD), FSGS, IgA, Membranous GN (either primary or  secondary), multiple myeloma, mebranoproliferative, but also fibrillary GN or C1q Nephropathy. Also need to check TSH to r/o myxedema. Agree with IV diuresis and will order some other serologies as well as 24 hour UPEP. She will require renal biopsy and will try to arrange this for Monday. 1. TSH was high at 13.8 but think that this is primary GN as above and will consult IR for renal biopsy on Monday 2. Serologies pending. 3. Continue with diuresis but will increase lasix dose to $Remov'80mg'ijzVLS$  IV bid 4. For renal biopsy 03/20/15 (appreciate IR assistance) 2. AKI- in setting of aggressive diuresis and ACE-Inhibition.  1. Will hold lisinopril as she may have had some angioedema from this as well and would resume ARB rather than ACE when stable.  2. Continue to follow UOP and Scr 3. Anemia- new issue and raises concern over multiple myeloma or other malignancy.  1. UPEP pendind 2. consider skeletal survey and possible bone marrow biopsy. Will follow iron stores 4. HTN- diurese and consider adding a betablocker but hold ACE due to AKI/CKD and possible  angioedema 5. CAD- stable 6. Hypothyroidism- check TSH  Bonna Steury A

## 2015-03-19 NOTE — Consult Note (Signed)
WOC wound consult note Reason for Consult:Patient with 6-week history of bilateral LE edema with erythema.  Erythema is now resolved, edema persists and since Friday, has been weeping and saturating underpads.  Patient has been undergoing diuretic therapy and is down 5 pounds in 1 day (per bedside RN).   Wound type:Vascular insufficiency Pressure Ulcer POA: No Measurement:nearly imperceivable openings int he skin through which serous fluid is weeping Wound bed:No wounds Drainage (amount, consistency, odor) serous, large amount.  This subsides in part with LE elevation. Patient has been OOB at intervals (to bathroom, etc.)  Will reinforce need for LE elevation. Periwound:No maceration at this time Dressing procedure/placement/frequency: We will implement bilateral Unna's Boot therapy today via [facility].  Orders have been provided for application today and twice weekly thereafter (Tuesdays and Fridays). Patient is undergoing a complete workup which includes a renal biopsy tomorrow.  As edema resolves, patient should follow with PCP or [facility] for recommendations regarding compression hosiery, likely 30-43mmHg for continuation of therapy. HHRN can apso be consulted for application of the Unna's Boots until a follow up appointment can be arranged with either PCP or [facility].  If you agree, please order. [place] nursing team will not follow, but will remain available to this patient, the nursing and medical team.  Please re-consult if needed. Thanks, Maudie Flakes, MSN, RN, [place], [place], [place] [phone])

## 2015-03-19 NOTE — Progress Notes (Signed)
[facility]                                                                              Patient Demographics  Erika [name], is a 81 y.o. female, DOB - [birth year], JKQ:[id number]  Admit date - 03/17/2015   Admitting Physician Elmarie Shiley, MD  Outpatient Primary MD for the patient is Precious Reel, MD  LOS - 2   Chief Complaint  Patient presents with  . Leg Swelling  . Nephrotic Syndrome       Brief HPI   Erika [name] is a 81 y.o. female with PMH significant for HTN, HLD, seeing by PCP for anasarca, hypoalbuminemia, has received lasix without significant improvement of volume status, she was evaluated by her cardiology for HF, and also had doppler LE negative for DVT. Her PCP has been doing a work up for anasarca and hypoalbuminemia. She continued to accumulate Fluids, for this reason she presented to the ED. It has been difficult to work, due to swelling. She denied chest pain , dyspnea, diarrhea, or abdominal pain. Patient also reported being refractory to oral Lasix and had developed eye and lip spelling of the day of admission.She has also had initial workup for nephrotic syndrome started by her PCP, Dr. Shon Baton who noted significant proteinuria on Urinalysis and 24 hour urine protein of 2.5grams, low albumin, normal complements and SPEP.   Assessment & Plan    Principal Problem:   Anasarca with hypoalbuminemia, proteinuria,:? Nephrotic syndrome, multiple myeloma, myxedema, glomerulonephritis - Patient started on IV diuresis, nephrology consulted - Serologies pending, ASO titer negative - Bone survey negative for multiple myeloma or any lytic lesions - IR consulted for renal biopsy on Monday  Active Problems:   Hypothyroidism-labs 01/2015. - Patient reported that she was started on Synthroid 4 weeks ago, currently on 25 MCG daily, and was supposed to start on 75 MCG on 4/23 -  will continue Synthroid at the current dose, T4 0.82   Anemia -  SPEP, UPEP pending, - Skeletal survey negative for multiple myeloma, follow anemia panel, stool card test - May need bone marrow biopsy    Hyponatremia, Hyperkalemia - improving    Code Status: full code  Family Communication: Discussed in detail with the patient, all imaging results, lab results explained to the patient and husband   Disposition Plan: pending further evaluation  Time Spent in minutes  25 minutes  Procedures  none  Consults   renal  DVT Prophylaxis   Lovenox   Medications  Scheduled Meds: . [START ON 03/21/2015] enoxaparin (LOVENOX) injection  40 mg Subcutaneous Q24H  . ezetimibe  10 mg Oral Daily  . furosemide  40 mg Intravenous BID  . levothyroxine  75 mcg Oral QAC breakfast  . sodium chloride  3 mL Intravenous Q12H   Continuous Infusions:  PRN Meds:.acetaminophen **OR** acetaminophen, ondansetron **OR** ondansetron (ZOFRAN) IV, sodium chloride   Antibiotics   Anti-infectives    None        Subjective:   Erika [name] was seen and examined today.  Patient denies dizziness, chest pain, shortness of breath, abdominal pain, N/V/D/C, new weakness, numbess,  tingling. No acute events overnight. Still significant peripheral edema, weeping legs, no other complaints decreased edema around the eye   Objective:   Blood pressure 141/66, pulse 87, temperature 98.4 F (36.9 C), temperature source Oral, resp. rate 17, height $RemoveBe'5\' 5"'ciZxnbRmF$  (1.651 m), weight 70.6 kg (155 lb 10.3 oz), SpO2 97 %.  Wt Readings from Last 3 Encounters:  03/19/15 70.6 kg (155 lb 10.3 oz)  03/07/15 70.052 kg (154 lb 7 oz)  02/17/15 66.497 kg (146 lb 9.6 oz)     Intake/Output Summary (Last 24 hours) at 03/19/15 1133 Last data filed at 03/19/15 0930  Gross per 24 hour  Intake    870 ml  Output    875 ml  Net     -5 ml    Exam  General: Alert and oriented x 3, NAD  HEENT:  PERRLA, EOMI, Anicteic Sclera, mucous membranes moist.   Neck: Supple, no JVD, no masses  CVS: S1 S2  clear, no mrg  Respiratory: CTAB  Abdomen: Soft, nontender, nondistended, + bowel sounds  Ext: no cyanosis clubbing, 3+pitting edema, weeping  Neuro: AAOx3, Cr N's II- XII. Strength 5/5 upper and lower extremities bilaterally  Skin: No rashes  Psych: Normal affect and demeanor, alert and oriented x3    Data Review   Micro Results No results found for this or any previous visit (from the past 240 hour(s)).  Radiology Reports Dg Chest 2 View  03/17/2015   CLINICAL DATA:  Lower extremity edema and nephrotic syndrome.  EXAM: CHEST - 2 VIEW  COMPARISON:  03/07/2015  FINDINGS: The heart size and mediastinal contours are within normal limits. Left basilar atelectasis and scarring remain. There are small bilateral pleural effusions without overt edema. No focal nodules identified. The spine demonstrates osteopenia and degenerative changes of the thoracic spine.  IMPRESSION: Left basilar atelectasis and scarring. Bilateral small pleural effusions without overt pulmonary edema.   Electronically Signed   By: Aletta Edouard M.D.   On: 03/17/2015 10:43   Dg Chest Portable 1 View  03/07/2015   CLINICAL DATA:  Bilateral lower extremity edema; shortness of breath  EXAM: PORTABLE CHEST - 1 VIEW  COMPARISON:  February 06, 2015  FINDINGS: There is scarring in the left base region. There is no edema or consolidation. Heart size and pulmonary vascularity are normal. No adenopathy. No bone lesions.  IMPRESSION: Scarring left base.  No edema or consolidation.   Electronically Signed   By: Lowella Grip III M.D.   On: 03/07/2015 11:15   Dg Bone Survey Met  03/18/2015   CLINICAL DATA:  81 year old with proteinuria and hypoalbuminemia. Evaluate for multiple myeloma as the cause for such.  EXAM: METASTATIC BONE SURVEY  COMPARISON:  Two-view chest x-ray yesterday and earlier. Maxillofacial CT 08/22/2011. CT head and cervical spine 12/30/2008. Left tibia fibula 02/10/2008.  FINDINGS: The following bones were  imaged:  Lateral skull: No lytic lesions to suggest myeloma. Normal in appearance.  AP and lateral cervical spine: No lytic lesions to suggest myeloma. Disc space narrowing at C4-5 and C5-6. Facet degenerative changes at C4-5, C5-6 and C6-7. Likely degenerative retrolisthesis of C5 relative to C6 measuring 3 mm and degenerative spondylolisthesis of C6 on C7 measuring 3 mm. Normal prevertebral soft tissues. Note made of bilateral carotid calcification.  PA chest for ribs: No lytic lesions to suggest myeloma. Linear scar or atelectasis at the left lung base, unchanged since yesterday. Small bilateral pleural effusions, unchanged. No new pulmonary parenchymal abnormalities.  AP views of  both shoulders: No lytic lesions to suggest myeloma. Slight narrowing of the subacromial space in the right shoulder and mild degenerative changes in the right acromioclavicular joint. No significant abnormality involving the left shoulder.  AP views of both humeri: No lytic lesions to suggest myeloma. Well preserved bone mineral density for age.  AP views of both forearms: No lytic lesions to suggest myeloma. Symmetric mild radiocarpal joint space narrowing in both wrists. Visualized portions of both elbow joints intact.  AP and lateral thoracic spine: 12 rib-bearing thoracic vertebrae with anatomic alignment. Exaggeration of the usual thoracic kyphosis. No visible lytic lesions to suggest myeloma. Intact pedicles. Disc space narrowing and endplate hypertrophic changes at multiple mid and lower thoracic levels.  AP and lateral lumbar spine: Five non rib-bearing lumbar vertebrae. Lumbar scoliosis convex left. Disc space narrowing and endplate hypertrophic changes at every lumbar level. Grade 1 spondylolisthesis of L4 on L5 approximating 8 mm, likely degenerative. Aortoiliac atherosclerosis.  AP pelvis: No lytic lesions to suggest myeloma. Moderate joint space narrowing involving the left hip. Well preserved joint space in the right hip.  Sacroiliac joints and symphysis pubis intact with mild degenerative changes.  AP views of both femora: No lytic lesions to suggest myeloma. Bone mineral density well-preserved.  AP views of both tibia fibula: No lytic lesions to suggest myeloma. Mild degenerative changes involving the visualized right knee joint. Visualized left knee joint and both ankle joints intact.  IMPRESSION: 1. No evidence of multiple myeloma. 2. Degenerative changes as detailed above.   Electronically Signed   By: Evangeline Dakin M.D.   On: 03/18/2015 14:18    CBC  Recent Labs Lab 03/17/15 1000 03/18/15 0405 03/19/15 0413  WBC 7.3 5.7 6.5  HGB 11.1* 10.0* 10.0*  HCT 33.5* 30.0* 30.5*  PLT 354 337 320  MCV 90.1 90.4 90.5  MCH 29.8 30.1 29.7  MCHC 33.1 33.3 32.8  RDW 14.5 14.7 14.6  LYMPHSABS 1.3  --   --   MONOABS 0.9  --   --   EOSABS 0.2  --   --   BASOSABS 0.1  --   --     Chemistries   Recent Labs Lab 03/17/15 1000 03/18/15 0405 03/19/15 0413  NA 133* 136 135  K 5.2* 4.4 4.3  CL 98 102 100  CO2 $Re'27 28 28  'QQb$ GLUCOSE 90 96 102*  BUN 37* 37* 36*  CREATININE 1.18* 1.17* 1.12*  CALCIUM 8.1* 7.9* 7.7*  AST $Re'25 21 19  'BRe$ ALT $R'10 10 9  'Vp$ ALKPHOS 59 52 50  BILITOT 0.9 0.5 0.4   ------------------------------------------------------------------------------------------------------------------ estimated creatinine clearance is 36.8 mL/min (by C-G formula based on Cr of 1.12). ------------------------------------------------------------------------------------------------------------------ No results for input(s): HGBA1C in the last 72 hours. ------------------------------------------------------------------------------------------------------------------ No results for input(s): CHOL, HDL, LDLCALC, TRIG, CHOLHDL, LDLDIRECT in the last 72 hours. ------------------------------------------------------------------------------------------------------------------  Recent Labs  03/17/15 1000  TSH 13.529*    ------------------------------------------------------------------------------------------------------------------  Recent Labs  03/17/15 1620  VITAMINB12 493  FERRITIN 268  TIBC 121*  IRON 32*    Coagulation profile  Recent Labs Lab 03/18/15 0405  INR 1.00    No results for input(s): DDIMER in the last 72 hours.  Cardiac Enzymes No results for input(s): CKMB, TROPONINI, MYOGLOBIN in the last 168 hours.  Invalid input(s): CK ------------------------------------------------------------------------------------------------------------------ Invalid input(s): POCBNP  No results for input(s): GLUCAP in the last 72 hours.   Bryant Saye M.D. [facility] 03/19/2015, 11:33 AM  Pager: [phone]   Between 7am to 7pm - call Pager -  [phone]  After 7pm go to [email or web address] - password [email or web address]  Call night coverage person covering after 7pm

## 2015-03-19 NOTE — Consult Note (Signed)
Chief Complaint: Chief Complaint  Patient presents with  . Leg Swelling  . Nephrotic Syndrome    Referring Physician(s): Dr Arty Baumgartner [facility]  History of Present Illness: Erika [name] is a 81 y.o. female  Pt with anasarca No improvement with diuretics Work up for HF Renal consult: nephrotic syndrome Increased renal functions Now request for random renal bx I have seen and examined pt Scheduled for 4/25 in Rad  Past Medical History  Diagnosis Date  . Hypertension   . Hyperlipidemia   . Myocardial infarction 1990  . CAD (coronary artery disease)     post MI in 45  . Osteopenia   . Hypothyroidism   . Fall Sept 2012    with left zygoma fracture  . Normal nuclear stress test July 2012    No ischemia.   . Right bundle branch block     Past Surgical History  Procedure Laterality Date  . Cardiac catheterization  10/18/1994    Single vessel disease with a severe stenosis in the RCA which had recannalized since prior study in 1990. Managed medically  . Cardiac catheterization  09/22/89  . Cataract extraction, bilateral  10/2009    Allergies: Review of patient's allergies indicates no known allergies.  Medications: Prior to Admission medications   Medication Sig Start Date End Date Taking? Authorizing Provider  acetaminophen (TYLENOL) 325 MG tablet Take 650 mg by mouth every 6 (six) hours as needed for mild pain or moderate pain.   Yes Historical Provider, MD  atorvastatin (LIPITOR) 20 MG tablet Take 20 mg by mouth daily.     Yes Historical Provider, MD  denosumab (PROLIA) 60 MG/ML SOLN injection Inject 60 mg into the skin every 6 (six) months. Administer in upper arm, thigh, or abdomen *pt had this administered last 02/20/15   Yes Historical Provider, MD  ezetimibe (ZETIA) 10 MG tablet Take 1 tablet (10 mg total) by mouth daily. 10/30/12  Yes Burtis Junes, NP  furosemide (LASIX) 40 MG tablet Take 40-60 mg by mouth 2 (two) times daily. 60 mg in the morning and 40  mg in the evening   Yes Historical Provider, MD  levothyroxine (SYNTHROID, LEVOTHROID) 25 MCG tablet Take 25 mcg by mouth daily before breakfast.   Yes Historical Provider, MD  lisinopril (PRINIVIL,ZESTRIL) 40 MG tablet Take 40 mg by mouth daily.     Yes Historical Provider, MD  levothyroxine (SYNTHROID, LEVOTHROID) 75 MCG tablet Take 75 mcg by mouth daily before breakfast.    Historical Provider, MD     Family History  Problem Relation Age of Onset  . Heart attack Father 17  . Heart attack Mother 36  . Heart disease Brother   . Heart disease Brother     History   Social History  . Marital Status: Married    Spouse Name: N/A  . Number of Children: N/A  . Years of Education: N/A   Social History Main Topics  . Smoking status: Never Smoker   . Smokeless tobacco: Never Used  . Alcohol Use: No  . Drug Use: No  . Sexual Activity: Yes   Other Topics Concern  . None   Social History Narrative     Review of Systems: A 12 point ROS discussed and pertinent positives are indicated in the HPI above.  All other systems are negative.  Review of Systems  Constitutional: Negative for fever, activity change and appetite change.  Respiratory: Negative for cough, chest tightness and shortness of breath.  Cardiovascular: Positive for leg swelling. Negative for chest pain.  Skin: Negative for color change.  Neurological: Negative for weakness.  Psychiatric/Behavioral: Negative for behavioral problems and confusion.    Vital Signs: BP 141/66 mmHg  Pulse 87  Temp(Src) 98.4 F (36.9 C) (Oral)  Resp 17  Ht $R'5\' 5"'ve$  (1.651 m)  Wt 70.6 kg (155 lb 10.3 oz)  BMI 25.90 kg/m2  SpO2 97%  Physical Exam  Constitutional: She is oriented to person, place, and time. She appears well-nourished.  Cardiovascular: Normal rate, regular rhythm and normal heart sounds.   Pulmonary/Chest: Effort normal and breath sounds normal. She has no wheezes.  Abdominal: Soft. Bowel sounds are normal.    Musculoskeletal: Normal range of motion. She exhibits edema.  Weeping legs  Neurological: She is oriented to person, place, and time.  Skin: Skin is dry.  Psychiatric: She has a normal mood and affect. Her behavior is normal. Judgment and thought content normal.  Nursing note and vitals reviewed.   Mallampati Score:  MD Evaluation Airway: WNL Heart: WNL Abdomen: WNL Chest/ Lungs: WNL ASA  Classification: 3 Mallampati/Airway Score: One  Imaging: Dg Chest 2 View  03/17/2015   CLINICAL DATA:  Lower extremity edema and nephrotic syndrome.  EXAM: CHEST - 2 VIEW  COMPARISON:  03/07/2015  FINDINGS: The heart size and mediastinal contours are within normal limits. Left basilar atelectasis and scarring remain. There are small bilateral pleural effusions without overt edema. No focal nodules identified. The spine demonstrates osteopenia and degenerative changes of the thoracic spine.  IMPRESSION: Left basilar atelectasis and scarring. Bilateral small pleural effusions without overt pulmonary edema.   Electronically Signed   By: Aletta Edouard M.D.   On: 03/17/2015 10:43   Dg Chest Portable 1 View  03/07/2015   CLINICAL DATA:  Bilateral lower extremity edema; shortness of breath  EXAM: PORTABLE CHEST - 1 VIEW  COMPARISON:  February 06, 2015  FINDINGS: There is scarring in the left base region. There is no edema or consolidation. Heart size and pulmonary vascularity are normal. No adenopathy. No bone lesions.  IMPRESSION: Scarring left base.  No edema or consolidation.   Electronically Signed   By: Lowella Grip III M.D.   On: 03/07/2015 11:15   Dg Bone Survey Met  03/18/2015   CLINICAL DATA:  81 year old with proteinuria and hypoalbuminemia. Evaluate for multiple myeloma as the cause for such.  EXAM: METASTATIC BONE SURVEY  COMPARISON:  Two-view chest x-ray yesterday and earlier. Maxillofacial CT 08/22/2011. CT head and cervical spine 12/30/2008. Left tibia fibula 02/10/2008.  FINDINGS: The  following bones were imaged:  Lateral skull: No lytic lesions to suggest myeloma. Normal in appearance.  AP and lateral cervical spine: No lytic lesions to suggest myeloma. Disc space narrowing at C4-5 and C5-6. Facet degenerative changes at C4-5, C5-6 and C6-7. Likely degenerative retrolisthesis of C5 relative to C6 measuring 3 mm and degenerative spondylolisthesis of C6 on C7 measuring 3 mm. Normal prevertebral soft tissues. Note made of bilateral carotid calcification.  PA chest for ribs: No lytic lesions to suggest myeloma. Linear scar or atelectasis at the left lung base, unchanged since yesterday. Small bilateral pleural effusions, unchanged. No new pulmonary parenchymal abnormalities.  AP views of both shoulders: No lytic lesions to suggest myeloma. Slight narrowing of the subacromial space in the right shoulder and mild degenerative changes in the right acromioclavicular joint. No significant abnormality involving the left shoulder.  AP views of both humeri: No lytic lesions to suggest myeloma. Well preserved  bone mineral density for age.  AP views of both forearms: No lytic lesions to suggest myeloma. Symmetric mild radiocarpal joint space narrowing in both wrists. Visualized portions of both elbow joints intact.  AP and lateral thoracic spine: 12 rib-bearing thoracic vertebrae with anatomic alignment. Exaggeration of the usual thoracic kyphosis. No visible lytic lesions to suggest myeloma. Intact pedicles. Disc space narrowing and endplate hypertrophic changes at multiple mid and lower thoracic levels.  AP and lateral lumbar spine: Five non rib-bearing lumbar vertebrae. Lumbar scoliosis convex left. Disc space narrowing and endplate hypertrophic changes at every lumbar level. Grade 1 spondylolisthesis of L4 on L5 approximating 8 mm, likely degenerative. Aortoiliac atherosclerosis.  AP pelvis: No lytic lesions to suggest myeloma. Moderate joint space narrowing involving the left hip. Well preserved joint  space in the right hip. Sacroiliac joints and symphysis pubis intact with mild degenerative changes.  AP views of both femora: No lytic lesions to suggest myeloma. Bone mineral density well-preserved.  AP views of both tibia fibula: No lytic lesions to suggest myeloma. Mild degenerative changes involving the visualized right knee joint. Visualized left knee joint and both ankle joints intact.  IMPRESSION: 1. No evidence of multiple myeloma. 2. Degenerative changes as detailed above.   Electronically Signed   By: Evangeline Dakin M.D.   On: 03/18/2015 14:18    Labs:  CBC:  Recent Labs  03/07/15 1100 03/17/15 1000 03/18/15 0405 03/19/15 0413  WBC 8.2 7.3 5.7 6.5  HGB 11.9* 11.1* 10.0* 10.0*  HCT 36.0 33.5* 30.0* 30.5*  PLT 342 354 337 320    COAGS:  Recent Labs  03/18/15 0405  INR 1.00    BMP:  Recent Labs  03/07/15 1100 03/17/15 1000 03/18/15 0405 03/19/15 0413  NA 136 133* 136 135  K 5.2* 5.2* 4.4 4.3  CL 100 98 102 100  CO2 $Re'29 27 28 28  'KsE$ GLUCOSE 116* 90 96 102*  BUN 27* 37* 37* 36*  CALCIUM 8.5 8.1* 7.9* 7.7*  CREATININE 1.06 1.18* 1.17* 1.12*  GFRNONAA 47* 41* 42* 44*  GFRAA 54* 48* 48* 51*    LIVER FUNCTION TESTS:  Recent Labs  03/07/15 1100 03/17/15 1000 03/18/15 0405 03/19/15 0413  BILITOT 0.2* 0.9 0.5 0.4  AST $Re'22 25 21 19  'kqb$ ALT $R'11 10 10 9  'uY$ ALKPHOS 62 59 52 50  PROT 4.9* 5.0* 4.1* 4.0*  ALBUMIN 1.1* 1.1* <1.0* <1.0*    TUMOR MARKERS: No results for input(s): AFPTM, CEA, CA199, CHROMGRNA in the last 8760 hours.  Assessment and Plan:  Anasarca Increased renal functions No significant improvement with Lasix Admitted for work up Renal MD dx nephrotic syndrome Now scheduled for random renal bx in IR Risks and Benefits discussed with the patient including, but not limited to bleeding, infection, damage to adjacent structures or low yield requiring additional tests. All of the patient's questions were answered, patient is agreeable to  proceed. Consent signed and in chart.   Thank you for this interesting consult.  I greatly enjoyed meeting Kaysea B Klassen and look forward to participating in their care.  Signed: Deantre Bourdon A 03/19/2015, 9:42 AM   I spent a total of 20 Minutes  in face to face in clinical consultation, greater than 50% of which was counseling/coordinating care for random renal bx

## 2015-03-19 NOTE — Progress Notes (Signed)
Orthopedic Tech Progress Note Patient Details:  Erika [name] Jul 17, 1928 [id number]  Ortho Devices Type of Ortho Device: Louretta Parma boot Ortho Device/Splint Location: Bilateral Ortho Device/Splint Interventions: Application   Asia R Thompson 03/19/2015, 4:00 PM

## 2015-03-20 ENCOUNTER — Inpatient Hospital Stay (HOSPITAL_COMMUNITY): Payer: Medicare Other

## 2015-03-20 LAB — RENAL FUNCTION PANEL
ANION GAP: 8 (ref 5–15)
Albumin: <1 g/dL — ABNORMAL LOW (ref 3.5–5.2)
BUN: 38 mg/dL — AB (ref 6–23)
CO2: 27 mmol/L (ref 19–32)
Calcium: 7.5 mg/dL — ABNORMAL LOW (ref 8.4–10.5)
Chloride: 97 mmol/L (ref 96–112)
Creatinine, Ser: 1.23 mg/dL — ABNORMAL HIGH (ref 0.50–1.10)
GFR calc non Af Amer: 39 mL/min — ABNORMAL LOW (ref >90)
GFR, EST AFRICAN AMERICAN: 45 mL/min — AB (ref >90)
Glucose, Bld: 94 mg/dL (ref 70–99)
POTASSIUM: 4 mmol/L (ref 3.5–5.1)
Phosphorus: 4.5 mg/dL (ref 2.3–4.6)
Sodium: 132 mmol/L — ABNORMAL LOW (ref 135–145)

## 2015-03-20 LAB — ANCA TITERS: P-ANCA: <1:20 {titer}

## 2015-03-20 LAB — CBC
HCT: 29.8 % — ABNORMAL LOW (ref 36.0–46.0)
HEMOGLOBIN: 9.9 g/dL — AB (ref 12.0–15.0)
MCH: 30.1 pg (ref 26.0–34.0)
MCHC: 33.2 g/dL (ref 30.0–36.0)
MCV: 90.6 fL (ref 78.0–100.0)
PLATELETS: 314 10*3/uL (ref 150–400)
RBC: 3.29 MIL/uL — ABNORMAL LOW (ref 3.87–5.11)
RDW: 14.5 % (ref 11.5–15.5)
WBC: 5.7 10*3/uL (ref 4.0–10.5)

## 2015-03-20 LAB — LIPID PANEL
CHOL/HDL RATIO: 5.8 ratio
Cholesterol: 269 mg/dL — ABNORMAL HIGH (ref 0–200)
HDL: 46 mg/dL (ref >39)
LDL Cholesterol: 177 mg/dL — ABNORMAL HIGH (ref 0–99)
Triglycerides: 231 mg/dL — ABNORMAL HIGH (ref <150)
VLDL: 46 mg/dL — AB (ref 0–40)

## 2015-03-20 LAB — FOLATE RBC
FOLATE, HEMOLYSATE: 535.4 ng/mL
FOLATE, RBC: 1627 ng/mL (ref >498)
Hematocrit: 32.9 % — ABNORMAL LOW (ref 34.0–46.6)

## 2015-03-20 LAB — HEPATITIS C ANTIBODY: HCV Ab: NEGATIVE

## 2015-03-20 LAB — GLOMERULAR BASEMENT MEMBRANE ANTIBODIES: GBM Ab: 3 units (ref 0–20)

## 2015-03-20 LAB — MPO/PR-3 (ANCA) ANTIBODIES: Myeloperoxidase Abs: <9 U/mL (ref 0.0–9.0)

## 2015-03-20 MED ORDER — FENTANYL CITRATE (PF) 100 MCG/2ML IJ SOLN
INTRAMUSCULAR | Status: AC | PRN
  Administered 2015-03-20: 25 ug via INTRAVENOUS

## 2015-03-20 MED ORDER — LIDOCAINE HCL (PF) 1 % IJ SOLN
INTRAMUSCULAR | Status: AC
  Filled 2015-03-20: qty 10

## 2015-03-20 MED ORDER — MIDAZOLAM HCL 2 MG/2ML IJ SOLN
INTRAMUSCULAR | Status: AC | PRN
  Administered 2015-03-20: 0.5 mg via INTRAVENOUS

## 2015-03-20 MED ORDER — HYDRALAZINE HCL 20 MG/ML IJ SOLN
10.0000 mg | Freq: Once | INTRAMUSCULAR | Status: AC
Start: 2015-03-20 — End: 2015-03-20
  Administered 2015-03-20: 10 mg via INTRAVENOUS
  Filled 2015-03-20 (×2): qty 0.5

## 2015-03-20 MED ORDER — FENTANYL CITRATE (PF) 100 MCG/2ML IJ SOLN
INTRAMUSCULAR | Status: AC
  Filled 2015-03-20: qty 2

## 2015-03-20 MED ORDER — SODIUM CHLORIDE 0.9 % IV SOLN
INTRAVENOUS | Status: AC | PRN
  Administered 2015-03-20: 10 mL/h via INTRAVENOUS

## 2015-03-20 MED ORDER — FUROSEMIDE 10 MG/ML IJ SOLN
80.0000 mg | Freq: Three times a day (TID) | INTRAMUSCULAR | Status: DC
  Administered 2015-03-20 – 2015-03-21 (×5): 80 mg via INTRAVENOUS
  Filled 2015-03-20 (×6): qty 8

## 2015-03-20 MED ORDER — MIDAZOLAM HCL 2 MG/2ML IJ SOLN
INTRAMUSCULAR | Status: AC
  Filled 2015-03-20: qty 2

## 2015-03-20 NOTE — Progress Notes (Signed)
Admit: 03/17/2015 LOS: 3  47F with nephrotic syndrome, UP/C = 11   Subjective:  For renal Bx today Still edematous Weight not down trending clearly Not robust response to 40mg  IV lasix   04/24 0701 - 04/25 0700 In: 630 [P.O.:630] Out: 400 [Urine:400]  Filed Weights   03/17/15 1309 03/19/15 0500 03/20/15 0431  Weight: 72.1 kg (158 lb 15.2 oz) 70.6 kg (155 lb 10.3 oz) 71.2 kg (156 lb 15.5 oz)    Scheduled Meds: . [START ON 03/21/2015] enoxaparin (LOVENOX) injection  40 mg Subcutaneous Q24H  . ezetimibe  10 mg Oral Daily  . furosemide  40 mg Intravenous BID  . levothyroxine  75 mcg Oral QAC breakfast  . sodium chloride  3 mL Intravenous Q12H   Continuous Infusions:  PRN Meds:.acetaminophen **OR** acetaminophen, ondansetron **OR** ondansetron (ZOFRAN) IV, sodium chloride  Current Labs: reviewed    Physical Exam:  Blood pressure 174/60, pulse 68, temperature 98.1 F (36.7 C), temperature source Oral, resp. rate 18, height 5\' 5"  (1.651 m), weight 71.2 kg (156 lb 15.5 oz), SpO2 96 %. NAD, lying in bed 3-4+ pitting edema to knees; compression hose on CTAB RRR, no mgr No rashes/lesions Nonfocal  A/P 1. Nephrotic Syndrome with Hypervolemia 1. Etiology 1. 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 Expand serological w/u with HIV, HCV, HBV, Complement, ANA, A1c, SPEP, sFLC 3. Renal Bx today, form in chart 4. Will need ARB once GFR stable; ? Angioedema vs anasarca while on ACEi 2. Volume management with Hypoalbuminemia 1. Increase lasix to 80 IV TID 2. Low Na diet Daily weights, Daily Renal Panel, Strict I/Os, Avoid nephrotoxins (NSAIDs, judicious IV Contrast) 1. HTN: follow with volume management 2. Anemia: stable; monitor; w/u as above 3. Hypoalbuminemia: can't consider VTE prophylaxis 2/2 renal biopsy today  Pearson Grippe MD 03/20/2015, 9:50 AM   Recent Labs Lab 03/18/15 0405 03/19/15 0413 03/20/15 0530  NA 136 135 132*  K 4.4 4.3 4.0  CL 102 100  97  CO2 28 28 27   GLUCOSE 96 102* 94  BUN 37* 36* 38*  CREATININE 1.17* 1.12* 1.23*  CALCIUM 7.9* 7.7* 7.5*  PHOS  --   --  4.5    Recent Labs Lab 03/17/15 1000 03/18/15 0405 03/19/15 0413 03/20/15 0530  WBC 7.3 5.7 6.5 5.7  NEUTROABS 4.8  --   --   --   HGB 11.1* 10.0* 10.0* 9.9*  HCT 33.5* 30.0* 30.5* 29.8*  MCV 90.1 90.4 90.5 90.6  PLT 354 337 320 314

## 2015-03-20 NOTE — Procedures (Signed)
Procedure:  Ultrasound guided core biopsy of left kidney Findings:  Two 16 G core samples obtained of lower pole cortex. No complications. EBL < 25 mL

## 2015-03-20 NOTE — Sedation Documentation (Signed)
Patient denies pain and is resting comfortably.  

## 2015-03-20 NOTE — Progress Notes (Signed)
Pt BP this AM 178/70-manual.  PA Baltazar Najjar paged and notified.  No new orders at this time.  Will continue to monitor pt closely.  Claudette Stapler, RN

## 2015-03-20 NOTE — Sedation Documentation (Signed)
At 1215 uncomfortable with position, nauseated, feels like can't breath. Heart rate dropped into to 45. BP71/42, slightly diaphoretic. Repositioned with good effect, feels like can breath and comfortable, heart rate in 60's and BP at 1218 133/58. IV fluids were turned up for 2 minutes but back to [facility].

## 2015-03-20 NOTE — Progress Notes (Signed)
[facility]                                                                              Patient Demographics  Erika [name], is a 81 y.o. female, DOB - [birth year], YHC:[id number]  Admit date - 03/17/2015   Admitting Physician Elmarie Shiley, MD  Outpatient Primary MD for the patient is Precious Reel, MD  LOS - 3   Chief Complaint  Patient presents with  . Leg Swelling  . Nephrotic Syndrome       Brief HPI   Erika [name] is a 81 y.o. female with PMH significant for HTN, HLD, seeing by PCP for anasarca, hypoalbuminemia, has received lasix without significant improvement of volume status, she was evaluated by her cardiology for HF, and also had doppler LE negative for DVT. Her PCP has been doing a work up for anasarca and hypoalbuminemia. She continued to accumulate Fluids, for this reason she presented to the ED. It has been difficult to work, due to swelling. She denied chest pain , dyspnea, diarrhea, or abdominal pain. Patient also reported being refractory to oral Lasix and had developed eye and lip spelling of the day of admission.She has also had initial workup for nephrotic syndrome started by her PCP, Dr. Shon Baton who noted significant proteinuria on Urinalysis and 24 hour urine protein of 2.5grams, low albumin, normal complements and SPEP.   Assessment & Plan    Principal Problem:   Anasarca with hypoalbuminemia, proteinuria,:? Nephrotic syndrome, multiple myeloma, myxedema, glomerulonephritis - Patient started on IV diuresis, nephrology consulted - Serologies pending, ASO titer negative - Bone survey negative for multiple myeloma or any lytic lesions - Renal biopsy today - UNNA boats placed for peripheral edema  Active Problems:   Hypothyroidism-labs 01/2015. - Patient reported that she was started on Synthroid 4 weeks ago, currently on 25 MCG daily, and was supposed to start on 75 MCG on 4/23 -  will continue Synthroid at the current dose, T4  0.82   Anemia: Mixed normocytic and iron deficiency anemia - SPEP, UPEP pending, - Skeletal survey negative for multiple myeloma, stool occult test pending - May need bone marrow biopsy    Hyponatremia, Hyperkalemia -Monitor closely   Code Status: full code  Family Communication: Discussed in detail with the patient, all imaging results, lab results explained to the patient and husband   Disposition Plan: pending further evaluation  Time Spent in minutes  25 minutes  Procedures  none  Consults   renal  DVT Prophylaxis   Lovenox   Medications  Scheduled Meds: . [START ON 03/21/2015] enoxaparin (LOVENOX) injection  40 mg Subcutaneous Q24H  . ezetimibe  10 mg Oral Daily  . furosemide  80 mg Intravenous TID  . levothyroxine  75 mcg Oral QAC breakfast  . sodium chloride  3 mL Intravenous Q12H   Continuous Infusions:  PRN Meds:.acetaminophen **OR** acetaminophen, ondansetron **OR** ondansetron (ZOFRAN) IV, sodium chloride   Antibiotics   Anti-infectives    None        Subjective:   [name] was seen and examined today.  Patient denies dizziness, chest pain, shortness of breath,  abdominal pain, N/V/D/C, new weakness, numbess, tingling. No acute events overnight. UNNA boats placed yesterday, the patient feels that facial edema, swelling around the eyes and abdominal distention has also improved since starting IV Lasix  Objective:   Blood pressure 155/57, pulse 68, temperature 98.1 F (36.7 C), temperature source Oral, resp. rate 18, height _0  (1.651 m), weight 71.2 kg (156 lb 15.5 oz), SpO2 96 %.  Wt Readings from Last 3 Encounters:  03/20/15 71.2 kg (156 lb 15.5 oz)  03/07/15 70.052 kg (154 lb 7 oz)  02/17/15 66.497 kg (146 lb 9.6 oz)     Intake/Output Summary (Last 24 hours) at 03/20/15 1114 Last data filed at 03/19/15 1300  Gross per 24 hour  Intake    240 ml  Output      0 ml  Net    240 ml    Exam  General: Alert and oriented x 3,  NAD  HEENT:  PERRLA, EOMI, Anicteic Sclera, mucous membranes moist.   Neck: Supple, no JVD  CVS: S1 S2 clear, no mrg  Respiratory: CTAB  Abdomen: Soft, nontender, nondistended, + bowel sounds  Ext: no cyanosis clubbing,b/l legs wrapped  Neuro: AAOx3, Cr N's II- XII. Strength 5/5 upper and lower extremities bilaterally  Skin: No rashes  Psych: Normal affect and demeanor, alert and oriented x3    Data Review   Micro Results No results found for this or any previous visit (from the past 240 hour(s)).  Radiology Reports Dg Chest 2 View  03/17/2015   CLINICAL DATA:  Lower extremity edema and nephrotic syndrome.  EXAM: CHEST - 2 VIEW  COMPARISON:  03/07/2015  FINDINGS: The heart size and mediastinal contours are within normal limits. Left basilar atelectasis and scarring remain. There are small bilateral pleural effusions without overt edema. No focal nodules identified. The spine demonstrates osteopenia and degenerative changes of the thoracic spine.  IMPRESSION: Left basilar atelectasis and scarring. Bilateral small pleural effusions without overt pulmonary edema.   Electronically Signed   By: Aletta Edouard M.D.   On: 03/17/2015 10:43   Dg Chest Portable 1 View  03/07/2015   CLINICAL DATA:  Bilateral lower extremity edema; shortness of breath  EXAM: PORTABLE CHEST - 1 VIEW  COMPARISON:  February 06, 2015  FINDINGS: There is scarring in the left base region. There is no edema or consolidation. Heart size and pulmonary vascularity are normal. No adenopathy. No bone lesions.  IMPRESSION: Scarring left base.  No edema or consolidation.   Electronically Signed   By: Lowella Grip III M.D.   On: 03/07/2015 11:15   Dg Bone Survey Met  03/18/2015   CLINICAL DATA:  81 year old with proteinuria and hypoalbuminemia. Evaluate for multiple myeloma as the cause for such.  EXAM: METASTATIC BONE SURVEY  COMPARISON:  Two-view chest x-ray yesterday and earlier. Maxillofacial CT 08/22/2011. CT head and  cervical spine 12/30/2008. Left tibia fibula 02/10/2008.  FINDINGS: The following bones were imaged:  Lateral skull: No lytic lesions to suggest myeloma. Normal in appearance.  AP and lateral cervical spine: No lytic lesions to suggest myeloma. Disc space narrowing at C4-5 and C5-6. Facet degenerative changes at C4-5, C5-6 and C6-7. Likely degenerative retrolisthesis of C5 relative to C6 measuring 3 mm and degenerative spondylolisthesis of C6 on C7 measuring 3 mm. Normal prevertebral soft tissues. Note made of bilateral carotid calcification.  PA chest for ribs: No lytic lesions to suggest myeloma. Linear scar or atelectasis at the left lung base, unchanged since yesterday. Small  bilateral pleural effusions, unchanged. No new pulmonary parenchymal abnormalities.  AP views of both shoulders: No lytic lesions to suggest myeloma. Slight narrowing of the subacromial space in the right shoulder and mild degenerative changes in the right acromioclavicular joint. No significant abnormality involving the left shoulder.  AP views of both humeri: No lytic lesions to suggest myeloma. Well preserved bone mineral density for age.  AP views of both forearms: No lytic lesions to suggest myeloma. Symmetric mild radiocarpal joint space narrowing in both wrists. Visualized portions of both elbow joints intact.  AP and lateral thoracic spine: 12 rib-bearing thoracic vertebrae with anatomic alignment. Exaggeration of the usual thoracic kyphosis. No visible lytic lesions to suggest myeloma. Intact pedicles. Disc space narrowing and endplate hypertrophic changes at multiple mid and lower thoracic levels.  AP and lateral lumbar spine: Five non rib-bearing lumbar vertebrae. Lumbar scoliosis convex left. Disc space narrowing and endplate hypertrophic changes at every lumbar level. Grade 1 spondylolisthesis of L4 on L5 approximating 8 mm, likely degenerative. Aortoiliac atherosclerosis.  AP pelvis: No lytic lesions to suggest myeloma.  Moderate joint space narrowing involving the left hip. Well preserved joint space in the right hip. Sacroiliac joints and symphysis pubis intact with mild degenerative changes.  AP views of both femora: No lytic lesions to suggest myeloma. Bone mineral density well-preserved.  AP views of both tibia fibula: No lytic lesions to suggest myeloma. Mild degenerative changes involving the visualized right knee joint. Visualized left knee joint and both ankle joints intact.  IMPRESSION: 1. No evidence of multiple myeloma. 2. Degenerative changes as detailed above.   Electronically Signed   By: Evangeline Dakin M.D.   On: 03/18/2015 14:18    CBC  Recent Labs Lab 03/17/15 1000 03/18/15 0405 03/19/15 0413 03/20/15 0530  WBC 7.3 5.7 6.5 5.7  HGB 11.1* 10.0* 10.0* 9.9*  HCT 33.5* 30.0* 30.5* 29.8*  PLT 354 337 320 314  MCV 90.1 90.4 90.5 90.6  MCH 29.8 30.1 29.7 30.1  MCHC 33.1 33.3 32.8 33.2  RDW 14.5 14.7 14.6 14.5  LYMPHSABS 1.3  --   --   --   MONOABS 0.9  --   --   --   EOSABS 0.2  --   --   --   BASOSABS 0.1  --   --   --     Chemistries   Recent Labs Lab 03/17/15 1000 03/18/15 0405 03/19/15 0413 03/20/15 0530  NA 133* 136 135 132*  K 5.2* 4.4 4.3 4.0  CL 98 102 100 97  CO2 _0 GLUCOSE 90 96 102* 94  BUN 37* 37* 36* 38*  CREATININE 1.18* 1.17* 1.12* 1.23*  CALCIUM 8.1* 7.9* 7.7* 7.5*  AST _1 --   ALT _2 --   ALKPHOS 59 52 50  --   BILITOT 0.9 0.5 0.4  --    ------------------------------------------------------------------------------------------------------------------ estimated creatinine clearance is 33.7 mL/min (by C-G formula based on Cr of 1.23). ------------------------------------------------------------------------------------------------------------------ No results for input(s): HGBA1C in the last 72 hours. ------------------------------------------------------------------------------------------------------------------ No results for  input(s): CHOL, HDL, LDLCALC, TRIG, CHOLHDL, LDLDIRECT in the last 72 hours. ------------------------------------------------------------------------------------------------------------------  Recent Labs  03/18/15 0405  T3FREE 1.5*   ------------------------------------------------------------------------------------------------------------------  Recent Labs  03/17/15 1620  VITAMINB12 493  FERRITIN 268  TIBC 121*  IRON 32*    Coagulation profile  Recent Labs Lab 03/18/15 0405  INR 1.00    No results for input(s): DDIMER in the last 72  hours.  Cardiac Enzymes No results for input(s): CKMB, TROPONINI, MYOGLOBIN in the last 168 hours.  Invalid input(s): CK ------------------------------------------------------------------------------------------------------------------ Invalid input(s): POCBNP  No results for input(s): GLUCAP in the last 72 hours.   RAI,RIPUDEEP M.D. [facility] 03/20/2015, 11:14 AM  Pager: [phone]   Between 7am to 7pm - call Pager - [phone]  After 7pm go to [email or web address] - password [email or web address]  Call night coverage person covering after 7pm

## 2015-03-20 NOTE — Progress Notes (Signed)
Utilization review completed. Debra Colon, RN, BSN. 

## 2015-03-20 NOTE — Sedation Documentation (Signed)
Dr Kathlene Cote aware patient heart went down 47 and SBP 90's. Patient comfortable at time. HR currently in the 60's and SBP 112. OK to transfer patient as per Dr Kathlene Cote.

## 2015-03-20 NOTE — Progress Notes (Signed)
My outpt labs just came back + for both High Free Kappa and Lamda Light chains but clearly a normal ratio.  Await Kidney Bx for her Nephrotic Syndrome.  May have Myeloma vrs Amyloid or Membranous GN or??  ! Free Kappa Lt Chains,S      [H]  57.66 mg/L                  3.30-19.40 ! Free Lambda Lt Chains,S   [H]  41.54 mg/L                  5.71-26.30 ! Kappa/Lambda Ratio,S      1.39                        0.26-1.65

## 2015-03-21 LAB — CBC
HCT: 34.7 % — ABNORMAL LOW (ref 36.0–46.0)
HEMOGLOBIN: 11.4 g/dL — AB (ref 12.0–15.0)
MCH: 29.5 pg (ref 26.0–34.0)
MCHC: 32.9 g/dL (ref 30.0–36.0)
MCV: 89.9 fL (ref 78.0–100.0)
Platelets: 389 10*3/uL (ref 150–400)
RBC: 3.86 MIL/uL — ABNORMAL LOW (ref 3.87–5.11)
RDW: 14.9 % (ref 11.5–15.5)
WBC: 7.6 10*3/uL (ref 4.0–10.5)

## 2015-03-21 LAB — UIFE/LIGHT CHAINS/TP QN, 24-HR UR
Albumin, U: DETECTED
Alpha 1, Urine: DETECTED — AB
Alpha 2, Urine: DETECTED — AB
BETA UR: DETECTED — AB
Gamma Globulin, Urine: DETECTED — AB
Total Protein, Urine: 682 mg/dL — ABNORMAL HIGH (ref 5–24)

## 2015-03-21 LAB — KAPPA/LAMBDA LIGHT CHAINS
Kappa free light chain: 47.87 mg/L — ABNORMAL HIGH (ref 3.30–19.40)
Kappa, lambda light chain ratio: 1.44 (ref 0.26–1.65)
LAMDA FREE LIGHT CHAINS: 33.32 mg/L — AB (ref 5.71–26.30)

## 2015-03-21 LAB — RENAL FUNCTION PANEL
ANION GAP: 10 (ref 5–15)
Albumin: 1.1 g/dL — ABNORMAL LOW (ref 3.5–5.2)
BUN: 39 mg/dL — ABNORMAL HIGH (ref 6–23)
CALCIUM: 7.7 mg/dL — AB (ref 8.4–10.5)
CO2: 24 mmol/L (ref 19–32)
CREATININE: 1.2 mg/dL — AB (ref 0.50–1.10)
Chloride: 99 mmol/L (ref 96–112)
GFR calc Af Amer: 47 mL/min — ABNORMAL LOW (ref >90)
GFR calc non Af Amer: 40 mL/min — ABNORMAL LOW (ref >90)
Glucose, Bld: 153 mg/dL — ABNORMAL HIGH (ref 70–99)
PHOSPHORUS: 4 mg/dL (ref 2.3–4.6)
Potassium: 3.9 mmol/L (ref 3.5–5.1)
SODIUM: 133 mmol/L — AB (ref 135–145)

## 2015-03-21 LAB — PROTEIN ELECTROPHORESIS, SERUM
A/G Ratio: 0.3 — ABNORMAL LOW (ref 0.7–2.0)
ALPHA-2-GLOBULIN: 1.8 g/dL — AB (ref 0.4–1.2)
Albumin ELP: 1 g/dL — ABNORMAL LOW (ref 3.2–5.6)
Alpha-1-Globulin: 0.2 g/dL (ref 0.1–0.4)
Beta Globulin: 0.5 g/dL — ABNORMAL LOW (ref 0.6–1.3)
Gamma Globulin: 0.5 g/dL (ref 0.5–1.6)
Globulin, Total: 3 g/dL (ref 2.0–4.5)
TOTAL PROTEIN ELP: 4 g/dL — AB (ref 6.0–8.5)

## 2015-03-21 LAB — ANTINUCLEAR ANTIBODIES, IFA: ANA Ab, IFA: NEGATIVE

## 2015-03-21 LAB — C3 COMPLEMENT: C3 Complement: 128 mg/dL (ref 82–167)

## 2015-03-21 LAB — C4 COMPLEMENT: COMPLEMENT C4, BODY FLUID: 56 mg/dL — AB (ref 14–44)

## 2015-03-21 LAB — HIV ANTIBODY (ROUTINE TESTING W REFLEX): HIV Screen 4th Generation wRfx: NONREACTIVE

## 2015-03-21 LAB — HEMOGLOBIN A1C
Hgb A1c MFr Bld: 6.2 % — ABNORMAL HIGH (ref 4.8–5.6)
Mean Plasma Glucose: 131 mg/dL

## 2015-03-21 MED ORDER — LOSARTAN POTASSIUM 25 MG PO TABS
12.5000 mg | ORAL_TABLET | Freq: Every day | ORAL | Status: DC
  Administered 2015-03-21: 12.5 mg via ORAL
  Filled 2015-03-21 (×2): qty 0.5

## 2015-03-21 NOTE — Progress Notes (Signed)
[facility]                                                                              Patient Demographics  Erika [name], is a 81 y.o. female, DOB - 11/30/1927, QIH:[id number]  Admit date - 03/17/2015   Admitting Physician Elmarie Shiley, MD  Outpatient Primary MD for the patient is Precious Reel, MD  LOS - 4   Chief Complaint  Patient presents with  . Leg Swelling  . Nephrotic Syndrome       Brief HPI   Erika [name] is a 81 y.o. female with PMH significant for HTN, HLD, seeing by PCP for anasarca, hypoalbuminemia, has received lasix without significant improvement of volume status, she was evaluated by her cardiology for HF, and also had doppler LE negative for DVT. Her PCP has been doing a work up for anasarca and hypoalbuminemia. She continued to accumulate Fluids, for this reason she presented to the ED. It has been difficult to work, due to swelling. She denied chest pain , dyspnea, diarrhea, or abdominal pain. Patient also reported being refractory to oral Lasix and had developed eye and lip spelling of the day of admission.She has also had initial workup for nephrotic syndrome started by her PCP, Dr. Shon Baton who noted significant proteinuria on Urinalysis and 24 hour urine protein of 2.5grams, low albumin, normal complements and SPEP.   Assessment & Plan    Principal Problem:   Anasarca with hypoalbuminemia, proteinuria,:? Nephrotic syndrome, multiple myeloma, myxedema, glomerulonephritis - Patient started on IV diuresis, nephrology consulted - ASO titer negative, C3 and C4 within normal limits, HIV negative, HCV negative, GBM antibody negative, ANCA PR3 and MPO negative, SPEP and UPEP in process - Bone survey negative for multiple myeloma or any lytic lesions - Renal biopsy done on 4/25, results pending  - UNNA boats placed for peripheral edema - Discussed in detail with Dr. Joelyn Oms today, recommended continuing IV diuresis  Active  Problems:   Hypothyroidism-labs 01/2015. - Patient reported that she was started on Synthroid 4 weeks ago, currently on 25 MCG daily, and was supposed to start on 75 MCG on 4/23 -  will continue Synthroid at the current dose, T4 0.82   Anemia: Mixed normocytic and iron deficiency anemia - SPEP, UPEP pending, - Skeletal survey negative for multiple myeloma, stool occult test pending - May need bone marrow biopsy    Hyponatremia, Hyperkalemia -Monitor closely   Code Status: full code  Family Communication: Discussed in detail with the patient, all imaging results, lab results explained to the patient and husband   Disposition Plan: pending further evaluation  Time Spent in minutes  25 minutes  Procedures  none  Consults   renal  DVT Prophylaxis   Lovenox   Medications  Scheduled Meds: . enoxaparin (LOVENOX) injection  40 mg Subcutaneous Q24H  . ezetimibe  10 mg Oral Daily  . furosemide  80 mg Intravenous TID  . levothyroxine  75 mcg Oral QAC breakfast  . sodium chloride  3 mL Intravenous Q12H   Continuous Infusions:  PRN Meds:.acetaminophen **OR** acetaminophen, ondansetron **OR** ondansetron (ZOFRAN) IV, sodium chloride  Antibiotics   Anti-infectives    None        Subjective:   Jalani Cullifer was seen and examined today. Facial edema, swelling around the eyes have improved. Legs wrapped. Denies  dizziness, chest pain, shortness of breath, abdominal pain, N/V/D/C, new weakness, numbess, tingling. No acute events overnight.   Objective:   Blood pressure 143/70, pulse 68, temperature 98.1 F (36.7 C), temperature source Oral, resp. rate 18, height _0  (1.651 m), weight 70.8 kg (156 lb 1.4 oz), SpO2 95 %.  Wt Readings from Last 3 Encounters:  03/21/15 70.8 kg (156 lb 1.4 oz)  03/07/15 70.052 kg (154 lb 7 oz)  02/17/15 66.497 kg (146 lb 9.6 oz)     Intake/Output Summary (Last 24 hours) at 03/21/15 1307 Last data filed at 03/21/15 1000  Gross per 24  hour  Intake    240 ml  Output      0 ml  Net    240 ml    Exam  General: Alert and oriented x 3, NAD  HEENT:  PERRLA, EOMI, Anicteic Sclera  Neck: Supple, no JVD  CVS: S1 S2 clear, RRR  Respiratory: Clear to auscultation bilaterally  Abdomen: Soft, nontender, nondistended, + bowel sounds  Ext: no cyanosis clubbing,b/l legs wrapped  Neuro: AAOx3, Cr N's II- XII. Strength 5/5 upper and lower extremities bilaterally  Skin: No rashes  Psych: Normal affect and demeanor, alert and oriented x3    Data Review   Micro Results No results found for this or any previous visit (from the past 240 hour(s)).  Radiology Reports Dg Chest 2 View  03/17/2015   CLINICAL DATA:  Lower extremity edema and nephrotic syndrome.  EXAM: CHEST - 2 VIEW  COMPARISON:  03/07/2015  FINDINGS: The heart size and mediastinal contours are within normal limits. Left basilar atelectasis and scarring remain. There are small bilateral pleural effusions without overt edema. No focal nodules identified. The spine demonstrates osteopenia and degenerative changes of the thoracic spine.  IMPRESSION: Left basilar atelectasis and scarring. Bilateral small pleural effusions without overt pulmonary edema.   Electronically Signed   By: Aletta Edouard M.D.   On: 03/17/2015 10:43   US Biopsy  03/20/2015   CLINICAL DATA:  Nephrotic syndrome and need for renal biopsy.  EXAM: ULTRASOUND GUIDED CORE BIOPSY OF LEFT KIDNEY  MEDICATIONS: 0.5 mg IV Versed; 25 mcg IV Fentanyl  Total Moderate Sedation Time: 9 minutes.  PROCEDURE: The procedure, risks, benefits, and alternatives were explained to the patient. Questions regarding the procedure were encouraged and answered. The patient understands and consents to the procedure.  A time-out was performed prior to the procedure. The left flank region was prepped with Betadine in a sterile fashion, and a sterile drape was applied covering the operative field. A sterile gown and sterile gloves  were used for the procedure. Local anesthesia was provided with 1% Lidocaine.  After localizing the left kidney, a 16 gauge core biopsy device was advanced to the level of the lower pole cortex. Two core biopsy samples were obtained and submitted in saline for pathologic analysis. Post biopsy imaging was performed by ultrasound.  COMPLICATIONS: None.  FINDINGS: The left kidney was well visualized and easier to see than the right kidney by ultrasound. Intact core biopsy samples were obtained of lower pole cortex. There were no immediate bleeding complications evident by ultrasound.  IMPRESSION: Ultrasound-guided core biopsy performed of the left kidney.   Electronically Signed   By: Jenness Corner.D.  On: 03/20/2015 15:45   Dg Chest Portable 1 View  03/07/2015   CLINICAL DATA:  Bilateral lower extremity edema; shortness of breath  EXAM: PORTABLE CHEST - 1 VIEW  COMPARISON:  February 06, 2015  FINDINGS: There is scarring in the left base region. There is no edema or consolidation. Heart size and pulmonary vascularity are normal. No adenopathy. No bone lesions.  IMPRESSION: Scarring left base.  No edema or consolidation.   Electronically Signed   By: Lowella Grip III M.D.   On: 03/07/2015 11:15   Dg Bone Survey Met  03/18/2015   CLINICAL DATA:  81 year old with proteinuria and hypoalbuminemia. Evaluate for multiple myeloma as the cause for such.  EXAM: METASTATIC BONE SURVEY  COMPARISON:  Two-view chest x-ray yesterday and earlier. Maxillofacial CT 08/22/2011. CT head and cervical spine 12/30/2008. Left tibia fibula 02/10/2008.  FINDINGS: The following bones were imaged:  Lateral skull: No lytic lesions to suggest myeloma. Normal in appearance.  AP and lateral cervical spine: No lytic lesions to suggest myeloma. Disc space narrowing at C4-5 and C5-6. Facet degenerative changes at C4-5, C5-6 and C6-7. Likely degenerative retrolisthesis of C5 relative to C6 measuring 3 mm and degenerative spondylolisthesis of  C6 on C7 measuring 3 mm. Normal prevertebral soft tissues. Note made of bilateral carotid calcification.  PA chest for ribs: No lytic lesions to suggest myeloma. Linear scar or atelectasis at the left lung base, unchanged since yesterday. Small bilateral pleural effusions, unchanged. No new pulmonary parenchymal abnormalities.  AP views of both shoulders: No lytic lesions to suggest myeloma. Slight narrowing of the subacromial space in the right shoulder and mild degenerative changes in the right acromioclavicular joint. No significant abnormality involving the left shoulder.  AP views of both humeri: No lytic lesions to suggest myeloma. Well preserved bone mineral density for age.  AP views of both forearms: No lytic lesions to suggest myeloma. Symmetric mild radiocarpal joint space narrowing in both wrists. Visualized portions of both elbow joints intact.  AP and lateral thoracic spine: 12 rib-bearing thoracic vertebrae with anatomic alignment. Exaggeration of the usual thoracic kyphosis. No visible lytic lesions to suggest myeloma. Intact pedicles. Disc space narrowing and endplate hypertrophic changes at multiple mid and lower thoracic levels.  AP and lateral lumbar spine: Five non rib-bearing lumbar vertebrae. Lumbar scoliosis convex left. Disc space narrowing and endplate hypertrophic changes at every lumbar level. Grade 1 spondylolisthesis of L4 on L5 approximating 8 mm, likely degenerative. Aortoiliac atherosclerosis.  AP pelvis: No lytic lesions to suggest myeloma. Moderate joint space narrowing involving the left hip. Well preserved joint space in the right hip. Sacroiliac joints and symphysis pubis intact with mild degenerative changes.  AP views of both femora: No lytic lesions to suggest myeloma. Bone mineral density well-preserved.  AP views of both tibia fibula: No lytic lesions to suggest myeloma. Mild degenerative changes involving the visualized right knee joint. Visualized left knee joint and both  ankle joints intact.  IMPRESSION: 1. No evidence of multiple myeloma. 2. Degenerative changes as detailed above.   Electronically Signed   By: Evangeline Dakin M.D.   On: 03/18/2015 14:18    CBC  Recent Labs Lab 03/17/15 1000 03/17/15 1620 03/18/15 0405 03/19/15 0413 03/20/15 0530 03/21/15 0545  WBC 7.3  --  5.7 6.5 5.7 7.6  HGB 11.1*  --  10.0* 10.0* 9.9* 11.4*  HCT 33.5* 32.9* 30.0* 30.5* 29.8* 34.7*  PLT 354  --  337 320 314 389  MCV 90.1  --  90.4 90.5 90.6 89.9  MCH 29.8  --  30.1 29.7 30.1 29.5  MCHC 33.1  --  33.3 32.8 33.2 32.9  RDW 14.5  --  14.7 14.6 14.5 14.9  LYMPHSABS 1.3  --   --   --   --   --   MONOABS 0.9  --   --   --   --   --   EOSABS 0.2  --   --   --   --   --   BASOSABS 0.1  --   --   --   --   --     Chemistries   Recent Labs Lab 03/17/15 1000 03/18/15 0405 03/19/15 0413 03/20/15 0530 03/21/15 1115  NA 133* 136 135 132* 133*  K 5.2* 4.4 4.3 4.0 3.9  CL 98 102 100 97 99  CO2 _0 GLUCOSE 90 96 102* 94 153*  BUN 37* 37* 36* 38* 39*  CREATININE 1.18* 1.17* 1.12* 1.23* 1.20*  CALCIUM 8.1* 7.9* 7.7* 7.5* 7.7*  AST _1 --   --   ALT _2 --   --   ALKPHOS 59 52 50  --   --   BILITOT 0.9 0.5 0.4  --   --    ------------------------------------------------------------------------------------------------------------------ estimated creatinine clearance is 34.4 mL/min (by C-G formula based on Cr of 1.2). ------------------------------------------------------------------------------------------------------------------  Recent Labs  03/20/15 1103  HGBA1C 6.2*   ------------------------------------------------------------------------------------------------------------------  Recent Labs  03/20/15 1103  CHOL 269*  HDL 46  LDLCALC 177*  TRIG 231*  CHOLHDL 5.8   ------------------------------------------------------------------------------------------------------------------ No results for input(s): TSH, T4TOTAL,  T3FREE, THYROIDAB in the last 72 hours.  Invalid input(s): FREET3 ------------------------------------------------------------------------------------------------------------------ No results for input(s): VITAMINB12, FOLATE, FERRITIN, TIBC, IRON, RETICCTPCT in the last 72 hours.  Coagulation profile  Recent Labs Lab 03/18/15 0405  INR 1.00    No results for input(s): DDIMER in the last 72 hours.  Cardiac Enzymes No results for input(s): CKMB, TROPONINI, MYOGLOBIN in the last 168 hours.  Invalid input(s): CK ------------------------------------------------------------------------------------------------------------------ Invalid input(s): POCBNP  No results for input(s): GLUCAP in the last 72 hours.   RAI,RIPUDEEP M.D. [facility] 03/21/2015, 1:07 PM  Pager: [phone]   Between 7am to 7pm - call Pager - [phone]  After 7pm go to [email or web address] - password [email or web address]  Call night coverage person covering after 7pm

## 2015-03-21 NOTE — Plan of Care (Signed)
Problem: Food- and Nutrition-Related Knowledge Deficit (NB-1.1) Goal: Nutrition education Formal process to instruct or train a patient/client in a skill or to impart knowledge to help patients/clients voluntarily manage or modify food choices and eating behavior to maintain or improve health. Outcome: Adequate for Discharge Nutrition Education Note  RD consulted for nutrition education regarding low sodium diet  RD provided "Low Sodium Nutrition Therapy" handout from the Academy of [employer]. Reviewed patient's dietary recall. Provided examples on ways to decrease sodium intake in diet. Discouraged intake of processed foods and use of salt shaker. Encouraged fresh fruits and vegetables as well as whole grain sources of carbohydrates to maximize fiber intake.   RD discussed why it is important for patient to adhere to diet recommendations, and emphasized the role of fluids, foods to avoid, and importance of weighing self daily. Teach back method used.  Expect fair to good compliance.  Body mass index is 25.97 kg/(m^2). Pt meets criteria for overweight based on current BMI.  Current diet order is renal with 1200 ml fluid restriction, patient is consuming approximately 100% of meals at this time. Labs and medications reviewed. No further nutrition interventions warranted at this time. RD contact information provided. If additional nutrition issues arise, please re-consult RD.   Erika [name] A. Jimmye Norman, RD, LDN, CDE Pager: [phone] After hours Pager: [phone]

## 2015-03-21 NOTE — Progress Notes (Signed)
Admit: 03/17/2015 LOS: 4  10F with nephrotic syndrome, UP/C = 11   Subjective:  Renal Bx yesterday, no complications No documented UOP; weight unchanged In good spirits.  No pain, hematuria     Filed Weights   03/19/15 0500 03/20/15 0431 03/21/15 0501  Weight: 70.6 kg (155 lb 10.3 oz) 71.2 kg (156 lb 15.5 oz) 70.8 kg (156 lb 1.4 oz)    Scheduled Meds: . enoxaparin (LOVENOX) injection  40 mg Subcutaneous Q24H  . ezetimibe  10 mg Oral Daily  . furosemide  80 mg Intravenous TID  . levothyroxine  75 mcg Oral QAC breakfast  . sodium chloride  3 mL Intravenous Q12H   Continuous Infusions:  PRN Meds:.acetaminophen **OR** acetaminophen, ondansetron **OR** ondansetron (ZOFRAN) IV, sodium chloride  Current Labs: reviewed    Physical Exam:  Blood pressure 143/70, pulse 68, temperature 98.1 F (36.7 C), temperature source Oral, resp. rate 18, height 5\' 5"  (1.651 m), weight 70.8 kg (156 lb 1.4 oz), SpO2 95 %. NAD, lying in bed 3-4+ pitting edema to knees; compression hose on CTAB RRR, no mgr No rashes/lesions Nonfocal  A/P 1. Nephrotic Syndrome with Hypervolemia 1. Etiology 1. 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 C3 & C4 WNL, A1c 6.2%, HIV neg, HCV neg, GBM Ab neg, ANCA PR3 and MPO neg; sFLC with PMD negative.  3. Renal Bx 03/20/15 4. Will need ARB once GFR stable; ? Angioedema vs anasarca while on ACEi 5. I am unconvinced that we have had an effective diuresis so far 6. Await labs this AM 7. Dietary consult for low Na diet 2. Volume management with Hypoalbuminemia 1. lasix to 80 IV TID -- unclear what effect 2. Low Na diet Daily weights, Daily Renal Panel, Strict I/Os, Avoid nephrotoxins (NSAIDs, judicious IV Contrast) Might need to add thiazide 1. HTN: follow with volume management 2. Anemia: stable; monitor; w/u as above 3. Hypoalbuminemia:   Pearson Grippe MD 03/21/2015, 9:22 AM   Recent Labs Lab 03/18/15 0405 03/19/15 0413 03/20/15 0530  NA  136 135 132*  K 4.4 4.3 4.0  CL 102 100 97  CO2 28 28 27   GLUCOSE 96 102* 94  BUN 37* 36* 38*  CREATININE 1.17* 1.12* 1.23*  CALCIUM 7.9* 7.7* 7.5*  PHOS  --   --  4.5    Recent Labs Lab 03/17/15 1000  03/19/15 0413 03/20/15 0530 03/21/15 0545  WBC 7.3  < > 6.5 5.7 7.6  NEUTROABS 4.8  --   --   --   --   HGB 11.1*  < > 10.0* 9.9* 11.4*  HCT 33.5*  < > 30.5* 29.8* 34.7*  MCV 90.1  < > 90.5 90.6 89.9  PLT 354  < > 320 314 389  < > = values in this interval not displayed.

## 2015-03-21 NOTE — Progress Notes (Signed)
Orthopedic Tech Progress Note Patient Details:  MODEAN MCCULLUM 1928-11-04 [id number]  Ortho Devices Type of Ortho Device: Louretta Parma boot Ortho Device/Splint Location: bilateral Ortho Device/Splint Interventions: Application   Hildred Priest 03/21/2015, 4:06 PM

## 2015-03-21 NOTE — Progress Notes (Signed)
Medicare Important Message given? YES  (If response is "NO", the following Medicare IM given date fields will be blank)  Date Medicare IM given: 03/21/15 Medicare IM given by:  Dahlia Client [employer]

## 2015-03-22 LAB — RENAL FUNCTION PANEL
Albumin: <1 g/dL — ABNORMAL LOW (ref 3.5–5.2)
Anion gap: 8 (ref 5–15)
BUN: 41 mg/dL — ABNORMAL HIGH (ref 6–23)
CHLORIDE: 99 mmol/L (ref 96–112)
CO2: 27 mmol/L (ref 19–32)
Calcium: 7.5 mg/dL — ABNORMAL LOW (ref 8.4–10.5)
Creatinine, Ser: 1.07 mg/dL (ref 0.50–1.10)
GFR, EST AFRICAN AMERICAN: 54 mL/min — AB (ref >90)
GFR, EST NON AFRICAN AMERICAN: 46 mL/min — AB (ref >90)
Glucose, Bld: 102 mg/dL — ABNORMAL HIGH (ref 70–99)
Phosphorus: 3.9 mg/dL (ref 2.3–4.6)
Potassium: 3.7 mmol/L (ref 3.5–5.1)
Sodium: 134 mmol/L — ABNORMAL LOW (ref 135–145)

## 2015-03-22 LAB — HEPATITIS B E ANTIGEN: HEP B E AG: NONREACTIVE

## 2015-03-22 MED ORDER — FUROSEMIDE 80 MG PO TABS: 160.0000 mg | ORAL_TABLET | Freq: Three times a day (TID) | ORAL | Status: DC

## 2015-03-22 MED ORDER — LOSARTAN POTASSIUM 25 MG PO TABS: 12.5000 mg | ORAL_TABLET | Freq: Every day | ORAL | Status: DC

## 2015-03-22 MED ORDER — TORSEMIDE 100 MG PO TABS
100.0000 mg | ORAL_TABLET | Freq: Every morning | ORAL | Status: DC
Start: 2015-03-22 — End: 2015-07-20

## 2015-03-22 MED ORDER — TORSEMIDE 100 MG PO TABS
100.0000 mg | ORAL_TABLET | Freq: Every morning | ORAL | Status: DC
  Administered 2015-03-22: 100 mg via ORAL
  Filled 2015-03-22: qty 1

## 2015-03-22 NOTE — Progress Notes (Signed)
Pt discharge education and instructions completed with pt at bedside and pt voices understanding denies any questions. Pt IV and telemetry removed; pt to pickup electronic prescriptions from preferred pharmacy on file. Pt discharge home with husband to transport her home. Pt transported off unit via wheelchair with belongings to the side. Francis Gaines Buckley Bradly RN.

## 2015-03-22 NOTE — Discharge Summary (Addendum)
Physician Discharge Summary  Erika [name] MRN: [id number] DOB/AGE: [birth year] 79 y.o.  PCP: Precious Reel, MD   Admit date: 03/17/2015 Discharge date: 03/22/2015  Discharge Diagnoses:     Principal Problem:   Anasarca Active Problems:   Hypothyroidism-labs 01/2015.   Hypoproteinemia ? etiology   Hyponatremia   Hyperkalemia    Follow-up recommendations Follow-up with nephrology on 03/24/15 Follow-up CBC, CMP in 3-5 days Follow-up with PCP in 3-5 days, TSH, free T4, may need readjustment of her Synthroid     Medication List    STOP taking these medications        furosemide 40 MG tablet  Commonly known as:  LASIX     lisinopril 40 MG tablet  Commonly known as:  PRINIVIL,ZESTRIL      TAKE these medications        acetaminophen 325 MG tablet  Commonly known as:  TYLENOL  Take 650 mg by mouth every 6 (six) hours as needed for mild pain or moderate pain.     atorvastatin 20 MG tablet  Commonly known as:  LIPITOR  Take 20 mg by mouth daily.     denosumab 60 MG/ML Soln injection  Commonly known as:  PROLIA  Inject 60 mg into the skin every 6 (six) months. Administer in upper arm, thigh, or abdomen *pt had this administered last 02/20/15     ezetimibe 10 MG tablet  Commonly known as:  ZETIA  Take 1 tablet (10 mg total) by mouth daily.     levothyroxine 75 MCG tablet  Commonly known as:  SYNTHROID, LEVOTHROID  Take 75 mcg by mouth daily before breakfast.     losartan 25 MG tablet  Commonly known as:  COZAAR  Take 0.5 tablets (12.5 mg total) by mouth at bedtime.     torsemide 100 MG tablet  Commonly known as:  DEMADEX  Take 1 tablet (100 mg total) by mouth every morning.        Discharge Condition: Stable Disposition: 01-Home or Self Care   Consults: * Nephrology    Significant Diagnostic Studies: Dg Chest 2 View  03/17/2015   CLINICAL DATA:  Lower extremity edema and nephrotic syndrome.  EXAM: CHEST - 2 VIEW  COMPARISON:  03/07/2015   FINDINGS: The heart size and mediastinal contours are within normal limits. Left basilar atelectasis and scarring remain. There are small bilateral pleural effusions without overt edema. No focal nodules identified. The spine demonstrates osteopenia and degenerative changes of the thoracic spine.  IMPRESSION: Left basilar atelectasis and scarring. Bilateral small pleural effusions without overt pulmonary edema.   Electronically Signed   By: Aletta Edouard M.D.   On: 03/17/2015 10:43   US Biopsy  03/20/2015   CLINICAL DATA:  Nephrotic syndrome and need for renal biopsy.  EXAM: ULTRASOUND GUIDED CORE BIOPSY OF LEFT KIDNEY  MEDICATIONS: 0.5 mg IV Versed; 25 mcg IV Fentanyl  Total Moderate Sedation Time: 9 minutes.  PROCEDURE: The procedure, risks, benefits, and alternatives were explained to the patient. Questions regarding the procedure were encouraged and answered. The patient understands and consents to the procedure.  A time-out was performed prior to the procedure. The left flank region was prepped with Betadine in a sterile fashion, and a sterile drape was applied covering the operative field. A sterile gown and sterile gloves were used for the procedure. Local anesthesia was provided with 1% Lidocaine.  After localizing the left kidney, a 16 gauge core biopsy device was advanced to the level of  the lower pole cortex. Two core biopsy samples were obtained and submitted in saline for pathologic analysis. Post biopsy imaging was performed by ultrasound.  COMPLICATIONS: None.  FINDINGS: The left kidney was well visualized and easier to see than the right kidney by ultrasound. Intact core biopsy samples were obtained of lower pole cortex. There were no immediate bleeding complications evident by ultrasound.  IMPRESSION: Ultrasound-guided core biopsy performed of the left kidney.   Electronically Signed   By: Aletta Edouard M.D.   On: 03/20/2015 15:45   Dg Chest Portable 1 View  03/07/2015   CLINICAL DATA:   Bilateral lower extremity edema; shortness of breath  EXAM: PORTABLE CHEST - 1 VIEW  COMPARISON:  February 06, 2015  FINDINGS: There is scarring in the left base region. There is no edema or consolidation. Heart size and pulmonary vascularity are normal. No adenopathy. No bone lesions.  IMPRESSION: Scarring left base.  No edema or consolidation.   Electronically Signed   By: Lowella Grip III M.D.   On: 03/07/2015 11:15   Dg Bone Survey Met  03/18/2015   CLINICAL DATA:  81 year old with proteinuria and hypoalbuminemia. Evaluate for multiple myeloma as the cause for such.  EXAM: METASTATIC BONE SURVEY  COMPARISON:  Two-view chest x-ray yesterday and earlier. Maxillofacial CT 08/22/2011. CT head and cervical spine 12/30/2008. Left tibia fibula 02/10/2008.  FINDINGS: The following bones were imaged:  Lateral skull: No lytic lesions to suggest myeloma. Normal in appearance.  AP and lateral cervical spine: No lytic lesions to suggest myeloma. Disc space narrowing at C4-5 and C5-6. Facet degenerative changes at C4-5, C5-6 and C6-7. Likely degenerative retrolisthesis of C5 relative to C6 measuring 3 mm and degenerative spondylolisthesis of C6 on C7 measuring 3 mm. Normal prevertebral soft tissues. Note made of bilateral carotid calcification.  PA chest for ribs: No lytic lesions to suggest myeloma. Linear scar or atelectasis at the left lung base, unchanged since yesterday. Small bilateral pleural effusions, unchanged. No new pulmonary parenchymal abnormalities.  AP views of both shoulders: No lytic lesions to suggest myeloma. Slight narrowing of the subacromial space in the right shoulder and mild degenerative changes in the right acromioclavicular joint. No significant abnormality involving the left shoulder.  AP views of both humeri: No lytic lesions to suggest myeloma. Well preserved bone mineral density for age.  AP views of both forearms: No lytic lesions to suggest myeloma. Symmetric mild radiocarpal joint space  narrowing in both wrists. Visualized portions of both elbow joints intact.  AP and lateral thoracic spine: 12 rib-bearing thoracic vertebrae with anatomic alignment. Exaggeration of the usual thoracic kyphosis. No visible lytic lesions to suggest myeloma. Intact pedicles. Disc space narrowing and endplate hypertrophic changes at multiple mid and lower thoracic levels.  AP and lateral lumbar spine: Five non rib-bearing lumbar vertebrae. Lumbar scoliosis convex left. Disc space narrowing and endplate hypertrophic changes at every lumbar level. Grade 1 spondylolisthesis of L4 on L5 approximating 8 mm, likely degenerative. Aortoiliac atherosclerosis.  AP pelvis: No lytic lesions to suggest myeloma. Moderate joint space narrowing involving the left hip. Well preserved joint space in the right hip. Sacroiliac joints and symphysis pubis intact with mild degenerative changes.  AP views of both femora: No lytic lesions to suggest myeloma. Bone mineral density well-preserved.  AP views of both tibia fibula: No lytic lesions to suggest myeloma. Mild degenerative changes involving the visualized right knee joint. Visualized left knee joint and both ankle joints intact.  IMPRESSION: 1. No evidence of  multiple myeloma. 2. Degenerative changes as detailed above.   Electronically Signed   By: Evangeline Dakin M.D.   On: 03/18/2015 14:18      Microbiology: No results found for this or any previous visit (from the past 240 hour(s)).   Labs: Results for orders placed or performed during the hospital encounter of 03/17/15 (from the past 48 hour(s))  CBC     Status: Abnormal   Collection Time: 03/21/15  5:45 AM  Result Value Ref Range   WBC 7.6 4.0 - 10.5 K/uL   RBC 3.86 (L) 3.87 - 5.11 MIL/uL   Hemoglobin 11.4 (L) 12.0 - 15.0 g/dL   HCT 34.7 (L) 36.0 - 46.0 %   MCV 89.9 78.0 - 100.0 fL   MCH 29.5 26.0 - 34.0 pg   MCHC 32.9 30.0 - 36.0 g/dL   RDW 14.9 11.5 - 15.5 %   Platelets 389 150 - 400 K/uL  Renal function  panel     Status: Abnormal   Collection Time: 03/21/15 11:15 AM  Result Value Ref Range   Sodium 133 (L) 135 - 145 mmol/L   Potassium 3.9 3.5 - 5.1 mmol/L   Chloride 99 96 - 112 mmol/L   CO2 24 19 - 32 mmol/L   Glucose, Bld 153 (H) 70 - 99 mg/dL   BUN 39 (H) 6 - 23 mg/dL   Creatinine, Ser 1.20 (H) 0.50 - 1.10 mg/dL   Calcium 7.7 (L) 8.4 - 10.5 mg/dL   Phosphorus 4.0 2.3 - 4.6 mg/dL   Albumin 1.1 (L) 3.5 - 5.2 g/dL   GFR calc non Af Amer 40 (L) >90 mL/min   GFR calc Af Amer 47 (L) >90 mL/min    Comment: (NOTE) The eGFR has been calculated using the CKD EPI equation. This calculation has not been validated in all clinical situations. eGFR's persistently <90 mL/min signify possible Chronic Kidney Disease.    Anion gap 10 5 - 15  Renal function panel     Status: Abnormal   Collection Time: 03/22/15  5:00 AM  Result Value Ref Range   Sodium 134 (L) 135 - 145 mmol/L   Potassium 3.7 3.5 - 5.1 mmol/L   Chloride 99 96 - 112 mmol/L   CO2 27 19 - 32 mmol/L   Glucose, Bld 102 (H) 70 - 99 mg/dL   BUN 41 (H) 6 - 23 mg/dL   Creatinine, Ser 1.07 0.50 - 1.10 mg/dL   Calcium 7.5 (L) 8.4 - 10.5 mg/dL   Phosphorus 3.9 2.3 - 4.6 mg/dL   Albumin <1.0 (L) 3.5 - 5.2 g/dL   GFR calc non Af Amer 46 (L) >90 mL/min   GFR calc Af Amer 54 (L) >90 mL/min    Comment: (NOTE) The eGFR has been calculated using the CKD EPI equation. This calculation has not been validated in all clinical situations. eGFR's persistently <90 mL/min signify possible Chronic Kidney Disease.    Anion gap 8 5 - 15    Erika [name] is a 81 y.o. female with PMH significant for HTN, HLD, seeing by PCP for anasarca, hypoalbuminemia, has received lasix without significant improvement of volume status, she was evaluated by her cardiology for HF, and also had doppler LE negative for DVT. Her PCP has been doing a work up for anasarca and hypoalbuminemia. She continued to accumulate Fluids, for this reason she presented to the ED. It  has been difficult to work, due to swelling. She denied chest pain , dyspnea, diarrhea, or  abdominal pain. Patient also reported being refractory to oral Lasix and had developed eye and lip spelling of the day of admission.She has also had initial workup for nephrotic syndrome started by her PCP, Dr. Shon Baton who noted significant proteinuria on Urinalysis and 24 hour urine protein of 2.5grams, low albumin, normal complements and SPEP.   Assessment & Plan    Principal Problem:  Anasarca with hypoalbuminemia, proteinuria,:?Suspect Membranous GN, but only renal Bx will clarify 1. C3 & C4 WNL, A1c 6.2%, HIV neg, HCV neg, GBM Ab neg, ANCA PR3 and MPO neg; sFLC with PMD negative.  2. Renal Bx 03/20/15; Hb stable post Bx 3. Started low dose ARB losartan 12.76m qhs 4/26 4. Seemed to respond to 80 IV TID lasix, change to 1055mPO toresmide qAm 5. Might need to add thiazide 6. Dietary consult completed for low Na diet OK for dc, will arrange close f/u and labs at our office 03/24/15-  Bone survey negative for multiple myeloma or any lytic lesions-negative, no evidence of multiple myeloma    Hypothyroidism-labs 01/2015. - Patient reported that she was started on Synthroid 4 weeks ago, currently on 25 MCG daily, and was supposed to start on 75 MCG on 4/23 - will continue Synthroid at 75 g  Anemia: Mixed normocytic and iron deficiency anemia - SPEP, UPEP pending, - Skeletal survey negative for multiple myeloma negative,   - May need bone marrow biopsy CBC in one week   Hyponatremia, Hyperkalemia CBC, CMP in 1 week   Code Status: full code          Discharge Exam:    Blood pressure 142/69, pulse 82, temperature 98.5 F (36.9 C), temperature source Oral, resp. rate 18, height 5' 5" (1.651 m), weight 70.988 kg (156 lb 8 oz), SpO2 97 %.   2. General: Alert and oriented x 3, NAD 3. HEENT: PERRLA, EOMI, Anicteic Sclera 4. Neck: Supple, no JVD 5. CVS: S1 S2 clear,  RRR 6. Respiratory: Clear to auscultation bilaterally 7. Abdomen: Soft, nontender, nondistended, + bowel sounds 8. Ext: no cyanosis clubbing,b/l legs wrapped 9. Neuro: AAOx3, Cr N's II- XII. Strength 5/5 upper and lower extremities bilaterally 10. Skin: No rashes 11. Psych: Normal affect and demeanor, alert and oriented x3        Follow-up Information    Follow up with RUPrecious ReelMD. Schedule an appointment as soon as possible for a visit in 3 days.   Specialty:  Internal Medicine   Contact information:   [address] [phone]     Follow up with COLADONATO,JOSEPH A, MD. Schedule an appointment as soon as possible for a visit on 03/24/2015.   Specialty:  Nephrology   Contact information:   [address] [phone]     Signed: ABReyne Dumas/27/2016, 11:22 AM

## 2015-03-22 NOTE — Progress Notes (Signed)
Admit: 03/17/2015 LOS: 5  35F with nephrotic syndrome, UP/C = 11   Subjective:  2L UOP yesterday Started low dose ARB in evening  04/26 0701 - 04/27 0700 In: 720 [P.O.:720] Out: 1927 [Urine:1927]  Filed Weights   03/20/15 0431 03/21/15 0501 03/22/15 6203  Weight: 71.2 kg (156 lb 15.5 oz) 70.8 kg (156 lb 1.4 oz) 70.988 kg (156 lb 8 oz)    Scheduled Meds: . enoxaparin (LOVENOX) injection  40 mg Subcutaneous Q24H  . ezetimibe  10 mg Oral Daily  . furosemide  80 mg Intravenous TID  . levothyroxine  75 mcg Oral QAC breakfast  . losartan  12.5 mg Oral QHS  . sodium chloride  3 mL Intravenous Q12H   Continuous Infusions:  PRN Meds:.acetaminophen **OR** acetaminophen, ondansetron **OR** ondansetron (ZOFRAN) IV, sodium chloride  Current Labs: reviewed    Physical Exam:  Blood pressure 142/69, pulse 82, temperature 98.5 F (36.9 C), temperature source Oral, resp. rate 18, height 5\' 5"  (1.651 m), weight 70.988 kg (156 lb 8 oz), SpO2 97 %. NAD, lying in bed 3-4+ pitting edema to knees; compression hose on CTAB RRR, no mgr No rashes/lesions Nonfocal  A/P 1. Nephrotic Syndrome with Hypervolemia 1. Etiology 1. 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 C3 & C4 WNL, A1c 6.2%, HIV neg, HCV neg, GBM Ab neg, ANCA PR3 and MPO neg; sFLC with PMD negative.  3. Renal Bx 03/20/15; Hb stable post Bx 4. Started low dose ARB losartan 12.5mg  qhs 4/26 5. Seemed to respond to 80 IV TID lasix, change to 100mg  PO toresmide qAm 6. Might need to add thiazide 7. Dietary consult completed for low Na diet 8. OK for dc, will arrange close f/u and labs at our office 03/24/15 2. Volume management with Hypoalbuminemia 1. As above 2. Low Na diet Daily weights, Daily Renal Panel, Strict I/Os, Avoid nephrotoxins (NSAIDs, judicious IV Contrast) 1. HTN: follow with volume management 2. Anemia: stable; monitor; w/u as above 3. Hypoalbuminemia:   Pearson Grippe MD 03/22/2015, 9:29 AM   Recent  Labs Lab 03/20/15 0530 03/21/15 1115 03/22/15 0500  NA 132* 133* 134*  K 4.0 3.9 3.7  CL 97 99 99  CO2 27 24 27   GLUCOSE 94 153* 102*  BUN 38* 39* 41*  CREATININE 1.23* 1.20* 1.07  CALCIUM 7.5* 7.7* 7.5*  PHOS 4.5 4.0 3.9    Recent Labs Lab 03/17/15 1000  03/19/15 0413 03/20/15 0530 03/21/15 0545  WBC 7.3  < > 6.5 5.7 7.6  NEUTROABS 4.8  --   --   --   --   HGB 11.1*  < > 10.0* 9.9* 11.4*  HCT 33.5*  < > 30.5* 29.8* 34.7*  MCV 90.1  < > 90.5 90.6 89.9  PLT 354  < > 320 314 389  < > = values in this interval not displayed.

## 2015-03-29 ENCOUNTER — Encounter (HOSPITAL_COMMUNITY): Payer: Self-pay

## 2015-05-20 IMAGING — CR DG BONE SURVEY MET
8 of 10 series · 8 of 10 positions shown · non-contrast
Comparison: Two-view chest x-ray yesterday and earlier.

CLINICAL DATA: 86-year-old with proteinuria and hypoalbuminemia.
Evaluate for multiple myeloma as the cause for such.

EXAM:
METASTATIC BONE SURVEY

[chest pa]
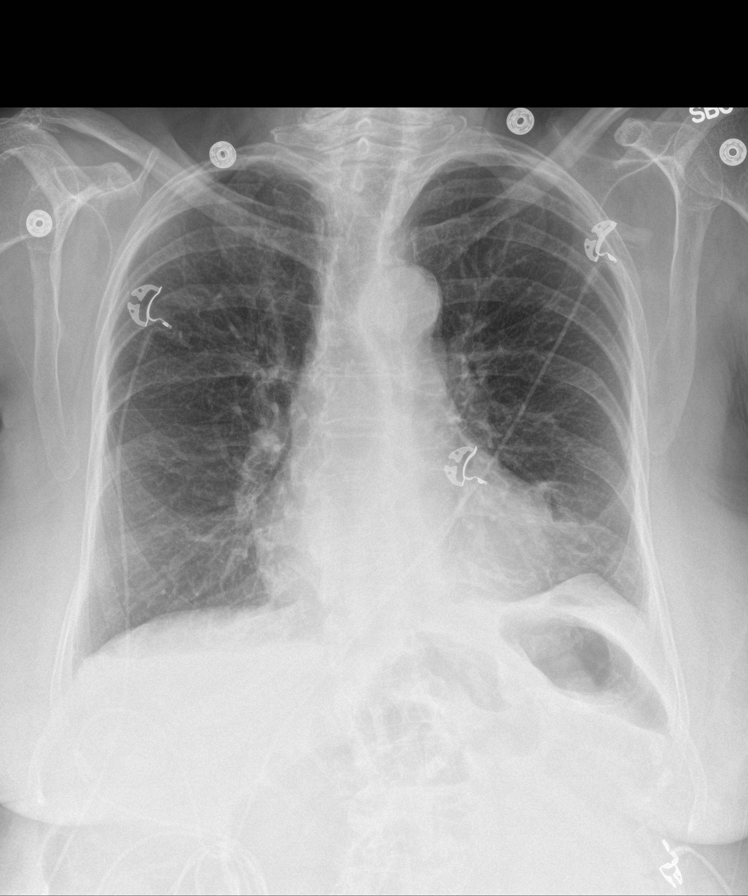

[c-spine lat]
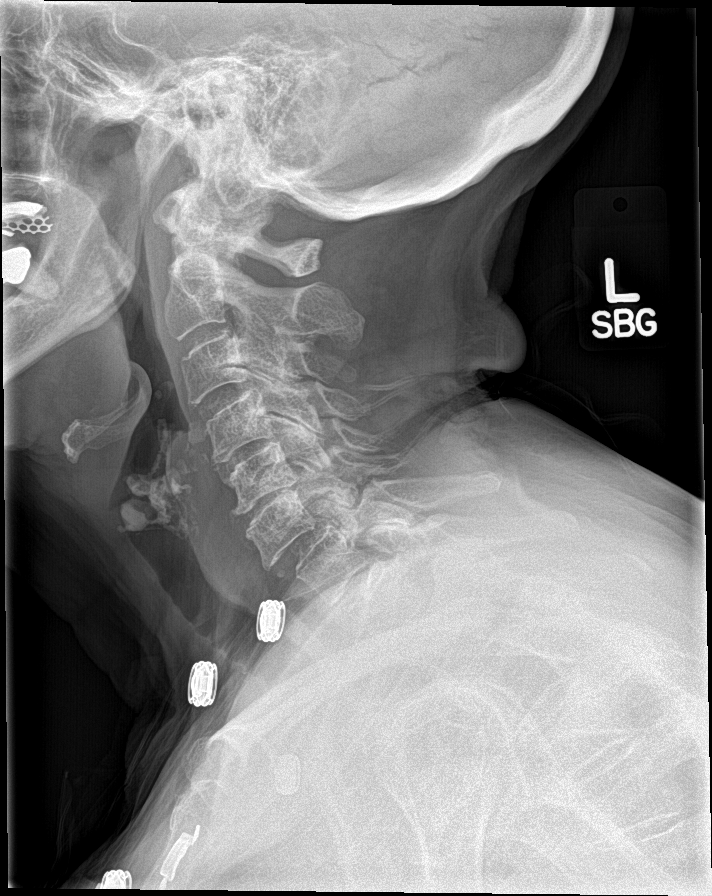

[skull lat]
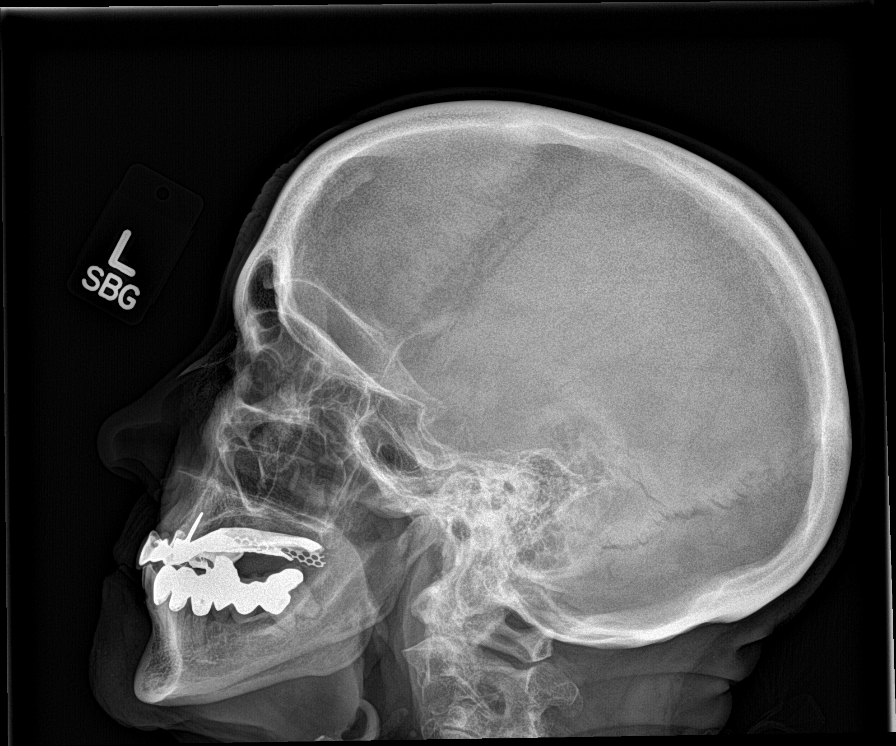

[shoulder ap (1 of 2)]
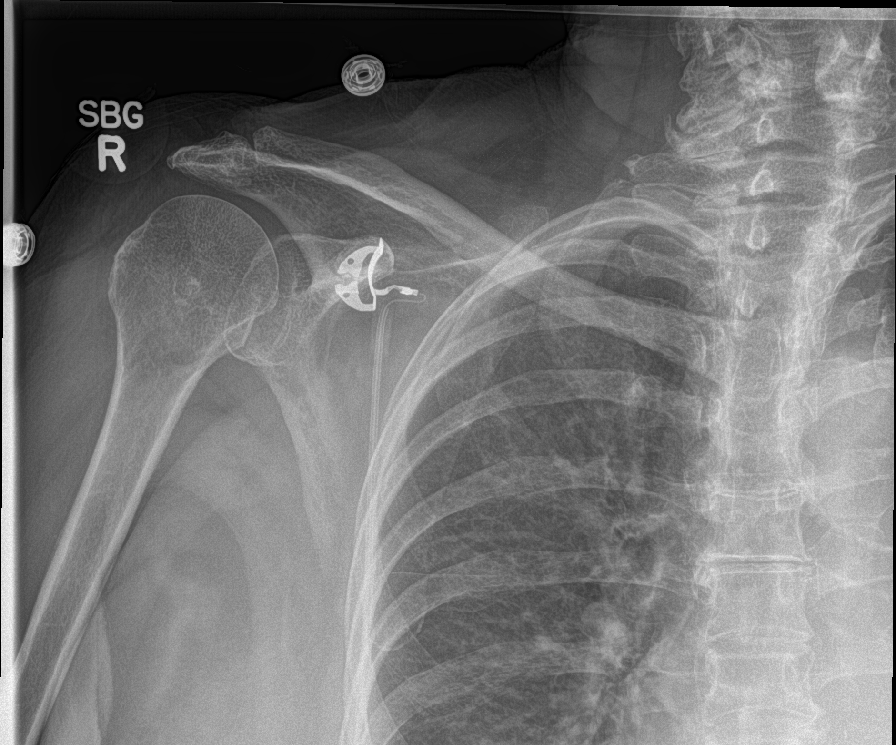

[shoulder ap (2 of 2)]
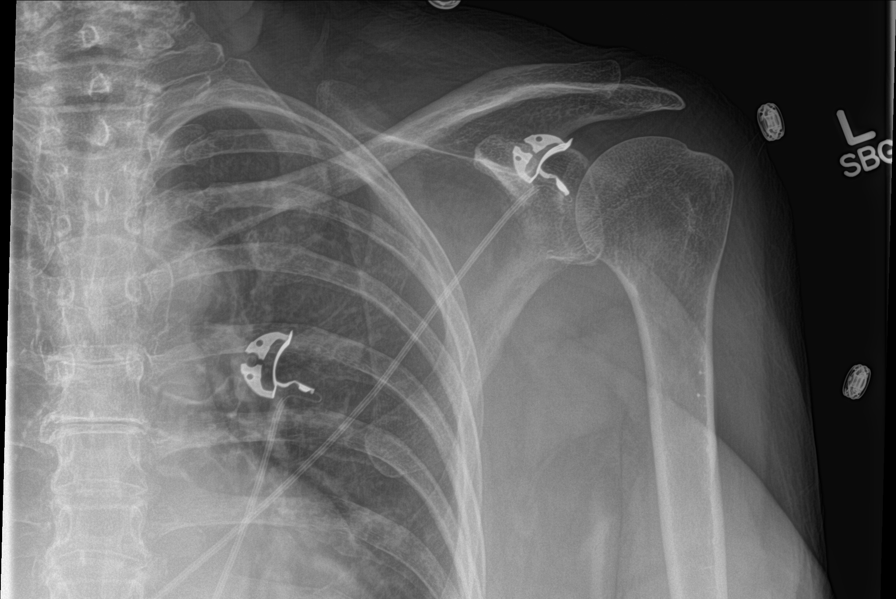

[humerus ap (1 of 2)]
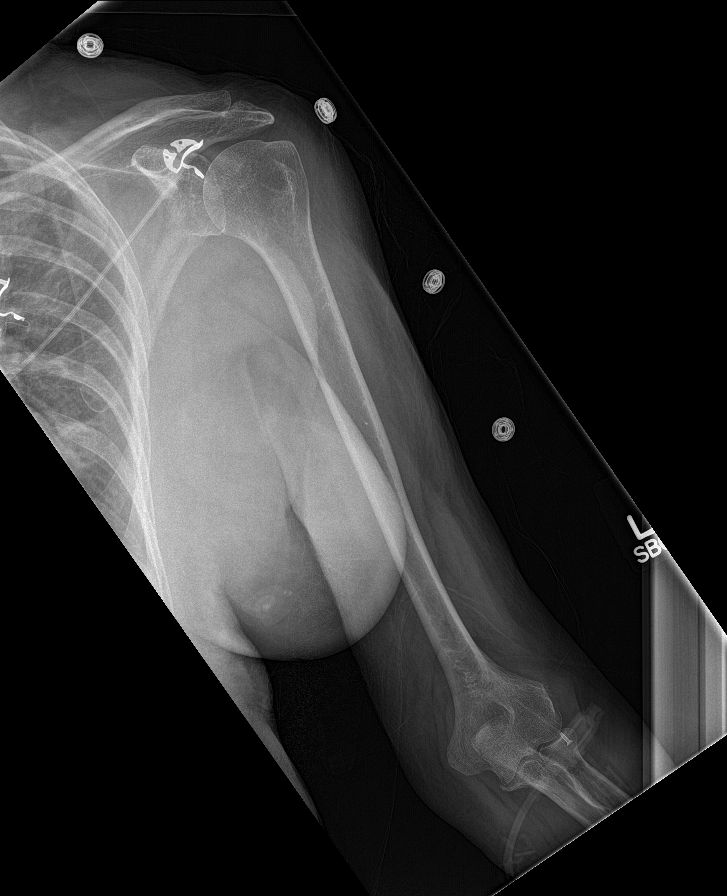

[humerus ap (2 of 2)]
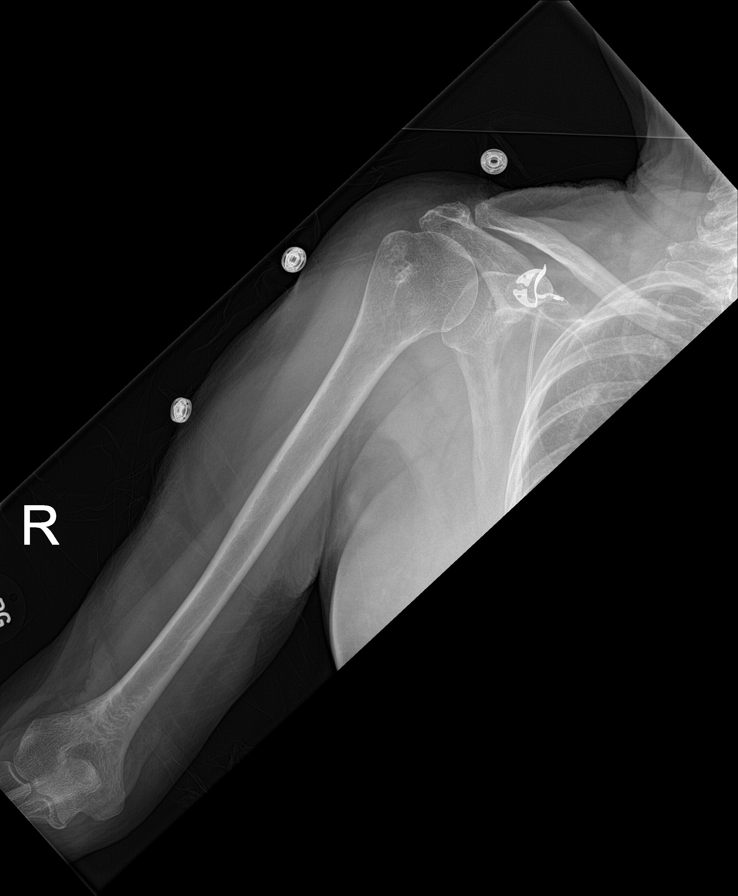

[c-spine ap]
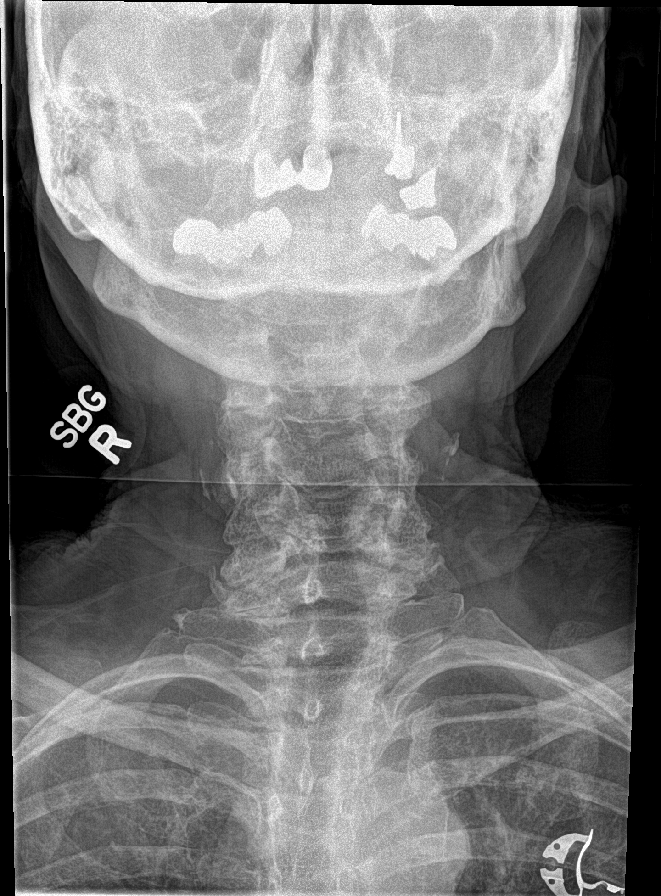

[8 of 10 positions shown; findings below may reference images not displayed]

Maxillofacial CT 08/22/2011. CT head and cervical spine 12/30/2008.
Left tibia fibula 02/10/2008.
FINDINGS: The following bones were imaged:

Lateral skull: No lytic lesions to suggest myeloma. Normal in
appearance.

AP and lateral cervical spine: No lytic lesions to suggest myeloma.
Disc space narrowing at C4-5 and C5-6. Facet degenerative changes at
C4-5, C5-6 and C6-7. Likely degenerative retrolisthesis of C5
relative to C6 measuring 3 mm and degenerative spondylolisthesis of
C6 on C7 measuring 3 mm. Normal prevertebral soft tissues. Note made
of bilateral carotid calcification.

PA chest for ribs: No lytic lesions to suggest myeloma. Linear scar
or atelectasis at the left lung base, unchanged since yesterday.
Small bilateral pleural effusions, unchanged. No new pulmonary
parenchymal abnormalities.

AP views of both shoulders: No lytic lesions to suggest myeloma.
Slight narrowing of the subacromial space in the right shoulder and
mild degenerative changes in the right acromioclavicular joint. No
significant abnormality involving the left shoulder.

AP views of both humeri: No lytic lesions to suggest myeloma. Well
preserved bone mineral density for age.

AP views of both forearms: No lytic lesions to suggest myeloma.
Symmetric mild radiocarpal joint space narrowing in both wrists.
Visualized portions of both elbow joints intact.

AP and lateral thoracic spine: 12 rib-bearing thoracic vertebrae
with anatomic alignment. Exaggeration of the usual thoracic
kyphosis. No visible lytic lesions to suggest myeloma. Intact
pedicles. Disc space narrowing and endplate hypertrophic changes at
multiple mid and lower thoracic levels.

AP and lateral lumbar spine: Five non rib-bearing lumbar vertebrae.
Lumbar scoliosis convex left. Disc space narrowing and endplate
hypertrophic changes at every lumbar level. Grade 1
spondylolisthesis of L4 on L5 approximating 8 mm, likely
degenerative. Aortoiliac atherosclerosis.

AP pelvis: No lytic lesions to suggest myeloma. Moderate joint space
narrowing involving the left hip. Well preserved joint space in the
right hip. Sacroiliac joints and symphysis pubis intact with mild
degenerative changes.

AP views of both femora: No lytic lesions to suggest myeloma. Bone
mineral density well-preserved.

AP views of both tibia fibula: No lytic lesions to suggest myeloma.
Mild degenerative changes involving the visualized right knee joint.
Visualized left knee joint and both ankle joints intact.
IMPRESSION: 1. No evidence of multiple myeloma.
2. Degenerative changes as detailed above.

## 2015-07-17 ENCOUNTER — Emergency Department (HOSPITAL_COMMUNITY): Payer: Medicare Other

## 2015-07-17 ENCOUNTER — Inpatient Hospital Stay (HOSPITAL_COMMUNITY)
Admission: EM | Admit: 2015-07-17 | Discharge: 2015-07-20 | DRG: 684 | Disposition: A | Discharge status: Home / Self Care | Payer: Medicare Other | Attending: Internal Medicine | Admitting: Internal Medicine

## 2015-07-17 ENCOUNTER — Encounter (HOSPITAL_COMMUNITY): Payer: Self-pay | Admitting: *Deleted

## 2015-07-17 DIAGNOSIS — I4510 Unspecified right bundle-branch block: Secondary | ICD-10-CM | POA: Diagnosis present

## 2015-07-17 DIAGNOSIS — I252 Old myocardial infarction: Secondary | ICD-10-CM | POA: Diagnosis not present

## 2015-07-17 DIAGNOSIS — Z79899 Other long term (current) drug therapy: Secondary | ICD-10-CM

## 2015-07-17 DIAGNOSIS — E039 Hypothyroidism, unspecified: Secondary | ICD-10-CM | POA: Diagnosis present

## 2015-07-17 DIAGNOSIS — Z7952 Long term (current) use of systemic steroids: Secondary | ICD-10-CM | POA: Diagnosis not present

## 2015-07-17 DIAGNOSIS — E876 Hypokalemia: Secondary | ICD-10-CM | POA: Diagnosis present

## 2015-07-17 DIAGNOSIS — E785 Hyperlipidemia, unspecified: Secondary | ICD-10-CM | POA: Diagnosis present

## 2015-07-17 DIAGNOSIS — R55 Syncope and collapse: Secondary | ICD-10-CM | POA: Diagnosis present

## 2015-07-17 DIAGNOSIS — N183 Chronic kidney disease, stage 3 (moderate): Secondary | ICD-10-CM | POA: Diagnosis present

## 2015-07-17 DIAGNOSIS — D72819 Decreased white blood cell count, unspecified: Secondary | ICD-10-CM | POA: Diagnosis present

## 2015-07-17 DIAGNOSIS — Z8249 Family history of ischemic heart disease and other diseases of the circulatory system: Secondary | ICD-10-CM | POA: Diagnosis not present

## 2015-07-17 DIAGNOSIS — D631 Anemia in chronic kidney disease: Secondary | ICD-10-CM | POA: Diagnosis present

## 2015-07-17 DIAGNOSIS — I129 Hypertensive chronic kidney disease with stage 1 through stage 4 chronic kidney disease, or unspecified chronic kidney disease: Secondary | ICD-10-CM | POA: Diagnosis present

## 2015-07-17 DIAGNOSIS — N042 Nephrotic syndrome with diffuse membranous glomerulonephritis: Secondary | ICD-10-CM | POA: Diagnosis present

## 2015-07-17 DIAGNOSIS — E860 Dehydration: Secondary | ICD-10-CM | POA: Diagnosis present

## 2015-07-17 DIAGNOSIS — Z66 Do not resuscitate: Secondary | ICD-10-CM | POA: Diagnosis present

## 2015-07-17 DIAGNOSIS — N179 Acute kidney failure, unspecified: Secondary | ICD-10-CM | POA: Diagnosis present

## 2015-07-17 DIAGNOSIS — M8580 Other specified disorders of bone density and structure, unspecified site: Secondary | ICD-10-CM | POA: Diagnosis present

## 2015-07-17 DIAGNOSIS — I2510 Atherosclerotic heart disease of native coronary artery without angina pectoris: Secondary | ICD-10-CM | POA: Diagnosis present

## 2015-07-17 LAB — URINALYSIS W MICROSCOPIC (NOT AT ARMC)
Bilirubin Urine: NEGATIVE
Glucose, UA: 250 mg/dL — AB
Ketones, ur: NEGATIVE mg/dL
Leukocytes, UA: NEGATIVE
NITRITE: NEGATIVE
Specific Gravity, Urine: 1.011 (ref 1.005–1.030)
Urobilinogen, UA: 0.2 mg/dL (ref 0.0–1.0)
pH: 6 (ref 5.0–8.0)

## 2015-07-17 LAB — CREATININE, SERUM
Creatinine, Ser: 1.63 mg/dL — ABNORMAL HIGH (ref 0.44–1.00)
GFR calc Af Amer: 32 mL/min — ABNORMAL LOW (ref >60)
GFR calc non Af Amer: 28 mL/min — ABNORMAL LOW (ref >60)

## 2015-07-17 LAB — CBC
HEMATOCRIT: 28.9 % — AB (ref 36.0–46.0)
HEMOGLOBIN: 9.7 g/dL — AB (ref 12.0–15.0)
MCH: 30.7 pg (ref 26.0–34.0)
MCHC: 33.6 g/dL (ref 30.0–36.0)
MCV: 91.5 fL (ref 78.0–100.0)
Platelets: 204 10*3/uL (ref 150–400)
RBC: 3.16 MIL/uL — ABNORMAL LOW (ref 3.87–5.11)
RDW: 14.8 % (ref 11.5–15.5)
WBC: 4.5 10*3/uL (ref 4.0–10.5)

## 2015-07-17 LAB — CBC WITH DIFFERENTIAL/PLATELET
BASOS ABS: 0 10*3/uL (ref 0.0–0.1)
Basophils Relative: 1 % (ref 0–1)
Eosinophils Absolute: 0 10*3/uL (ref 0.0–0.7)
Eosinophils Relative: 0 % (ref 0–5)
HEMATOCRIT: 28.9 % — AB (ref 36.0–46.0)
Hemoglobin: 9.7 g/dL — ABNORMAL LOW (ref 12.0–15.0)
LYMPHS PCT: 6 % — AB (ref 12–46)
Lymphs Abs: 0.3 10*3/uL — ABNORMAL LOW (ref 0.7–4.0)
MCH: 31.2 pg (ref 26.0–34.0)
MCHC: 33.6 g/dL (ref 30.0–36.0)
MCV: 92.9 fL (ref 78.0–100.0)
Monocytes Absolute: 0.4 10*3/uL (ref 0.1–1.0)
Monocytes Relative: 6 % (ref 3–12)
Neutro Abs: 5.2 10*3/uL (ref 1.7–7.7)
Neutrophils Relative %: 87 % — ABNORMAL HIGH (ref 43–77)
Platelets: 200 10*3/uL (ref 150–400)
RBC: 3.11 MIL/uL — AB (ref 3.87–5.11)
RDW: 15.1 % (ref 11.5–15.5)
WBC: 6 10*3/uL (ref 4.0–10.5)

## 2015-07-17 LAB — TSH: TSH: 2.522 u[IU]/mL (ref 0.350–4.500)

## 2015-07-17 LAB — COMPREHENSIVE METABOLIC PANEL
ALK PHOS: 62 U/L (ref 38–126)
ALT: 15 U/L (ref 14–54)
AST: 25 U/L (ref 15–41)
Albumin: <1 g/dL — ABNORMAL LOW (ref 3.5–5.0)
Anion gap: 11 (ref 5–15)
BILIRUBIN TOTAL: 0.4 mg/dL (ref 0.3–1.2)
BUN: 45 mg/dL — AB (ref 6–20)
CALCIUM: 7.8 mg/dL — AB (ref 8.9–10.3)
CO2: 27 mmol/L (ref 22–32)
CREATININE: 1.63 mg/dL — AB (ref 0.44–1.00)
Chloride: 99 mmol/L — ABNORMAL LOW (ref 101–111)
GFR calc non Af Amer: 28 mL/min — ABNORMAL LOW (ref >60)
GFR, EST AFRICAN AMERICAN: 32 mL/min — AB (ref >60)
Glucose, Bld: 152 mg/dL — ABNORMAL HIGH (ref 65–99)
Potassium: 2.9 mmol/L — ABNORMAL LOW (ref 3.5–5.1)
Sodium: 137 mmol/L (ref 135–145)
TOTAL PROTEIN: 4.2 g/dL — AB (ref 6.5–8.1)

## 2015-07-17 LAB — MAGNESIUM: MAGNESIUM: 1.6 mg/dL — AB (ref 1.7–2.4)

## 2015-07-17 LAB — PROTIME-INR
INR: 1.03 (ref 0.00–1.49)
Prothrombin Time: 13.7 seconds (ref 11.6–15.2)

## 2015-07-17 LAB — I-STAT TROPONIN, ED: TROPONIN I, POC: 0.1 ng/mL — AB (ref 0.00–0.08)

## 2015-07-17 LAB — APTT: APTT: 28 s (ref 24–37)

## 2015-07-17 LAB — CBG MONITORING, ED: GLUCOSE-CAPILLARY: 134 mg/dL — AB (ref 65–99)

## 2015-07-17 LAB — PHOSPHORUS: PHOSPHORUS: 3.8 mg/dL (ref 2.5–4.6)

## 2015-07-17 LAB — TROPONIN I
TROPONIN I: 0.08 ng/mL — AB (ref <0.031)
Troponin I: 0.08 ng/mL — ABNORMAL HIGH (ref <0.031)

## 2015-07-17 MED ORDER — SODIUM CHLORIDE 0.9 % IV BOLUS (SEPSIS): 500.0000 mL | Freq: Once | INTRAVENOUS | Status: DC

## 2015-07-17 MED ORDER — SODIUM CHLORIDE 0.9 % IJ SOLN
3.0000 mL | Freq: Two times a day (BID) | INTRAMUSCULAR | Status: DC
  Administered 2015-07-17 – 2015-07-19 (×4): 3 mL via INTRAVENOUS

## 2015-07-17 MED ORDER — PREDNISONE 20 MG PO TABS
20.0000 mg | ORAL_TABLET | Freq: Every day | ORAL | Status: DC
  Administered 2015-07-19 – 2015-07-20 (×2): 20 mg via ORAL
  Filled 2015-07-17 (×3): qty 1

## 2015-07-17 MED ORDER — SODIUM CHLORIDE 0.9 % IV SOLN: INTRAVENOUS | Status: DC

## 2015-07-17 MED ORDER — POTASSIUM CHLORIDE CRYS ER 20 MEQ PO TBCR
40.0000 meq | EXTENDED_RELEASE_TABLET | Freq: Four times a day (QID) | ORAL | Status: AC
  Administered 2015-07-17: 40 meq via ORAL
  Filled 2015-07-17: qty 2

## 2015-07-17 MED ORDER — POTASSIUM CHLORIDE 20 MEQ PO PACK
80.0000 meq | PACK | Freq: Two times a day (BID) | ORAL | Status: DC
  Filled 2015-07-17 (×2): qty 4

## 2015-07-17 MED ORDER — ATORVASTATIN CALCIUM 20 MG PO TABS
20.0000 mg | ORAL_TABLET | Freq: Every day | ORAL | Status: DC
  Administered 2015-07-18 – 2015-07-20 (×3): 20 mg via ORAL
  Filled 2015-07-17 (×3): qty 1

## 2015-07-17 MED ORDER — POTASSIUM CHLORIDE 10 MEQ/100ML IV SOLN
10.0000 meq | Freq: Once | INTRAVENOUS | Status: AC
  Administered 2015-07-17: 10 meq via INTRAVENOUS
  Filled 2015-07-17: qty 100

## 2015-07-17 MED ORDER — ACETAMINOPHEN 325 MG PO TABS
650.0000 mg | ORAL_TABLET | Freq: Four times a day (QID) | ORAL | Status: DC | PRN
  Administered 2015-07-18: 650 mg via ORAL
  Filled 2015-07-17: qty 2

## 2015-07-17 MED ORDER — LEVOTHYROXINE SODIUM 88 MCG PO TABS
88.0000 ug | ORAL_TABLET | Freq: Every day | ORAL | Status: DC
  Administered 2015-07-18 – 2015-07-20 (×3): 88 ug via ORAL
  Filled 2015-07-17 (×3): qty 1

## 2015-07-17 MED ORDER — ENOXAPARIN SODIUM 40 MG/0.4ML ~~LOC~~ SOLN
40.0000 mg | SUBCUTANEOUS | Status: DC
  Administered 2015-07-17: 40 mg via SUBCUTANEOUS
  Filled 2015-07-17: qty 0.4

## 2015-07-17 MED ORDER — MAGNESIUM SULFATE 2 GM/50ML IV SOLN
2.0000 g | Freq: Once | INTRAVENOUS | Status: AC
  Administered 2015-07-17: 2 g via INTRAVENOUS
  Filled 2015-07-17: qty 50

## 2015-07-17 MED ORDER — EZETIMIBE 10 MG PO TABS
10.0000 mg | ORAL_TABLET | Freq: Every day | ORAL | Status: DC
  Administered 2015-07-18 – 2015-07-20 (×3): 10 mg via ORAL
  Filled 2015-07-17 (×3): qty 1

## 2015-07-17 NOTE — ED Notes (Signed)
Attempted report 

## 2015-07-17 NOTE — ED Notes (Signed)
Per [facility] pt has not been feeling well X 2 days.  She went to work today and began to feel like she was [place] pass out. She was sitting in a chair when she had her syncopal episode and was witnessed by coworkers.  VS per EMS are as follows: BP: 150/80 HR: 81 Resp: 16 CBG: 209

## 2015-07-17 NOTE — ED Notes (Signed)
Patient given a turkey sandwich.  

## 2015-07-17 NOTE — ED Notes (Signed)
Pt left for CT.

## 2015-07-17 NOTE — ED Provider Notes (Signed)
CSN: [id number]     Arrival date & time 07/17/15  1127 History   First MD Initiated Contact with Patient 07/17/15 1128     Chief Complaint  Patient presents with  . Near Syncope     (Consider location/radiation/quality/duration/timing/severity/associated sxs/prior Treatment) HPI 81 year old female who presents with syncopal episode. History of CAD with prior MI, hyperlipidemia, and hypertension. 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 She subsequently had loss of consciousness, and nearby workers brought her down to the floor. She denies that she was told that she had any seizure-like activity, but she did have episode of urinary incontinence. Denies any tongue biting. Denies any associating chest pain, palpitations, difficulty breathing, nausea or vomiting. She has otherwise been in her usual state of health, but has been feeling a little bit tired over the course of the past 2-3 days. Denies any abdominal pain, melena, hematochezia, dysuria, urinary frequency, diarrhea. Complains of mild headache, but denies vision changes, speech changes, focal weakness or numbness, or difficulty with ambulation.   Past Medical History  Diagnosis Date  . Hypertension   . Hyperlipidemia   . Myocardial infarction 1990  . CAD (coronary artery disease)     post MI in 80  . Osteopenia   . Hypothyroidism   . Fall Sept 2012    with left zygoma fracture  . Normal nuclear stress test July 2012    No ischemia.   . Right bundle branch block    Past Surgical History  Procedure Laterality Date  . Cardiac catheterization  10/18/1994    Single vessel disease with a severe stenosis in the RCA which had recannalized since prior study in 1990. Managed medically  . Cardiac catheterization  09/22/89  . Cataract extraction, bilateral  10/2009   Family History  Problem Relation Age of Onset  . Heart attack Father 66  . Heart attack Mother 68  . Heart disease  Brother   . Heart disease Brother    Social History  Substance Use Topics  . Smoking status: Never Smoker   . Smokeless tobacco: Never Used  . Alcohol Use: No   OB History    No data available     Review of Systems 10/14 systems reviewed and are negative other than those stated in the HPI    Allergies  Review of patient's allergies indicates no known allergies.  Home Medications   Prior to Admission medications   Medication Sig Start Date End Date Taking? Authorizing Provider  acetaminophen (TYLENOL) 325 MG tablet Take 650 mg by mouth every 6 (six) hours as needed for mild pain or moderate pain.   Yes Historical Provider, MD  atorvastatin (LIPITOR) 40 MG tablet Take 20 mg by mouth daily. 07/04/15  Yes Historical Provider, MD  ezetimibe (ZETIA) 10 MG tablet Take 1 tablet (10 mg total) by mouth daily. 10/30/12  Yes Burtis Junes, NP  losartan (COZAAR) 25 MG tablet Take 0.5 tablets (12.5 mg total) by mouth at bedtime. 03/22/15  Yes Reyne Dumas, MD  predniSONE (DELTASONE) 10 MG tablet Take 20 mg by mouth daily.   Yes Historical Provider, MD  SYNTHROID 88 MCG tablet Take 88 mcg by mouth daily. 06/28/15  Yes Historical Provider, MD  torsemide (DEMADEX) 100 MG tablet Take 1 tablet (100 mg total) by mouth every morning. 03/22/15  Yes Reyne Dumas, MD   BP 127/51 mmHg  Pulse 77  Temp(Src) 98.8 F (37.1 C) (Oral)  Resp 26  Ht  5\' 1"  (1.549 m)  Wt 139 lb 5 oz (63.192 kg)  BMI 26.34 kg/m2  SpO2 97% Physical Exam Physical Exam  Nursing note and vitals reviewed. Constitutional: Elderly woman, appearing well developed, well nourished, non-toxic, and in no acute distress Head: Normocephalic and atraumatic.  Mouth/Throat: Oropharynx is clear, but dry..  Neck: Normal range of motion. Neck supple.  Cardiovascular: Normal rate and regular rhythm.  bilateral +1 pitting edema. Pulmonary/Chest: Effort normal and breath sounds normal.  Abdominal: Soft. There is no tenderness. There is no  rebound and no guarding.  Musculoskeletal: Normal range of motion.  Neurological: Alert, no facial droop, fluent speech, moves all extremities symmetrically Skin: Skin is warm and dry.  Psychiatric: Cooperative  ED Course  Procedures (including critical care time) Labs Review Labs Reviewed  CBC WITH DIFFERENTIAL/PLATELET - Abnormal; Notable for the following:    RBC 3.11 (*)    Hemoglobin 9.7 (*)    HCT 28.9 (*)    Neutrophils Relative % 87 (*)    Lymphocytes Relative 6 (*)    Lymphs Abs 0.3 (*)    All other components within normal limits  COMPREHENSIVE METABOLIC PANEL - Abnormal; Notable for the following:    Potassium 2.9 (*)    Chloride 99 (*)    Glucose, Bld 152 (*)    BUN 45 (*)    Creatinine, Ser 1.63 (*)    Calcium 7.8 (*)    Total Protein 4.2 (*)    Albumin <1.0 (*)    GFR calc non Af Amer 28 (*)    GFR calc Af Amer 32 (*)    All other components within normal limits  MAGNESIUM - Abnormal; Notable for the following:    Magnesium 1.6 (*)    All other components within normal limits  URINALYSIS W MICROSCOPIC - Abnormal; Notable for the following:    APPearance CLOUDY (*)    Glucose, UA 250 (*)    Hgb urine dipstick MODERATE (*)    Protein, ur >300 (*)    Squamous Epithelial / LPF FEW (*)    All other components within normal limits  I-STAT TROPOININ, ED - Abnormal; Notable for the following:    Troponin i, poc 0.10 (*)    All other components within normal limits  CBG MONITORING, ED - Abnormal; Notable for the following:    Glucose-Capillary 134 (*)    All other components within normal limits  PHOSPHORUS  PROTIME-INR  APTT    Imaging Review Dg Chest 2 View  07/17/2015   CLINICAL DATA:  Syncope  EXAM: CHEST  2 VIEW  COMPARISON:  03/17/2015  FINDINGS: Cardiomediastinal silhouette is stable. There is streaky atelectasis or infiltrate left lower lobe posteriorly. No pulmonary edema. Right lung is clear. Small left pleural effusion.  IMPRESSION: Streaky  atelectasis or infiltrate in left lower lobe posteriorly. No pulmonary edema. Small left pleural effusion.   Electronically Signed   By: Lahoma Crocker M.D.   On: 07/17/2015 13:04   Ct Head Wo Contrast  07/17/2015   CLINICAL DATA:  Altered mental status, syncope, weakness  EXAM: CT HEAD WITHOUT CONTRAST  TECHNIQUE: Contiguous axial images were obtained from the base of the skull through the vertex without intravenous contrast.  COMPARISON:  12/30/2008  FINDINGS: Mild cortical volume loss noted with proportional ventricular prominence. Areas of periventricular white matter hypodensity are most compatible with small vessel ischemic change. No acute hemorrhage, infarct, or mass lesion is identified. No midline shift. No skull fracture. Orbits and paranasal  sinuses are unremarkable.  IMPRESSION: Chronic findings as above.  No acute intracranial abnormality.   Electronically Signed   By: Conchita Paris M.D.   On: 07/17/2015 14:25   I have personally reviewed and evaluated these images and lab results as part of my medical decision-making.   EKG Interpretation   Date/Time:  Monday July 17 2015 11:47:38 EDT Ventricular Rate:  78 PR Interval:  159 QRS Duration: 150 QT Interval:  438 QTC Calculation: 499 R Axis:   6 Text Interpretation:  Sinus rhythm Multiform ventricular premature  complexes Right bundle branch block No significant change since last  tracing Confirmed by Brizeyda Holtmeyer MD, Tiena Manansala ([id number]) on 07/17/2015 11:50:58 AM      MDM   Final diagnoses:  Syncope, unspecified syncope type  Hypokalemia  Hypomagnesemia    81 year old female with history of CAD, hypertension, and hyperlipidemia who presents after syncope. She on presentation is well-appearing, nontoxic, in no acute distress. Vital signs are not concerning in the ED. Exam including cardiopulmonary exam is unremarkable. EKG shows some QTC prolongation, with occasional PVCs, but no acute ischemic changes. Blood work is concerning for elevated  troponin of 0.11 as well as hypokalemia of 2.9 and hypomagnesemia of 1.6. It certainly could be in the setting of her hypokalemia that she may have had a arrhythmia that could have caused her syncopal episode and subsequent troponin elevation. I repleted her potassium with 80 mEq of oral potassium and 10 mEq of IV potassium chloride. She also received 2 g of magnesium sulfate. She is admitted to the hospitalist service under telemetry for further cardiac monitoring as well as repletion of her electrolytes.    Forde Dandy, MD 07/17/15 [phone]

## 2015-07-17 NOTE — H&P (Addendum)
[facility] History and Physical  Erika [name] LOV:[id number] DOB: 1928/11/08 DOA: 07/17/2015  Referring physician:  PCP: Precious Reel, MD   Chief Complaint: Syncope  HPI: Erika [name] is a 81 y.o. 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 She had her husband drive her to work as she did not feel strong enough to drive. While sitting down in her-she became increasingly lightheaded and then had loss of consciousness. She reported waking up on the floor. She denies chest pain, shortness of breath, palpitations, focal neurological deficits prior to event. She denies sustaining injuries from syncopal event since coworkers nearby gently later on the floor. Lab work performed in the emergency department show the presence of acute on chronic renal failure and hypokalemia with potassium of 2.9.                                                                                         Review of Systems:  Constitutional:  No weight loss, night sweats, Fevers, chills, positive for fatigue and generalized weakness.  HEENT:  No headaches, Difficulty swallowing,Tooth/dental problems,Sore throat,  No sneezing, itching, ear ache, nasal congestion, post nasal drip,  Cardio-vascular:  No chest pain, Orthopnea, PND, positive for swelling in lower extremities, anasarca, dizziness, palpitations  GI:  No heartburn, indigestion, abdominal pain, nausea, vomiting, diarrhea, change in bowel habits, loss of appetite  Resp:  No shortness of breath with exertion or at rest. No excess mucus, no productive cough, No non-productive cough, No coughing up of blood.No change in color of mucus.No wheezing.No chest wall deformity  Skin:  no rash or lesions.  GU:  no  dysuria, change in color of urine, no urgency or frequency. No flank pain.  Musculoskeletal:  No joint pain or swelling. No decreased range of motion. No back pain.  Psych:  No change in mood or affect. No depression or anxiety. No memory loss.   Past Medical History  Diagnosis Date  . Hypertension   . Hyperlipidemia   . Myocardial infarction 1990  . CAD (coronary artery disease)     post MI in 67  . Osteopenia   . Hypothyroidism   . Fall Sept 2012    with left zygoma fracture  . Normal nuclear stress test July 2012    No ischemia.   . Right bundle branch block    Past Surgical History  Procedure Laterality Date  . Cardiac catheterization  10/18/1994    Single vessel disease with a severe stenosis in the RCA which had recannalized since prior study in 1990. Managed medically  . Cardiac catheterization  09/22/89  . Cataract extraction, bilateral  10/2009   Social History:  reports that she has never smoked. She has never used smokeless tobacco. She reports that she does not drink alcohol or use illicit drugs.  No Known Allergies  Family History  Problem Relation Age of Onset  . Heart attack Father 70  .  Heart attack Mother 64  . Heart disease Brother   . Heart disease Brother      Prior to Admission medications   Medication Sig Start Date End Date Taking? Authorizing Provider  acetaminophen (TYLENOL) 325 MG tablet Take 650 mg by mouth every 6 (six) hours as needed for mild pain or moderate pain.   Yes Historical Provider, MD  atorvastatin (LIPITOR) 40 MG tablet Take 20 mg by mouth daily. 07/04/15  Yes Historical Provider, MD  ezetimibe (ZETIA) 10 MG tablet Take 1 tablet (10 mg total) by mouth daily. 10/30/12  Yes Burtis Junes, NP  losartan (COZAAR) 25 MG tablet Take 0.5 tablets (12.5 mg total) by mouth at bedtime. 03/22/15  Yes Reyne Dumas, MD  predniSONE (DELTASONE) 10 MG tablet Take 20 mg by mouth daily.   Yes Historical Provider, MD  SYNTHROID 88 MCG tablet Take 88  mcg by mouth daily. 06/28/15  Yes Historical Provider, MD  torsemide (DEMADEX) 100 MG tablet Take 1 tablet (100 mg total) by mouth every morning. 03/22/15  Yes Reyne Dumas, MD   Physical Exam: Filed Vitals:   07/17/15 1219 07/17/15 1315 07/17/15 1330 07/17/15 1500  BP:  134/51 133/50 127/51  Pulse:  78 80 77  Temp:      TempSrc:      Resp:  22 18 26   Height:      Weight: 63.192 kg (139 lb 5 oz)     SpO2:  98% 96% 97%    Wt Readings from Last 3 Encounters:  07/17/15 63.192 kg (139 lb 5 oz)  03/22/15 70.988 kg (156 lb 8 oz)  03/07/15 70.052 kg (154 lb 7 oz)    General:  Appears calm and comfortable, no acute distress she is awake and alert, can't provide history. Eyes: PERRL, normal lids, irises & conjunctiva ENT: grossly normal hearing, lips & tongue, dry oral mucosa Neck: no LAD, masses or thyromegaly Cardiovascular: 26 systolic ejection murmur, regular rate and rhythm, no rubs or gallops. Has 3+ bilateral LE edema. Telemetry: SR, no arrhythmias  Respiratory: CTA bilaterally, no w/r/r. Normal respiratory effort. Abdomen: soft, ntnd Skin: no rash or induration seen on limited exam Musculoskeletal: grossly normal tone BUE/BLE Psychiatric: grossly normal mood and affect, speech fluent and appropriate Neurologic: grossly non-focal.          Labs on Admission:  Basic Metabolic Panel:  Recent Labs Lab 07/17/15 1211  NA 137  K 2.9*  CL 99*  CO2 27  GLUCOSE 152*  BUN 45*  CREATININE 1.63*  CALCIUM 7.8*  MG 1.6*  PHOS 3.8   Liver Function Tests:  Recent Labs Lab 07/17/15 1211  AST 25  ALT 15  ALKPHOS 62  BILITOT 0.4  PROT 4.2*  ALBUMIN <1.0*   No results for input(s): LIPASE, AMYLASE in the last 168 hours. No results for input(s): AMMONIA in the last 168 hours. CBC:  Recent Labs Lab 07/17/15 1211  WBC 6.0  NEUTROABS 5.2  HGB 9.7*  HCT 28.9*  MCV 92.9  PLT 200   Cardiac Enzymes: No results for input(s): CKTOTAL, CKMB, CKMBINDEX, TROPONINI in the  last 168 hours.  BNP (last 3 results)  Recent Labs  03/07/15 1100  BNP 163.8*    ProBNP (last 3 results) No results for input(s): PROBNP in the last 8760 hours.  CBG:  Recent Labs Lab 07/17/15 1212  GLUCAP 134*    Radiological Exams on Admission: Dg Chest 2 View  07/17/2015   CLINICAL DATA:  Syncope  EXAM:  CHEST  2 VIEW  COMPARISON:  03/17/2015  FINDINGS: Cardiomediastinal silhouette is stable. There is streaky atelectasis or infiltrate left lower lobe posteriorly. No pulmonary edema. Right lung is clear. Small left pleural effusion.  IMPRESSION: Streaky atelectasis or infiltrate in left lower lobe posteriorly. No pulmonary edema. Small left pleural effusion.   Electronically Signed   By: Lahoma Crocker M.D.   On: 07/17/2015 13:04   Ct Head Wo Contrast  07/17/2015   CLINICAL DATA:  Altered mental status, syncope, weakness  EXAM: CT HEAD WITHOUT CONTRAST  TECHNIQUE: Contiguous axial images were obtained from the base of the skull through the vertex without intravenous contrast.  COMPARISON:  12/30/2008  FINDINGS: Mild cortical volume loss noted with proportional ventricular prominence. Areas of periventricular white matter hypodensity are most compatible with small vessel ischemic change. No acute hemorrhage, infarct, or mass lesion is identified. No midline shift. No skull fracture. Orbits and paranasal sinuses are unremarkable.  IMPRESSION: Chronic findings as above.  No acute intracranial abnormality.   Electronically Signed   By: Conchita Paris M.D.   On: 07/17/2015 14:25    EKG: Independently reviewed. Sinus rhythm  Assessment/Plan Principal Problem:   Syncope and collapse Active Problems:   Dehydration   AKI (acute kidney injury)   Hypokalemia   Syncope   1. Syncope. Suspect secondary to hypovolemia secondary to diuretic use. She takes torsemide 100 mg by mouth daily for nephrotic syndrome. Will hold diuretic therapy, given a 500 mL bolus of normal saline, correct  electrolytes, monitor volume status, admitted to telemetry.  Initial labs showing mild elevation in troponin at 0.10. She denies chest pain, could be secondary to renal failure. Will cycle troponins 3 sets overnight and obtain a transthoracic echocardiogram in morning. 2. Acute on chronic renal failure. Patient with history of nephrotic syndrome secondary to membranous nephropathy, status post renal biopsy in April 2016. Labs showing increase in creatinine to 1.63 from 1.07. She had been on torsemide 100 mg by mouth daily. Will hold torsemide and provide a 500 mL bolus of normal saline. Repeat labs in a.m. 3. Membranous nephropathy. Status post renal biopsy April 2016. She was taking at torsemide 100 mg by mouth daily which will be held due to syncope. Will continue steroids therapy with prednisone 20 mg by mouth daily.  4.  Hypokalemia. Labs showing potassium of 2.9. Will provide potassium replacement with K Dur 40 mEq by mouth 2.  5. Hypomagnesemia. She was given 2 g of IV magnesium in the emergency room. Repeat magnesium levels in a.m. 6. Hypothyroidism. Will check a TSH, meanwhile continue levothyroxine 88 g by mouth daily. 7. Hypertension. Will discontinue Cozaar due to acute on chronic renal failure. Follow blood pressures. 8. Dyslipidemia. Continue Lipitor 20 mg by mouth daily    Code Status: CODE STATUS discussed with patient and husband was present at bedside, she is a DO NOT RESUSCITATE DVT Prophylaxis: Lovenox Family Communication: Spoke to her husband was present at bedside Disposition Plan: Will admit to the inpatient service  Time spent: 65 min  Kelvin Cellar [facility] Pager [phone]

## 2015-07-18 ENCOUNTER — Inpatient Hospital Stay (HOSPITAL_COMMUNITY): Payer: Medicare Other

## 2015-07-18 DIAGNOSIS — R55 Syncope and collapse: Secondary | ICD-10-CM

## 2015-07-18 LAB — CBC
HEMATOCRIT: 24.2 % — AB (ref 36.0–46.0)
Hemoglobin: 8.2 g/dL — ABNORMAL LOW (ref 12.0–15.0)
MCH: 31.2 pg (ref 26.0–34.0)
MCHC: 33.9 g/dL (ref 30.0–36.0)
MCV: 92 fL (ref 78.0–100.0)
Platelets: 188 10*3/uL (ref 150–400)
RBC: 2.63 MIL/uL — ABNORMAL LOW (ref 3.87–5.11)
RDW: 15.2 % (ref 11.5–15.5)
WBC: 3.5 10*3/uL — ABNORMAL LOW (ref 4.0–10.5)

## 2015-07-18 LAB — BASIC METABOLIC PANEL
Anion gap: 7 (ref 5–15)
BUN: 48 mg/dL — AB (ref 6–20)
CHLORIDE: 102 mmol/L (ref 101–111)
CO2: 28 mmol/L (ref 22–32)
Calcium: 7.3 mg/dL — ABNORMAL LOW (ref 8.9–10.3)
Creatinine, Ser: 1.71 mg/dL — ABNORMAL HIGH (ref 0.44–1.00)
GFR calc Af Amer: 30 mL/min — ABNORMAL LOW (ref >60)
GFR calc non Af Amer: 26 mL/min — ABNORMAL LOW (ref >60)
GLUCOSE: 108 mg/dL — AB (ref 65–99)
POTASSIUM: 3.4 mmol/L — AB (ref 3.5–5.1)
Sodium: 137 mmol/L (ref 135–145)

## 2015-07-18 LAB — CREATININE, URINE, RANDOM: Creatinine, Urine: 86.62 mg/dL

## 2015-07-18 LAB — TROPONIN I: Troponin I: 0.09 ng/mL — ABNORMAL HIGH (ref <0.031)

## 2015-07-18 LAB — MAGNESIUM: Magnesium: 2 mg/dL (ref 1.7–2.4)

## 2015-07-18 LAB — SODIUM, URINE, RANDOM: Sodium, Ur: 29 mmol/L

## 2015-07-18 MED ORDER — SODIUM CHLORIDE 0.9 % IV SOLN: INTRAVENOUS | Status: DC

## 2015-07-18 MED ORDER — ENOXAPARIN SODIUM 30 MG/0.3ML ~~LOC~~ SOLN
30.0000 mg | SUBCUTANEOUS | Status: DC
  Administered 2015-07-18 – 2015-07-19 (×2): 30 mg via SUBCUTANEOUS
  Filled 2015-07-18 (×2): qty 0.3

## 2015-07-18 NOTE — Clinical Documentation Improvement (Signed)
Hospitalist  Can the diagnosis of CKD be further specified?   CKD Stage I - GFR greater than or equal to 90  CKD Stage II - GFR 60-89  CKD Stage III - GFR 30-59  CKD Stage IV - GFR 15-29  CKD Stage V - GFR < 15  ESRD (End Stage Renal Disease)  Other condition  Unable to clinically determine  Supporting Information: : (risk factors, signs and symptoms, diagnostics, treatment)  White female  GFR's running 28,28, 26  Please exercise your independent, professional judgment when responding. A specific answer is not anticipated or expected.  Thank You, [place] [phone]

## 2015-07-18 NOTE — Progress Notes (Addendum)
[facility] PROGRESS NOTE  Assessment/Plan: Syncope and collapse: - Very active still working 81 year old. She describes very nicely orthostatic symptoms when this started. - Orthostatic vitals were positive, diuretics and ARB were held on admission. - We check orthostatics again, will check urine creatinine and urine sodium. - She has 2+ lower extremity edema or add TED hose. - I am concerned that we are going have to accept a certain degree of lower extremity edema to limit her orthostasis. - We can resume her oral torsemide in the morning at a lower dose. - I explained the risk and benefits of going home to her, she agreed to stay an additional day.  AKI (acute kidney injury): - Baseline creatinine around 1, on admission it was 1.6-1.7. Repeated this morning continues to be 1.7. - She has 2-3+ lower extremity edema - Apply TED hose, liberalized diet and recheck a basic metabolic panel in the morning.  Hypokalemia: - Likely due to diuretic therapy, she is on oral repletion and is improving. - Continue to repeat orally recheck in the morning.   Code Status: CODE STATUS discussed with patient and husband was present at bedside, she is a DO NOT RESUSCITATE DVT Prophylaxis: Lovenox Family Communication: Spoke to her husband was present at bedside Disposition Plan: Will admit to the inpatient service   Consultants:  none  Procedures:  cxr  Antibiotics:  None  HPI/Subjective: She relates no lightheadedness, she is hungry and would like to go home.  Objective: Filed Vitals:   07/17/15 1830 07/17/15 1901 07/17/15 2046 07/18/15 0439  BP: 132/38 138/57 147/65 149/46  Pulse: 81 84 96 80  Temp:  98.2 F (36.8 C) 98.6 F (37 C) 98.9 F (37.2 C)  TempSrc:  Oral Oral Oral  Resp: 18  18 18   Height:      Weight:      SpO2: 95% 99% 100% 95%    Intake/Output Summary (Last 24 hours) at 07/18/15 0857 Last data filed at 07/17/15 2300  Gross per 24 hour  Intake     240 ml  Output      0 ml  Net    240 ml   Filed Weights   07/17/15 1219  Weight: 63.192 kg (139 lb 5 oz)    Exam:  General: Alert, awake, oriented x3, in no acute distress.  HEENT: No bruits, no goiter.  Heart: Regular rate and rhythm, without murmurs, rubs, gallops.  Lungs: Good air movement, bilateral air movement.  Abdomen: Soft, nontender, nondistended, positive bowel sounds.  Neuro: Grossly intact, nonfocal.   Data Reviewed: Basic Metabolic Panel:  Recent Labs Lab 07/17/15 1211 07/17/15 1647 07/18/15 0430  NA 137  --  137  K 2.9*  --  3.4*  CL 99*  --  102  CO2 27  --  28  GLUCOSE 152*  --  108*  BUN 45*  --  48*  CREATININE 1.63* 1.63* 1.71*  CALCIUM 7.8*  --  7.3*  MG 1.6*  --  2.0  PHOS 3.8  --   --    Liver Function Tests:  Recent Labs Lab 07/17/15 1211  AST 25  ALT 15  ALKPHOS 62  BILITOT 0.4  PROT 4.2*  ALBUMIN <1.0*   No results for input(s): LIPASE, AMYLASE in the last 168 hours. No results for input(s): AMMONIA in the last 168 hours. CBC:  Recent Labs Lab 07/17/15 1211 07/17/15 1647 07/18/15 0430  WBC 6.0 4.5 3.5*  NEUTROABS 5.2  --   --  HGB 9.7* 9.7* 8.2*  HCT 28.9* 28.9* 24.2*  MCV 92.9 91.5 92.0  PLT 200 204 188   Cardiac Enzymes:  Recent Labs Lab 07/17/15 1647 07/17/15 2230 07/18/15 0430  TROPONINI 0.08* 0.08* 0.09*   BNP (last 3 results)  Recent Labs  03/07/15 1100  BNP 163.8*    ProBNP (last 3 results) No results for input(s): PROBNP in the last 8760 hours.  CBG:  Recent Labs Lab 07/17/15 1212  GLUCAP 134*    No results found for this or any previous visit (from the past 240 hour(s)).   Studies: Dg Chest 2 View  07/17/2015   CLINICAL DATA:  Syncope  EXAM: CHEST  2 VIEW  COMPARISON:  03/17/2015  FINDINGS: Cardiomediastinal silhouette is stable. There is streaky atelectasis or infiltrate left lower lobe posteriorly. No pulmonary edema. Right lung is clear. Small left pleural effusion.  IMPRESSION:  Streaky atelectasis or infiltrate in left lower lobe posteriorly. No pulmonary edema. Small left pleural effusion.   Electronically Signed   By: Lahoma Crocker M.D.   On: 07/17/2015 13:04   Ct Head Wo Contrast  07/17/2015   CLINICAL DATA:  Altered mental status, syncope, weakness  EXAM: CT HEAD WITHOUT CONTRAST  TECHNIQUE: Contiguous axial images were obtained from the base of the skull through the vertex without intravenous contrast.  COMPARISON:  12/30/2008  FINDINGS: Mild cortical volume loss noted with proportional ventricular prominence. Areas of periventricular white matter hypodensity are most compatible with small vessel ischemic change. No acute hemorrhage, infarct, or mass lesion is identified. No midline shift. No skull fracture. Orbits and paranasal sinuses are unremarkable.  IMPRESSION: Chronic findings as above.  No acute intracranial abnormality.   Electronically Signed   By: Conchita Paris M.D.   On: 07/17/2015 14:25    Scheduled Meds: . atorvastatin  20 mg Oral Daily  . enoxaparin (LOVENOX) injection  40 mg Subcutaneous Q24H  . ezetimibe  10 mg Oral Daily  . levothyroxine  88 mcg Oral QAC breakfast  . predniSONE  20 mg Oral Daily  . sodium chloride  500 mL Intravenous Once  . sodium chloride  3 mL Intravenous Q12H   Continuous Infusions:   Time Spent: 25 min   Charlynne Cousins  [facility] Pager [phone]. If 7PM-7AM, please contact night-coverage at [email or web address], password [facility] 07/18/2015, 8:57 AM  LOS: 1 day

## 2015-07-18 NOTE — Progress Notes (Signed)
UR Completed. Melania Kirks, RN, BSN.  [phone] 

## 2015-07-18 NOTE — Progress Notes (Signed)
  Echocardiogram 2D Echocardiogram has been performed.  Erika [name] 07/18/2015, 9:50 AM

## 2015-07-19 DIAGNOSIS — R55 Syncope and collapse: Secondary | ICD-10-CM

## 2015-07-19 DIAGNOSIS — E876 Hypokalemia: Secondary | ICD-10-CM

## 2015-07-19 DIAGNOSIS — N179 Acute kidney failure, unspecified: Principal | ICD-10-CM

## 2015-07-19 LAB — BASIC METABOLIC PANEL
ANION GAP: 9 (ref 5–15)
BUN: 45 mg/dL — ABNORMAL HIGH (ref 6–20)
CO2: 26 mmol/L (ref 22–32)
Calcium: 7.1 mg/dL — ABNORMAL LOW (ref 8.9–10.3)
Chloride: 101 mmol/L (ref 101–111)
Creatinine, Ser: 1.73 mg/dL — ABNORMAL HIGH (ref 0.44–1.00)
GFR calc Af Amer: 30 mL/min — ABNORMAL LOW (ref >60)
GFR, EST NON AFRICAN AMERICAN: 26 mL/min — AB (ref >60)
GLUCOSE: 113 mg/dL — AB (ref 65–99)
POTASSIUM: 2.8 mmol/L — AB (ref 3.5–5.1)
Sodium: 136 mmol/L (ref 135–145)

## 2015-07-19 LAB — IRON AND TIBC: Iron: 22 ug/dL — ABNORMAL LOW (ref 28–170)

## 2015-07-19 LAB — FERRITIN: Ferritin: 685 ng/mL — ABNORMAL HIGH (ref 11–307)

## 2015-07-19 LAB — VITAMIN B12: VITAMIN B 12: 295 pg/mL (ref 180–914)

## 2015-07-19 MED ORDER — POTASSIUM CHLORIDE CRYS ER 20 MEQ PO TBCR
40.0000 meq | EXTENDED_RELEASE_TABLET | ORAL | Status: AC
  Administered 2015-07-19 (×2): 40 meq via ORAL
  Filled 2015-07-19 (×2): qty 2

## 2015-07-19 MED ORDER — CYCLOPHOSPHAMIDE 50 MG PO TABS
50.0000 mg | ORAL_TABLET | Freq: Every day | ORAL | Status: DC
  Filled 2015-07-19 (×2): qty 1

## 2015-07-19 MED ORDER — DARBEPOETIN ALFA 40 MCG/0.4ML IJ SOSY
40.0000 ug | PREFILLED_SYRINGE | INTRAMUSCULAR | Status: DC
  Administered 2015-07-19: 40 ug via SUBCUTANEOUS
  Filled 2015-07-19 (×2): qty 0.4

## 2015-07-19 MED ORDER — TORSEMIDE 20 MG PO TABS
40.0000 mg | ORAL_TABLET | Freq: Every day | ORAL | Status: DC
  Administered 2015-07-20: 40 mg via ORAL
  Filled 2015-07-19: qty 2

## 2015-07-19 MED ORDER — POTASSIUM CHLORIDE 10 MEQ/50ML IV SOLN
INTRAVENOUS | Status: AC
  Filled 2015-07-19: qty 50

## 2015-07-19 MED ORDER — POTASSIUM CHLORIDE 10 MEQ/100ML IV SOLN
10.0000 meq | INTRAVENOUS | Status: AC
  Administered 2015-07-19 (×3): 10 meq via INTRAVENOUS
  Filled 2015-07-19 (×3): qty 100

## 2015-07-19 NOTE — Progress Notes (Signed)
[facility] PROGRESS NOTE  Assessment/Plan: Syncope and collapse: - Orthostatic vitals were borderline positive, diuretics and ARB were held on admission. - Off IVC fluids from today, resume torsemide in a.m. if stable - Echo with normal EF and grade 1 dysfunction, no wall motion abnormalities -No events on telemetry  AKI (acute kidney injury): - Baseline creatinine around 1, on admission it was 1.6-1.7. Repeated again around 1.7. - ARB on hold -Renal consulted , has nephrotic syndrome with membranous  Glomerulonephritis, has been on a prednisone taper for this and cytoxan  Hypokalemia: - Likely due to diuretic therapy,  - repleted  CODE STATUS: DNR  DVT Prophylaxis: Lovenox Family Communication: none at bedside Disposition Plan: home tomorrow if stable    Consultants:  none  Procedures:  cxr  Antibiotics:  None  HPI/Subjective: Feels much better, less dizzy today, denies any dyspnea has mild leg swelling,   Objective: Filed Vitals:   07/18/15 1148 07/18/15 1427 07/18/15 2055 07/19/15 0502  BP:  117/39 133/41 127/46  Pulse:  70 88 90  Temp:  98.5 F (36.9 C) 99.8 F (37.7 C) 99 F (37.2 C)  TempSrc:  Oral Oral Oral  Resp:  18 18 18   Height:      Weight:      SpO2: 99% 97% 97% 94%   No intake or output data in the 24 hours ending 07/19/15 1656 Filed Weights   07/17/15 1219  Weight: 63.192 kg (139 lb 5 oz)    Exam:  General: Alert, awake, oriented x3, in no acute distress.  HEENT: No bruits, no goiter.  Heart: Regular rate and rhythm, without murmurs, rubs, gallops.  Lungs: Good air movement, bilateral air movement.  Abdomen: Soft, nontender, nondistended, positive bowel sounds.  Neuro: Grossly intact, nonfocal Ext: Has 1+ edema, wearing compression stockings  Data Reviewed: Basic Metabolic Panel:  Recent Labs Lab 07/17/15 1211 07/17/15 1647 07/18/15 0430 07/19/15 0452  NA 137  --  137 136  K 2.9*  --  3.4* 2.8*  CL 99*  --   102 101  CO2 27  --  28 26  GLUCOSE 152*  --  108* 113*  BUN 45*  --  48* 45*  CREATININE 1.63* 1.63* 1.71* 1.73*  CALCIUM 7.8*  --  7.3* 7.1*  MG 1.6*  --  2.0  --   PHOS 3.8  --   --   --    Liver Function Tests:  Recent Labs Lab 07/17/15 1211  AST 25  ALT 15  ALKPHOS 62  BILITOT 0.4  PROT 4.2*  ALBUMIN <1.0*   No results for input(s): LIPASE, AMYLASE in the last 168 hours. No results for input(s): AMMONIA in the last 168 hours. CBC:  Recent Labs Lab 07/17/15 1211 07/17/15 1647 07/18/15 0430  WBC 6.0 4.5 3.5*  NEUTROABS 5.2  --   --   HGB 9.7* 9.7* 8.2*  HCT 28.9* 28.9* 24.2*  MCV 92.9 91.5 92.0  PLT 200 204 188   Cardiac Enzymes:  Recent Labs Lab 07/17/15 1647 07/17/15 2230 07/18/15 0430  TROPONINI 0.08* 0.08* 0.09*   BNP (last 3 results)  Recent Labs  03/07/15 1100  BNP 163.8*    ProBNP (last 3 results) No results for input(s): PROBNP in the last 8760 hours.  CBG:  Recent Labs Lab 07/17/15 1212  GLUCAP 134*    No results found for this or any previous visit (from the past 240 hour(s)).   Studies: No results found.  Scheduled Meds: .  atorvastatin  20 mg Oral Daily  . [START ON 07/20/2015] cyclophosphamide  50 mg Oral Daily  . darbepoetin (ARANESP) injection - NON-DIALYSIS  40 mcg Subcutaneous Q Wed-1800  . enoxaparin (LOVENOX) injection  30 mg Subcutaneous Q24H  . ezetimibe  10 mg Oral Daily  . levothyroxine  88 mcg Oral QAC breakfast  . predniSONE  20 mg Oral Daily  . sodium chloride  500 mL Intravenous Once  . sodium chloride  3 mL Intravenous Q12H  . [START ON 07/20/2015] torsemide  40 mg Oral Daily   Continuous Infusions:   Time Spent: 35 min   [facility] Pager [phone]. If 7PM-7AM, please contact night-coverage at [email or web address], password [facility] 07/19/2015, 4:56 PM  LOS: 2 days

## 2015-07-19 NOTE — Consult Note (Signed)
Reason for Consult:AKI/CKD Referring Physician: Broadus John, MD  Erika [name] is an 81 y.o. female.  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  Erika [name] was admitted on 07/17/15 after a syncopal event.  She had a previous episode last month while sitting at the table in July but did not seek medical attention at that time.  This instance occurred while she was working at the [employer] and started to feel poorly and sat down at her desk and then passed out.  She did report palpitations but does not remember much about the incident.    Her renal history started when she was admitted to [facility] on March 17, 2015 with worsening lower extremity edema that started 6 weeks prior to admission. She had been evaluated by her primary care physician, Erika [name], and was appropriately started on oral furosemide and had eye and lip swelling and was admitted to [facility] for further evaluation and management. She initially had a cardiac workup with Erika [name] due to her history of coronary disease. However, she had a normal EKG and echocardiogram. A 24-hour urine protein was significant for 2.5 gm, however, she had a low albumin, normal complements and SPEP. She developed acute kidney injury with hyperkalemia in the setting of aggressive diuresis and concomitant ACE inhibitor use. Her ACE inhibitor was held. She was diuresed, underwent a renal biopsy on March 20, 2015 which revealed membranous nephropathy as well as moderate arterionephrosclerosis. She was anti-PLA2R negative. 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 I discussed the case with her primary care physician, Erika [name], who ordered a CT scan of the  chest and abdomen to rule out an underlying primary malignancy. This was negative. She deferred a mammogram and colonoscopy. After discussion of options for therapy, it was decided to proceed with oral cytoxan and prednisone. She has lost almot 20lbs and was feeling much better with marked improvment of her lower extremity edema. She is now back to work full time and overall feels well. She was on losartan and tolerating it. She was prescribed cytoxan 50mg  daily and prednisone 40mg  daily on 04/05/15, however she did not pick up the cytoxan until 06/22/15 (despite being told to pick it up at her June visit). Since her last visit she has been experiencing cramps in her hands and legs, thinning of her hair, and difficulty sleeping. She also had a syncopal episode on 06/18/15 and EMS was called but refused transfer to the hospital. She reports she was sitting at the table and felt faint and passed out. She did not contact Erika [name], nor our office. It has now recurred and we were consulted to help manage her AKI/CKD as well as comment on her diuretic therapy.   Trend in Creatinine:  CREATININE, SER  Date/Time Value Ref Range Status  07/19/2015 04:52 AM 1.73* 0.44 - 1.00 mg/dL Final  07/18/2015 04:30 AM 1.71* 0.44 - 1.00 mg/dL Final  07/17/2015 04:47 PM 1.63* 0.44 - 1.00 mg/dL Final  07/17/2015 12:11 PM 1.63* 0.44 - 1.00 mg/dL Final  03/22/2015 05:00 AM 1.07 0.50 - 1.10 mg/dL Final  03/21/2015 11:15 AM 1.20* 0.50 - 1.10 mg/dL Final  03/20/2015 05:30 AM 1.23* 0.50 - 1.10 mg/dL Final  03/19/2015 04:13 AM 1.12* 0.50 - 1.10  mg/dL Final  03/18/2015 04:05 AM 1.17* 0.50 - 1.10 mg/dL Final  03/17/2015 10:00 AM 1.18* 0.50 - 1.10 mg/dL Final  03/07/2015 11:00 AM 1.06 0.50 - 1.10 mg/dL Final  02/17/2015 06:12 PM 0.96 0.50 - 1.10 mg/dL Final  02/06/2015 09:45 AM 0.71 0.50 - 1.10 mg/dL Final  06/15/2009 02:20 PM 0.72 0.4 - 1.2 mg/dL Final  12/30/2008 02:15 PM 0.72 0.4 - 1.2 mg/dL Final    PMH:    Past Medical History  Diagnosis Date  . Hypertension   . Hyperlipidemia   . Myocardial infarction 1990  . CAD (coronary artery disease)     post MI in 65  . Osteopenia   . Hypothyroidism   . Fall Sept 2012    with left zygoma fracture  . Normal nuclear stress test July 2012    No ischemia.   . Right bundle branch block     PSH:   Past Surgical History  Procedure Laterality Date  . Cardiac catheterization  10/18/1994    Single vessel disease with a severe stenosis in the RCA which had recannalized since prior study in 1990. Managed medically  . Cardiac catheterization  09/22/89  . Cataract extraction, bilateral  10/2009    Allergies: No Known Allergies  Medications:   Prior to Admission medications   Medication Sig Start Date End Date Taking? Authorizing Provider  acetaminophen (TYLENOL) 325 MG tablet Take 650 mg by mouth every 6 (six) hours as needed for mild pain or moderate pain.   Yes Historical Provider, MD  atorvastatin (LIPITOR) 40 MG tablet Take 20 mg by mouth daily. 07/04/15  Yes Historical Provider, MD  ezetimibe (ZETIA) 10 MG tablet Take 1 tablet (10 mg total) by mouth daily. 10/30/12  Yes Erika Junes, NP  losartan (COZAAR) 25 MG tablet Take 0.5 tablets (12.5 mg total) by mouth at bedtime. 03/22/15  Yes Reyne Dumas, MD  predniSONE (DELTASONE) 10 MG tablet Take 20 mg by mouth daily.   Yes Historical Provider, MD  SYNTHROID 88 MCG tablet Take 88 mcg by mouth daily. 06/28/15  Yes Historical Provider, MD  torsemide (DEMADEX) 100 MG tablet Take 1 tablet (100 mg total) by mouth every morning. 03/22/15  Yes Reyne Dumas, MD    Inpatient medications: . atorvastatin  20 mg Oral Daily  . enoxaparin (LOVENOX) injection  30 mg Subcutaneous Q24H  . ezetimibe  10 mg Oral Daily  . levothyroxine  88 mcg Oral QAC breakfast  . potassium chloride  40 mEq Oral Q2H  . predniSONE  20 mg Oral Daily  . sodium chloride  500 mL Intravenous Once  . sodium chloride  3 mL Intravenous  Q12H    Discontinued Meds:   Medications Discontinued During This Encounter  Medication Reason  . potassium chloride (KLOR-CON) packet 80 mEq Dose change  . denosumab (PROLIA) 60 MG/ML SOLN injection Completed Course  . atorvastatin (LIPITOR) 20 MG tablet Dose change  . levothyroxine (SYNTHROID, LEVOTHROID) 75 MCG tablet Dose change  . potassium chloride (KLOR-CON) packet 80 mEq   . 0.9 %  sodium chloride infusion   . 0.9 %  sodium chloride infusion   . enoxaparin (LOVENOX) injection 40 mg     Social History:  reports that she has never smoked. She has never used smokeless tobacco. She reports that she does not drink alcohol or use illicit drugs.  Family History:   Family History  Problem Relation Age of Onset  . Heart attack Father 10  . Heart attack Mother  81  . Heart disease Brother   . Heart disease Brother     Pertinent items are noted in HPI. Weight change:   Intake/Output Summary (Last 24 hours) at 07/19/15 0853 Last data filed at 07/18/15 0906  Gross per 24 hour  Intake    236 ml  Output      0 ml  Net    236 ml   BP 127/46 mmHg  Pulse 90  Temp(Src) 99 F (37.2 C) (Oral)  Resp 18  Ht 5\' 1"  (1.549 m)  Wt 63.192 kg (139 lb 5 oz)  BMI 26.34 kg/m2  SpO2 94% Filed Vitals:   07/18/15 1148 07/18/15 1427 07/18/15 2055 07/19/15 0502  BP:  117/39 133/41 127/46  Pulse:  70 88 90  Temp:  98.5 F (36.9 C) 99.8 F (37.7 C) 99 F (37.2 C)  TempSrc:  Oral Oral Oral  Resp:  18 18 18   Height:      Weight:      SpO2: 99% 97% 97% 94%     General appearance: alert, cooperative and no distress Head: Normocephalic, without obvious abnormality, atraumatic Eyes: negative findings: lids and lashes normal, conjunctivae and sclerae normal and corneas clear Resp: clear to auscultation bilaterally Cardio: irregularly irregular rhythm and no rub GI: soft, non-tender; bowel sounds normal; no masses,  no organomegaly Extremities: edema 1+ pretibial edema  Labs: Basic  Metabolic Panel:  Recent Labs Lab 07/17/15 1211 07/17/15 1647 07/18/15 0430 07/19/15 0452  NA 137  --  137 136  K 2.9*  --  3.4* 2.8*  CL 99*  --  102 101  CO2 27  --  28 26  GLUCOSE 152*  --  108* 113*  BUN 45*  --  48* 45*  CREATININE 1.63* 1.63* 1.71* 1.73*  ALBUMIN <1.0*  --   --   --   CALCIUM 7.8*  --  7.3* 7.1*  PHOS 3.8  --   --   --    Liver Function Tests:  Recent Labs Lab 07/17/15 1211  AST 25  ALT 15  ALKPHOS 62  BILITOT 0.4  PROT 4.2*  ALBUMIN <1.0*   No results for input(s): LIPASE, AMYLASE in the last 168 hours. No results for input(s): AMMONIA in the last 168 hours. CBC:  Recent Labs Lab 07/17/15 1211 07/17/15 1647 07/18/15 0430  WBC 6.0 4.5 3.5*  NEUTROABS 5.2  --   --   HGB 9.7* 9.7* 8.2*  HCT 28.9* 28.9* 24.2*  MCV 92.9 91.5 92.0  PLT 200 204 188   PT/INR: @LABRCNTIP (inr:5) Cardiac Enzymes: ) Recent Labs Lab 07/17/15 1647 07/17/15 2230 07/18/15 0430  TROPONINI 0.08* 0.08* 0.09*   CBG:  Recent Labs Lab 07/17/15 1212  GLUCAP 134*    Iron Studies: No results for input(s): IRON, TIBC, TRANSFERRIN, FERRITIN in the last 168 hours.  Xrays/Other Studies: Dg Chest 2 View  07/17/2015   CLINICAL DATA:  Syncope  EXAM: CHEST  2 VIEW  COMPARISON:  03/17/2015  FINDINGS: Cardiomediastinal silhouette is stable. There is streaky atelectasis or infiltrate left lower lobe posteriorly. No pulmonary edema. Right lung is clear. Small left pleural effusion.  IMPRESSION: Streaky atelectasis or infiltrate in left lower lobe posteriorly. No pulmonary edema. Small left pleural effusion.   Electronically Signed   By: Lahoma Crocker M.D.   On: 07/17/2015 13:04   Ct Head Wo Contrast  07/17/2015   CLINICAL DATA:  Altered mental status, syncope, weakness  EXAM: CT HEAD WITHOUT CONTRAST  TECHNIQUE: Contiguous  axial images were obtained from the base of the skull through the vertex without intravenous contrast.  COMPARISON:  12/30/2008  FINDINGS: Mild cortical  volume loss noted with proportional ventricular prominence. Areas of periventricular white matter hypodensity are most compatible with small vessel ischemic change. No acute hemorrhage, infarct, or mass lesion is identified. No midline shift. No skull fracture. Orbits and paranasal sinuses are unremarkable.  IMPRESSION: Chronic findings as above.  No acute intracranial abnormality.   Electronically Signed   By: Conchita Paris M.D.   On: 07/17/2015 14:25     Assessment/Plan: 1.  AKI/CKD- in setting of membranous GN, ARB, and diuretic therapy.  Agree with holding ARB for now and continue with cytoxan and prednisone taper.  Will decrease torsemide given her syncope and continue to follow renal function and daily weights.  Her most recent Scr was 1.55 on 07/12/15. 2. Syncope- I don't think that this was related to orthostasis as they both occurred while being seated.  Very concerning for arythmia possibly worsened by hypokalemia due to diuretic therapy.  Replete potassium and continue to monitor cardiac rhythm with telemetry and work up per primary service/cardiology. 3. Membranous GN- was prescribed cytoxan 50mg  qam  and prednisone taper.  Cannot increase cytoxan dose as she should be receiving 2mg /kg but we started low due to CKD and advanced age and now has leukopenia and anemia.  Although pt has not been receiving cytoxan for unclear reasons.  Will hold for now in light of low WBC and hgb and restart in am. 4. 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 Leukopenia- related to cytoxan.  May need to decrease dose if WBC continues to drop but on hold for now. 6. Anemia- related to combination of CKD/cytoxan.  Will check iron stores and start ESA 7. Nephrotic syndrome- decrease torsemide to 40mg  daily and follow okay to wait until tomorrow to start 8. Hypokalemia- replete and follow.   [place] Wank A 07/19/2015, 8:53 AM

## 2015-07-20 LAB — FOLATE RBC
Folate, Hemolysate: 409.9 ng/mL
Folate, RBC: 1565 ng/mL (ref >498)
Hematocrit: 26.2 % — ABNORMAL LOW (ref 34.0–46.6)

## 2015-07-20 LAB — BASIC METABOLIC PANEL
ANION GAP: 9 (ref 5–15)
BUN: 47 mg/dL — ABNORMAL HIGH (ref 6–20)
CALCIUM: 7.5 mg/dL — AB (ref 8.9–10.3)
CHLORIDE: 106 mmol/L (ref 101–111)
CO2: 26 mmol/L (ref 22–32)
Creatinine, Ser: 1.89 mg/dL — ABNORMAL HIGH (ref 0.44–1.00)
GFR calc non Af Amer: 23 mL/min — ABNORMAL LOW (ref >60)
GFR, EST AFRICAN AMERICAN: 27 mL/min — AB (ref >60)
Glucose, Bld: 116 mg/dL — ABNORMAL HIGH (ref 65–99)
Potassium: 4.4 mmol/L (ref 3.5–5.1)
Sodium: 141 mmol/L (ref 135–145)

## 2015-07-20 LAB — CBC
HEMATOCRIT: 26 % — AB (ref 36.0–46.0)
HEMOGLOBIN: 8.6 g/dL — AB (ref 12.0–15.0)
MCH: 31.2 pg (ref 26.0–34.0)
MCHC: 33.1 g/dL (ref 30.0–36.0)
MCV: 94.2 fL (ref 78.0–100.0)
Platelets: 234 10*3/uL (ref 150–400)
RBC: 2.76 MIL/uL — ABNORMAL LOW (ref 3.87–5.11)
RDW: 15.1 % (ref 11.5–15.5)
WBC: 5.3 10*3/uL (ref 4.0–10.5)

## 2015-07-20 MED ORDER — CYCLOPHOSPHAMIDE 50 MG PO TABS: 50.0000 mg | ORAL_TABLET | Freq: Every day | ORAL | Status: DC

## 2015-07-20 MED ORDER — TORSEMIDE 20 MG PO TABS: 40.0000 mg | ORAL_TABLET | Freq: Every morning | ORAL | Status: DC

## 2015-07-20 NOTE — Progress Notes (Signed)
Patient ID: Erika [name], female   DOB: 1928/03/20, 81 y.o.   MRN: [id number] S:feels better  O:BP 123/47 mmHg  Pulse 72  Temp(Src) 97.8 F (36.6 C) (Oral)  Resp 18  Ht 5\' 1"  (1.549 m)  Wt 63.192 kg (139 lb 5 oz)  BMI 26.34 kg/m2  SpO2 96%  Intake/Output Summary (Last 24 hours) at 07/20/15 1149 Last data filed at 07/20/15 0730  Gross per 24 hour  Intake    960 ml  Output      0 ml  Net    960 ml   Intake/Output: I/O last 3 completed shifts: In: 960 [P.O.:960] Out: -   Intake/Output this shift:  Total I/O In: 360 [P.O.:360] Out: -  Weight change:  Gen:WD WN WF in NAd CVS:IRR IRR no rub Resp:cta UKG:URKYHC Ext:1+ pretibial edema   Recent Labs Lab 07/17/15 1211 07/17/15 1647 07/18/15 0430 07/19/15 0452 07/20/15 0535  NA 137  --  137 136 141  K 2.9*  --  3.4* 2.8* 4.4  CL 99*  --  102 101 106  CO2 27  --  28 26 26   GLUCOSE 152*  --  108* 113* 116*  BUN 45*  --  48* 45* 47*  CREATININE 1.63* 1.63* 1.71* 1.73* 1.89*  ALBUMIN <1.0*  --   --   --   --   CALCIUM 7.8*  --  7.3* 7.1* 7.5*  PHOS 3.8  --   --   --   --   AST 25  --   --   --   --   ALT 15  --   --   --   --    Liver Function Tests:  Recent Labs Lab 07/17/15 1211  AST 25  ALT 15  ALKPHOS 62  BILITOT 0.4  PROT 4.2*  ALBUMIN <1.0*   No results for input(s): LIPASE, AMYLASE in the last 168 hours. No results for input(s): AMMONIA in the last 168 hours. CBC:  Recent Labs Lab 07/17/15 1211 07/17/15 1647 07/18/15 0430 07/20/15 0917  WBC 6.0 4.5 3.5* 5.3  NEUTROABS 5.2  --   --   --   HGB 9.7* 9.7* 8.2* 8.6*  HCT 28.9* 28.9* 24.2* 26.0*  MCV 92.9 91.5 92.0 94.2  PLT 200 204 188 234   Cardiac Enzymes:  Recent Labs Lab 07/17/15 1647 07/17/15 2230 07/18/15 0430  TROPONINI 0.08* 0.08* 0.09*   CBG:  Recent Labs Lab 07/17/15 1212  GLUCAP 134*    Iron Studies:  Recent Labs  07/19/15 1345  IRON 22*  TIBC NOT CALCULATED  FERRITIN 685*   Studies/Results: No results  found. [name] atorvastatin  20 mg Oral Daily  . cyclophosphamide  50 mg Oral Daily  . darbepoetin (ARANESP) injection - NON-DIALYSIS  40 mcg Subcutaneous Q Wed-1800  . enoxaparin (LOVENOX) injection  30 mg Subcutaneous Q24H  . ezetimibe  10 mg Oral Daily  . levothyroxine  88 mcg Oral QAC breakfast  . predniSONE  20 mg Oral Daily  . sodium chloride  500 mL Intravenous Once  . sodium chloride  3 mL Intravenous Q12H  . torsemide  40 mg Oral Daily    BMET    Component Value Date/Time   NA 141 07/20/2015 0535   K 4.4 07/20/2015 0535   CL 106 07/20/2015 0535   CO2 26 07/20/2015 0535   GLUCOSE 116* 07/20/2015 0535   BUN 47* 07/20/2015 0535   CREATININE 1.89* 07/20/2015 0535   CREATININE 0.96 02/17/2015  1812   CALCIUM 7.5* 07/20/2015 0535   GFRNONAA 23* 07/20/2015 0535   GFRAA 27* 07/20/2015 0535   CBC    Component Value Date/Time   WBC 5.3 07/20/2015 0917   WBC 7.9 02/06/2015 1116   RBC 2.76* 07/20/2015 0917   RBC 4.23 02/06/2015 1116   HGB 8.6* 07/20/2015 0917   HGB 12.1* 02/06/2015 1116   HCT 26.0* 07/20/2015 0917   HCT 32.9* 03/17/2015 1620   HCT 38.9 02/06/2015 1116   PLT 234 07/20/2015 0917   MCV 94.2 07/20/2015 0917   MCV 91.9 02/06/2015 1116   MCH 31.2 07/20/2015 0917   MCH 28.7 02/06/2015 1116   MCHC 33.1 07/20/2015 0917   MCHC 31.2* 02/06/2015 1116   RDW 15.1 07/20/2015 0917   LYMPHSABS 0.3* 07/17/2015 1211   MONOABS 0.4 07/17/2015 1211   EOSABS 0.0 07/17/2015 1211   BASOSABS 0.0 07/17/2015 1211     Assessment/Plan: 1. AKI/CKD- in setting of membranous GN, ARB, and diuretic therapy. Her most recent Scr was 1.55 on 07/12/15. 1. Agree with holding ARB for now  2. Continue with cytoxan and prednisone taper.  3. Will decrease torsemide to 40mg  daily given her syncope and continue to follow renal function and daily weights. 2. Syncope- I don't think that this was related to orthostasis as they both occurred while being seated. Very concerning for arythmia  possibly worsened by hypokalemia due to diuretic therapy. No recurrence or arhythmias while here. 3. Membranous GN- was prescribed cytoxan 50mg  qam and prednisone taper. Cannot increase cytoxan dose as she should be receiving 2mg /kg but we started low due to CKD and advanced age and now has leukopenia and anemia. Although pt has not been receiving cytoxan for unclear reasons.  1. Resume cytoxan 50mg  each morning and hold off on increasing dose depending upon WBC response. 4. 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 Leukopenia- related to cytoxan. May need to decrease dose if WBC continues to drop. 6. Anemia- related to combination of CKD/cytoxan.  1. Started aranesp 60mcg sq every 2 weeks will arrange as an outpt 7. Nephrotic syndrome- decrease torsemide to 40mg  daily and follow up as an outpt. 8. Hypokalemia- replete and follow as an outpt.  [place]ao A

## 2015-07-20 NOTE — Discharge Summary (Addendum)
Physician Discharge Summary  Erika [name] ZOX:[id number] DOB: 05/27/1928 DOA: 07/17/2015  PCP: Precious Reel, MD  Admit date: 07/17/2015 Discharge date: 07/20/2015  Time spent: 45 minutes  Recommendations for Outpatient Follow-up:  1. PCP dr.Russo in 1 week, please check Bmet at FU and fax results to PCPs office 2. Dr. Arty Baumgartner in 2 weeks   Discharge Diagnoses:  Principal Problem:   Syncope and collapse   Nephrotic syndrome    Membranous glomerulonephritis   Dehydration   AKI (acute kidney injury)   Hypokalemia   Syncope   Faintness   Discharge Condition:stable  Diet recommendation: low sodium  Filed Weights   07/17/15 1219  Weight: 63.192 kg (139 lb 5 oz)    History of present illness:  Chief Complaint: Syncope  HPI: Erika [name] is a 81 y.o. 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 While sitting down in her-she became increasingly lightheaded and then had loss of consciousness. She reported waking up on the floor.  Lab work performed in the emergency department show the presence of acute on chronic renal failure and hypokalemia with potassium of 2.9.  Hospital Course:  Syncope and collapse: - Orthostatic vitals were borderline positive, diuretics and ARB were held on admission. - she was gently hydrated and improved clinically - Echo with normal EF and grade 1 dysfunction, no wall motion abnormalities - No events on telemetry - torsemide resumed at lower dose -needs Bmet in 1 week and needs results faxed to Dr.Colodonato's office  AKI (acute kidney injury): - Baseline creatinine around 1, on admission it was 1.6-1.7, last baseline based on office labs was 1.5 - now creatinine around 1.7 - ARB stopped -  Renal consulted , has nephrotic syndrome with membranous Glomerulonephritis, has been on a prednisone taper for this and cytoxan -seen by Dr.Colodonato and now stable for discharge on torsemide 40mg  daily and needs Bmet followed up in 1 week  Hypokalemia: - Likely due to diuretic therapy,  - repleted  Consultations:  Renal  Discharge Exam: Filed Vitals:   07/20/15 0431  BP: 123/47  Pulse: 72  Temp: 97.8 F (36.6 C)  Resp: 18    General: AAOx3 Cardiovascular: S1S2/RRR Respiratory: CTAB  Discharge Instructions   Discharge Instructions    Diet - low sodium heart healthy    Complete by:  As directed      Increase activity slowly    Complete by:  As directed           Current Discharge Medication List    START taking these medications   Details  cyclophosphamide (CYTOXAN) 50 MG tablet Take 1 tablet (50 mg total) by mouth daily. Give on an empty stomach 1 hour before or 2 hours after meals.      CONTINUE these medications which have CHANGED   Details  torsemide (DEMADEX) 20 MG tablet Take 2 tablets (40 mg total) by mouth every morning. Qty: 60 tablet, Refills: 0      CONTINUE these medications which have NOT CHANGED   Details  acetaminophen (TYLENOL) 325 MG tablet Take 650 mg by mouth every 6 (six) hours as needed for mild pain or moderate pain.    atorvastatin (LIPITOR) 40 MG tablet Take 20 mg by mouth daily.    ezetimibe (ZETIA) 10 MG tablet  Take 1 tablet (10 mg total) by mouth daily. Qty: 15 tablet, Refills: 0    predniSONE (DELTASONE) 10 MG tablet Take 20 mg by mouth daily.    SYNTHROID 88 MCG tablet Take 88 mcg by mouth daily.      STOP taking these medications     losartan (COZAAR) 25 MG tablet        No Known Allergies Follow-up Information    Follow up with Precious Reel, MD. Schedule an appointment as soon as possible for a visit in 1 week.   Specialty:  Internal Medicine   Why:  please check Bmet at FU and fax results to Dr.Colodonato's  office   Contact information:   [place] [address] [phone]       Follow up with COLADONATO,Monta Police A, MD. Schedule an appointment as soon as possible for a visit in 2 weeks.   Specialty:  Nephrology   Contact information:   [place] [address] [phone]        The results of significant diagnostics from this hospitalization (including imaging, microbiology, ancillary and laboratory) are listed below for reference.    Significant Diagnostic Studies: Dg Chest 2 View  07/17/2015   CLINICAL DATA:  Syncope  EXAM: CHEST  2 VIEW  COMPARISON:  03/17/2015  FINDINGS: Cardiomediastinal silhouette is stable. There is streaky atelectasis or infiltrate left lower lobe posteriorly. No pulmonary edema. Right lung is clear. Small left pleural effusion.  IMPRESSION: Streaky atelectasis or infiltrate in left lower lobe posteriorly. No pulmonary edema. Small left pleural effusion.   Electronically Signed   By: Lahoma Crocker M.D.   On: 07/17/2015 13:04   Ct Head Wo Contrast  07/17/2015   CLINICAL DATA:  Altered mental status, syncope, weakness  EXAM: CT HEAD WITHOUT CONTRAST  TECHNIQUE: Contiguous axial images were obtained from the base of the skull through the vertex without intravenous contrast.  COMPARISON:  12/30/2008  FINDINGS: Mild cortical volume loss noted with proportional ventricular prominence. Areas of periventricular white matter hypodensity are most compatible with small vessel ischemic change. No acute hemorrhage, infarct, or mass lesion is identified. No midline shift. No skull fracture. Orbits and paranasal sinuses are unremarkable.  IMPRESSION: Chronic findings as above.  No acute intracranial abnormality.   Electronically Signed   By: Conchita Paris M.D.   On: 07/17/2015 14:25    Microbiology: No results found for this or any previous visit (from the past 240 hour(s)).   Labs: Basic Metabolic Panel:  Recent Labs Lab 07/17/15 1211  07/17/15 1647 07/18/15 0430 07/19/15 0452 07/20/15 0535  NA 137  --  137 136 141  K 2.9*  --  3.4* 2.8* 4.4  CL 99*  --  102 101 106  CO2 27  --  28 26 26   GLUCOSE 152*  --  108* 113* 116*  BUN 45*  --  48* 45* 47*  CREATININE 1.63* 1.63* 1.71* 1.73* 1.89*  CALCIUM 7.8*  --  7.3* 7.1* 7.5*  MG 1.6*  --  2.0  --   --   PHOS 3.8  --   --   --   --    Liver Function Tests:  Recent Labs Lab 07/17/15 1211  AST 25  ALT 15  ALKPHOS 62  BILITOT 0.4  PROT 4.2*  ALBUMIN <1.0*   No results for input(s): LIPASE, AMYLASE in the last 168 hours. No results for input(s): AMMONIA in the last 168 hours. CBC:  Recent Labs Lab  07/17/15 1211 07/17/15 1647 07/18/15 0430 07/20/15 0917  WBC 6.0 4.5 3.5* 5.3  NEUTROABS 5.2  --   --   --   HGB 9.7* 9.7* 8.2* 8.6*  HCT 28.9* 28.9* 24.2* 26.0*  MCV 92.9 91.5 92.0 94.2  PLT 200 204 188 234   Cardiac Enzymes:  Recent Labs Lab 07/17/15 1647 07/17/15 2230 07/18/15 0430  TROPONINI 0.08* 0.08* 0.09*   BNP: BNP (last 3 results)  Recent Labs  03/07/15 1100  BNP 163.8*    ProBNP (last 3 results) No results for input(s): PROBNP in the last 8760 hours.  CBG:  Recent Labs Lab 07/17/15 1212  GLUCAP 134*       Signed:  Reylynn Vanalstine  [facility] 07/20/2015, 12:47 PM

## 2015-07-20 NOTE — Care Management Important Message (Signed)
Important Message  Patient Details  Name: Erika [name] MRN: [id number] Date of Birth: 07-23-1928   Medicare Important Message Given:  Yes-second notification given    Nathen May 07/20/2015, 11:00 AMImportant Message  Patient Details  Name: Erika [name] MRN: [id number] Date of Birth: [birth year]   Medicare Important Message Given:  Yes-second notification given    Nathen May 07/20/2015, 11:00 AM

## 2015-08-18 ENCOUNTER — Emergency Department (HOSPITAL_COMMUNITY): Payer: Medicare Other

## 2015-08-18 ENCOUNTER — Inpatient Hospital Stay (HOSPITAL_COMMUNITY)
Admission: EM | Admit: 2015-08-18 | Discharge: 2015-08-28 | DRG: 291 | Disposition: A | Discharge status: Home / Self Care | Payer: Medicare Other | Attending: Internal Medicine | Admitting: Internal Medicine

## 2015-08-18 ENCOUNTER — Encounter (HOSPITAL_COMMUNITY): Payer: Self-pay | Admitting: *Deleted

## 2015-08-18 DIAGNOSIS — I4510 Unspecified right bundle-branch block: Secondary | ICD-10-CM | POA: Diagnosis present

## 2015-08-18 DIAGNOSIS — B951 Streptococcus, group B, as the cause of diseases classified elsewhere: Secondary | ICD-10-CM | POA: Diagnosis not present

## 2015-08-18 DIAGNOSIS — Z7952 Long term (current) use of systemic steroids: Secondary | ICD-10-CM | POA: Diagnosis not present

## 2015-08-18 DIAGNOSIS — D72819 Decreased white blood cell count, unspecified: Secondary | ICD-10-CM | POA: Diagnosis present

## 2015-08-18 DIAGNOSIS — D631 Anemia in chronic kidney disease: Secondary | ICD-10-CM | POA: Diagnosis present

## 2015-08-18 DIAGNOSIS — N049 Nephrotic syndrome with unspecified morphologic changes: Secondary | ICD-10-CM | POA: Diagnosis present

## 2015-08-18 DIAGNOSIS — E039 Hypothyroidism, unspecified: Secondary | ICD-10-CM | POA: Diagnosis present

## 2015-08-18 DIAGNOSIS — I2510 Atherosclerotic heart disease of native coronary artery without angina pectoris: Secondary | ICD-10-CM | POA: Diagnosis present

## 2015-08-18 DIAGNOSIS — T451X5A Adverse effect of antineoplastic and immunosuppressive drugs, initial encounter: Secondary | ICD-10-CM | POA: Diagnosis present

## 2015-08-18 DIAGNOSIS — R933 Abnormal findings on diagnostic imaging of other parts of digestive tract: Secondary | ICD-10-CM | POA: Diagnosis not present

## 2015-08-18 DIAGNOSIS — N390 Urinary tract infection, site not specified: Secondary | ICD-10-CM | POA: Diagnosis not present

## 2015-08-18 DIAGNOSIS — Z6827 Body mass index (BMI) 27.0-27.9, adult: Secondary | ICD-10-CM

## 2015-08-18 DIAGNOSIS — E872 Acidosis: Secondary | ICD-10-CM | POA: Diagnosis not present

## 2015-08-18 DIAGNOSIS — E440 Moderate protein-calorie malnutrition: Secondary | ICD-10-CM | POA: Diagnosis present

## 2015-08-18 DIAGNOSIS — I5033 Acute on chronic diastolic (congestive) heart failure: Secondary | ICD-10-CM | POA: Diagnosis present

## 2015-08-18 DIAGNOSIS — I248 Other forms of acute ischemic heart disease: Secondary | ICD-10-CM | POA: Diagnosis present

## 2015-08-18 DIAGNOSIS — R000 Tachycardia, unspecified: Secondary | ICD-10-CM

## 2015-08-18 DIAGNOSIS — E785 Hyperlipidemia, unspecified: Secondary | ICD-10-CM | POA: Diagnosis present

## 2015-08-18 DIAGNOSIS — R7989 Other specified abnormal findings of blood chemistry: Secondary | ICD-10-CM

## 2015-08-18 DIAGNOSIS — I130 Hypertensive heart and chronic kidney disease with heart failure and stage 1 through stage 4 chronic kidney disease, or unspecified chronic kidney disease: Secondary | ICD-10-CM | POA: Diagnosis present

## 2015-08-18 DIAGNOSIS — A408 Other streptococcal sepsis: Secondary | ICD-10-CM | POA: Diagnosis not present

## 2015-08-18 DIAGNOSIS — J9601 Acute respiratory failure with hypoxia: Secondary | ICD-10-CM | POA: Diagnosis not present

## 2015-08-18 DIAGNOSIS — R935 Abnormal findings on diagnostic imaging of other abdominal regions, including retroperitoneum: Secondary | ICD-10-CM | POA: Diagnosis not present

## 2015-08-18 DIAGNOSIS — A409 Streptococcal sepsis, unspecified: Secondary | ICD-10-CM | POA: Diagnosis not present

## 2015-08-18 DIAGNOSIS — I252 Old myocardial infarction: Secondary | ICD-10-CM | POA: Diagnosis not present

## 2015-08-18 DIAGNOSIS — I509 Heart failure, unspecified: Secondary | ICD-10-CM

## 2015-08-18 DIAGNOSIS — R601 Generalized edema: Secondary | ICD-10-CM | POA: Diagnosis not present

## 2015-08-18 DIAGNOSIS — N179 Acute kidney failure, unspecified: Secondary | ICD-10-CM | POA: Diagnosis present

## 2015-08-18 DIAGNOSIS — E669 Obesity, unspecified: Secondary | ICD-10-CM | POA: Diagnosis present

## 2015-08-18 DIAGNOSIS — D638 Anemia in other chronic diseases classified elsewhere: Secondary | ICD-10-CM | POA: Diagnosis present

## 2015-08-18 DIAGNOSIS — E876 Hypokalemia: Secondary | ICD-10-CM | POA: Diagnosis present

## 2015-08-18 DIAGNOSIS — Z79899 Other long term (current) drug therapy: Secondary | ICD-10-CM

## 2015-08-18 DIAGNOSIS — N184 Chronic kidney disease, stage 4 (severe): Secondary | ICD-10-CM

## 2015-08-18 DIAGNOSIS — A419 Sepsis, unspecified organism: Secondary | ICD-10-CM | POA: Diagnosis not present

## 2015-08-18 DIAGNOSIS — R509 Fever, unspecified: Secondary | ICD-10-CM | POA: Diagnosis not present

## 2015-08-18 DIAGNOSIS — E038 Other specified hypothyroidism: Secondary | ICD-10-CM | POA: Diagnosis not present

## 2015-08-18 HISTORY — DX: Tachycardia, unspecified: R00.0

## 2015-08-18 HISTORY — DX: Acute on chronic diastolic (congestive) heart failure: I50.33

## 2015-08-18 LAB — CBC WITH DIFFERENTIAL/PLATELET
BASOS ABS: 0 10*3/uL (ref 0.0–0.1)
BASOS PCT: 0 %
EOS ABS: 0 10*3/uL (ref 0.0–0.7)
Eosinophils Relative: 0 %
HEMATOCRIT: 26.4 % — AB (ref 36.0–46.0)
Hemoglobin: 8.3 g/dL — ABNORMAL LOW (ref 12.0–15.0)
Lymphocytes Relative: 4 %
Lymphs Abs: 0.2 10*3/uL — ABNORMAL LOW (ref 0.7–4.0)
MCH: 29.7 pg (ref 26.0–34.0)
MCHC: 31.4 g/dL (ref 30.0–36.0)
MCV: 94.6 fL (ref 78.0–100.0)
MONO ABS: 0.2 10*3/uL (ref 0.1–1.0)
MONOS PCT: 6 %
NEUTROS ABS: 3.5 10*3/uL (ref 1.7–7.7)
NEUTROS PCT: 90 %
Platelets: 249 10*3/uL (ref 150–400)
RBC: 2.79 MIL/uL — ABNORMAL LOW (ref 3.87–5.11)
RDW: 15.9 % — AB (ref 11.5–15.5)
WBC: 3.9 10*3/uL — ABNORMAL LOW (ref 4.0–10.5)

## 2015-08-18 LAB — BASIC METABOLIC PANEL
ANION GAP: 8 (ref 5–15)
BUN: 39 mg/dL — ABNORMAL HIGH (ref 6–20)
CALCIUM: 7.5 mg/dL — AB (ref 8.9–10.3)
CO2: 23 mmol/L (ref 22–32)
CREATININE: 2.06 mg/dL — AB (ref 0.44–1.00)
Chloride: 107 mmol/L (ref 101–111)
GFR, EST AFRICAN AMERICAN: 24 mL/min — AB (ref >60)
GFR, EST NON AFRICAN AMERICAN: 21 mL/min — AB (ref >60)
Glucose, Bld: 191 mg/dL — ABNORMAL HIGH (ref 65–99)
Potassium: 3.4 mmol/L — ABNORMAL LOW (ref 3.5–5.1)
SODIUM: 138 mmol/L (ref 135–145)

## 2015-08-18 LAB — URINALYSIS, ROUTINE W REFLEX MICROSCOPIC
BILIRUBIN URINE: NEGATIVE
Ketones, ur: NEGATIVE mg/dL
Leukocytes, UA: NEGATIVE
Nitrite: NEGATIVE
PH: 6 (ref 5.0–8.0)
Protein, ur: >300 mg/dL — AB
SPECIFIC GRAVITY, URINE: 1.025 (ref 1.005–1.030)
Urobilinogen, UA: 0.2 mg/dL (ref 0.0–1.0)

## 2015-08-18 LAB — BRAIN NATRIURETIC PEPTIDE: B NATRIURETIC PEPTIDE 5: 270.4 pg/mL — AB (ref 0.0–100.0)

## 2015-08-18 LAB — URINE MICROSCOPIC-ADD ON

## 2015-08-18 LAB — HEPATIC FUNCTION PANEL
ALT: 15 U/L (ref 14–54)
AST: 27 U/L (ref 15–41)
Alkaline Phosphatase: 52 U/L (ref 38–126)
BILIRUBIN TOTAL: 0.3 mg/dL (ref 0.3–1.2)
Bilirubin, Direct: <0.1 mg/dL — ABNORMAL LOW (ref 0.1–0.5)
Total Protein: 3.8 g/dL — ABNORMAL LOW (ref 6.5–8.1)

## 2015-08-18 LAB — TROPONIN I: TROPONIN I: 0.23 ng/mL — AB (ref <0.031)

## 2015-08-18 LAB — I-STAT TROPONIN, ED: TROPONIN I, POC: 0.24 ng/mL — AB (ref 0.00–0.08)

## 2015-08-18 LAB — TSH: TSH: 1.855 u[IU]/mL (ref 0.350–4.500)

## 2015-08-18 MED ORDER — ASPIRIN EC 81 MG PO TBEC
81.0000 mg | DELAYED_RELEASE_TABLET | Freq: Every day | ORAL | Status: DC
  Administered 2015-08-19 – 2015-08-28 (×10): 81 mg via ORAL
  Filled 2015-08-18 (×10): qty 1

## 2015-08-18 MED ORDER — ACETAMINOPHEN 325 MG PO TABS
650.0000 mg | ORAL_TABLET | ORAL | Status: DC | PRN
  Administered 2015-08-19 – 2015-08-28 (×7): 650 mg via ORAL
  Filled 2015-08-18 (×7): qty 2

## 2015-08-18 MED ORDER — LEVOTHYROXINE SODIUM 75 MCG PO TABS
75.0000 ug | ORAL_TABLET | Freq: Every day | ORAL | Status: DC
  Administered 2015-08-20 – 2015-08-28 (×9): 75 ug via ORAL
  Filled 2015-08-18 (×9): qty 1

## 2015-08-18 MED ORDER — ASPIRIN 81 MG PO CHEW
324.0000 mg | CHEWABLE_TABLET | Freq: Once | ORAL | Status: AC
Start: 2015-08-18 — End: 2015-08-18
  Administered 2015-08-18: 324 mg via ORAL
  Filled 2015-08-18: qty 4

## 2015-08-18 MED ORDER — SODIUM CHLORIDE 0.9 % IJ SOLN: 3.0000 mL | INTRAMUSCULAR | Status: DC | PRN

## 2015-08-18 MED ORDER — ATORVASTATIN CALCIUM 20 MG PO TABS
20.0000 mg | ORAL_TABLET | Freq: Every day | ORAL | Status: DC
  Administered 2015-08-19 – 2015-08-28 (×10): 20 mg via ORAL
  Filled 2015-08-18 (×10): qty 1

## 2015-08-18 MED ORDER — PREDNISONE 10 MG PO TABS
10.0000 mg | ORAL_TABLET | ORAL | Status: DC
  Administered 2015-08-20 – 2015-08-28 (×5): 10 mg via ORAL
  Filled 2015-08-18 (×7): qty 1

## 2015-08-18 MED ORDER — ACETAMINOPHEN 325 MG PO TABS: 650.0000 mg | ORAL_TABLET | Freq: Four times a day (QID) | ORAL | Status: DC | PRN

## 2015-08-18 MED ORDER — FUROSEMIDE 10 MG/ML IJ SOLN
40.0000 mg | Freq: Two times a day (BID) | INTRAMUSCULAR | Status: DC
  Administered 2015-08-18 – 2015-08-20 (×4): 40 mg via INTRAVENOUS
  Filled 2015-08-18 (×4): qty 4

## 2015-08-18 MED ORDER — EZETIMIBE 10 MG PO TABS
10.0000 mg | ORAL_TABLET | Freq: Every day | ORAL | Status: DC
Start: 2015-08-19 — End: 2015-08-28
  Administered 2015-08-19 – 2015-08-28 (×10): 10 mg via ORAL
  Filled 2015-08-18 (×10): qty 1

## 2015-08-18 MED ORDER — CYCLOPHOSPHAMIDE 50 MG PO TABS
50.0000 mg | ORAL_TABLET | Freq: Every day | ORAL | Status: DC
  Administered 2015-08-19 – 2015-08-22 (×4): 50 mg via ORAL
  Filled 2015-08-18 (×4): qty 1

## 2015-08-18 MED ORDER — ONDANSETRON HCL 4 MG/2ML IJ SOLN: 4.0000 mg | Freq: Four times a day (QID) | INTRAMUSCULAR | Status: DC | PRN

## 2015-08-18 MED ORDER — SODIUM CHLORIDE 0.9 % IV SOLN
250.0000 mL | INTRAVENOUS | Status: DC | PRN
  Administered 2015-08-21: 250 mL via INTRAVENOUS

## 2015-08-18 MED ORDER — SODIUM CHLORIDE 0.9 % IJ SOLN
3.0000 mL | Freq: Two times a day (BID) | INTRAMUSCULAR | Status: DC
  Administered 2015-08-18 – 2015-08-28 (×18): 3 mL via INTRAVENOUS

## 2015-08-18 MED ORDER — POTASSIUM CHLORIDE CRYS ER 20 MEQ PO TBCR
30.0000 meq | EXTENDED_RELEASE_TABLET | Freq: Once | ORAL | Status: AC
  Administered 2015-08-18: 30 meq via ORAL
  Filled 2015-08-18: qty 2

## 2015-08-18 MED ORDER — HEPARIN SODIUM (PORCINE) 5000 UNIT/ML IJ SOLN
5000.0000 [IU] | Freq: Three times a day (TID) | INTRAMUSCULAR | Status: DC
  Administered 2015-08-18 – 2015-08-28 (×29): 5000 [IU] via SUBCUTANEOUS
  Filled 2015-08-18 (×27): qty 1

## 2015-08-18 NOTE — Progress Notes (Signed)
   08/18/15 2219  Vitals  Temp 98.6 F (37 C)  Temp Source Oral  BP (!) 154/82 mmHg  BP Location Left Arm  BP Method Automatic  Patient Position (if appropriate) Lying  Pulse Rate 93  Resp (!) 28  Oxygen Therapy  SpO2 100 %  O2 Device Nasal Cannula  O2 Flow Rate (L/min) 2 L/min  Admitted pt to rm 3E19 from ED, pt oriented to room,, call bell placed within reach,orders carried out. Will monitor.

## 2015-08-18 NOTE — ED Notes (Signed)
Pt reports being in bed today and noticing that her hr was 156. Pt states that she has hx of irregular heartbeat. Pt in NAD in triage. Pt alert and oriented.

## 2015-08-18 NOTE — ED Notes (Signed)
Patient transported to X-ray 

## 2015-08-18 NOTE — ED Notes (Signed)
PA Lambert Keto to perform rectal exam with this RN

## 2015-08-18 NOTE — ED Notes (Signed)
Phlebotomy at bedside.

## 2015-08-18 NOTE — ED Provider Notes (Signed)
CSN: [id number]     Arrival date & time 08/18/15  1700 History   First MD Initiated Contact with Patient 08/18/15 1707     Chief Complaint  Patient presents with  . Tachycardia     HPI   Erika [name] is a 81 y.o. female with a PMH of HTN, HLD, CAD, MI who presents to the ED with tachycardia. She reports she regularly checks her BP at home, and states that over the past few days, her HR has been elevated. She reports she thinks this is because the dose of her thyroid medicine was recently increased. She states today her HR was elevated to the 150s, and she felt tired and stayed in bed all day. She denies the sensation of palpitations, buts states she can tell when her heart is racing by how she feels. She states this sensation has been constant. She is unable to identify anything that exacerbates or alleviates her symptoms. She states this has never happened to her before. She denies fever, chills, headache, lightheadedness, dizziness, loss of consciousness. She reports shortness of breath, which she states she has at baseline. She denies chest pain, abdominal pain, N/V/C/D, dysuria, urgency, frequency.   Past Medical History  Diagnosis Date  . Hypertension   . Hyperlipidemia   . Myocardial infarction 1990  . CAD (coronary artery disease)     post MI in 68  . Osteopenia   . Hypothyroidism   . Fall Sept 2012    with left zygoma fracture  . Normal nuclear stress test July 2012    No ischemia.   . Right bundle branch block   . Tachycardia- per BP machine 08/18/2015  . Acute on chronic diastolic HF (heart failure) 08/18/2015   Past Surgical History  Procedure Laterality Date  . Cardiac catheterization  10/18/1994    Single vessel disease with a severe stenosis in the RCA which had recannalized since prior study in 1990. Managed medically  . Cardiac catheterization  09/22/89  . Cataract extraction, bilateral  10/2009   Family History  Problem Relation Age of Onset  . Heart attack  Father 83  . Heart attack Mother 63  . Heart disease Brother   . Heart disease Brother    Social History  Substance Use Topics  . Smoking status: Never Smoker   . Smokeless tobacco: Never Used  . Alcohol Use: No   OB History    No data available      Review of Systems  Constitutional: Positive for activity change and fatigue. Negative for fever, chills and appetite change.  Respiratory: Positive for shortness of breath.   Cardiovascular: Positive for leg swelling. Negative for chest pain and palpitations.  Gastrointestinal: Positive for abdominal distention. Negative for nausea, vomiting, abdominal pain, diarrhea and constipation.       Reports abdominal distention, which she states is unchanged from baseline.  Genitourinary: Negative for dysuria, urgency and frequency.  Neurological: Negative for dizziness, syncope, weakness, light-headedness, numbness and headaches.  All other systems reviewed and are negative.     Allergies  Review of patient's allergies indicates no known allergies.  Home Medications   Prior to Admission medications   Medication Sig Start Date End Date Taking? Authorizing Provider  acetaminophen (TYLENOL) 325 MG tablet Take 650 mg by mouth every 6 (six) hours as needed for mild pain or moderate pain.   Yes Historical Provider, MD  atorvastatin (LIPITOR) 40 MG tablet Take 20 mg by mouth daily. 07/04/15  Yes Historical  Provider, MD  cyclophosphamide (CYTOXAN) 50 MG tablet Take 1 tablet (50 mg total) by mouth daily. Give on an empty stomach 1 hour before or 2 hours after meals. 07/20/15  Yes Domenic Polite, MD  ezetimibe (ZETIA) 10 MG tablet Take 1 tablet (10 mg total) by mouth daily. 10/30/12  Yes Burtis Junes, NP  levothyroxine (SYNTHROID, LEVOTHROID) 100 MCG tablet Take 100 mcg by mouth daily before breakfast.   Yes Historical Provider, MD  metolazone (ZAROXOLYN) 2.5 MG tablet Take 2.5 mg by mouth every Monday, Wednesday, and Friday.   Yes Historical  Provider, MD  predniSONE (DELTASONE) 10 MG tablet Take 10 mg by mouth every other day.    Yes Historical Provider, MD  torsemide (DEMADEX) 100 MG tablet Take 100 mg by mouth daily.   Yes Historical Provider, MD    BP 126/70 mmHg  Pulse 99  Temp(Src) 98.5 F (36.9 C) (Oral)  Resp 27  Ht 5' (1.524 m)  Wt 143 lb (64.864 kg)  BMI 27.93 kg/m2  SpO2 95% Physical Exam  Constitutional: She is oriented to person, place, and time. She appears well-developed and well-nourished. No distress.  HENT:  Head: Normocephalic and atraumatic.  Right Ear: External ear normal.  Left Ear: External ear normal.  Nose: Nose normal.  Mouth/Throat: Uvula is midline, oropharynx is clear and moist and mucous membranes are normal.  Eyes: Conjunctivae, EOM and lids are normal. Pupils are equal, round, and reactive to light. Right eye exhibits no discharge. Left eye exhibits no discharge. No scleral icterus.  Neck: Normal range of motion. Neck supple.  Cardiovascular: Regular rhythm, normal heart sounds, intact distal pulses and normal pulses.  Tachycardia present.   Mildly tachycardic.  Pulmonary/Chest: Effort normal and breath sounds normal. No respiratory distress. She has no wheezes. She has no rales.  Abdominal: Soft. Normal appearance and bowel sounds are normal. She exhibits distension. She exhibits no mass. There is no tenderness. There is no rigidity, no rebound and no guarding.  Mild abdominal distention. No TTP, rebound, guarding, or masses.  Musculoskeletal: Normal range of motion. She exhibits edema. She exhibits no tenderness.  1+ pitting edema to lower extremities bilaterally.  Neurological: She is alert and oriented to person, place, and time.  Skin: Skin is warm, dry and intact. No rash noted. She is not diaphoretic. No erythema. No pallor.  Psychiatric: She has a normal mood and affect. Her speech is normal and behavior is normal. Judgment and thought content normal.  Nursing note and vitals  reviewed.   ED Course  Procedures (including critical care time)  Labs Review Labs Reviewed  CBC WITH DIFFERENTIAL/PLATELET - Abnormal; Notable for the following:    WBC 3.9 (*)    RBC 2.79 (*)    Hemoglobin 8.3 (*)    HCT 26.4 (*)    RDW 15.9 (*)    Lymphs Abs 0.2 (*)    All other components within normal limits  BASIC METABOLIC PANEL - Abnormal; Notable for the following:    Potassium 3.4 (*)    Glucose, Bld 191 (*)    BUN 39 (*)    Creatinine, Ser 2.06 (*)    Calcium 7.5 (*)    GFR calc non Af Amer 21 (*)    GFR calc Af Amer 24 (*)    All other components within normal limits  BRAIN NATRIURETIC PEPTIDE - Abnormal; Notable for the following:    B Natriuretic Peptide 270.4 (*)    All other components within normal limits  URINALYSIS, ROUTINE W REFLEX MICROSCOPIC (NOT AT [facility]) - Abnormal; Notable for the following:    APPearance CLOUDY (*)    Glucose, UA >1000 (*)    Hgb urine dipstick MODERATE (*)    Protein, ur >300 (*)    All other components within normal limits  URINE MICROSCOPIC-ADD ON - Abnormal; Notable for the following:    Squamous Epithelial / LPF MANY (*)    Casts HYALINE CASTS (*)    All other components within normal limits  I-STAT TROPOININ, ED - Abnormal; Notable for the following:    Troponin i, poc 0.24 (*)    All other components within normal limits  TSH  TROPONIN I  TROPONIN I  TROPONIN I  HEPATIC FUNCTION PANEL    Imaging Review Dg Chest 2 View  08/18/2015   CLINICAL DATA:  Shortness of breath.  Symptoms today.  EXAM: CHEST  2 VIEW  COMPARISON:  07/17/2015  FINDINGS: Linear atelectasis in the lingula. Right lung is clear. Heart is upper limits normal in size. No effusions. No acute bony abnormality.  IMPRESSION: Lingular atelectasis.   Electronically Signed   By: Rolm Baptise M.D.   On: 08/18/2015 18:08     I have personally reviewed and evaluated these images and lab results as part of my medical decision-making.   EKG  Interpretation   Date/Time:  Friday August 18 2015 17:11:30 EDT Ventricular Rate:  102 PR Interval:  125 QRS Duration: 132 QT Interval:  368 QTC Calculation: 479 R Axis:   -66 Text Interpretation:  Sinus tachycardia Right bundle branch block  Inferolateral infarct, old No significant change since last tracing other  than tachycardia Confirmed by Lonia Skinner ([id number]) on 08/18/2015 5:16:04  PM      MDM   Final diagnoses:  Tachycardia    81 year old female presents with tachycardia. Denies palpitations, chest pain. Reports shortness of breath at baseline. Denies headache, lightheadedness, dizziness, loss of consciousness.   Patient is afebrile. Patient mildly tachycardic and tachypneic. Heart regular rhythm. Lungs clear to auscultation bilaterally. Abdomen soft, non-tender, non-distended. 1+ pitting edema to lower extremities bilaterally.  EKG with sinus tachycardia, right bundle branch block. Troponin 0.24. CBC negative for leukocytosis. Hemoglobin 8.3, appears chronic and stable. BMP with potassium 3.4, creatinine 2.06, mildly elevated from baseline.  BNP elevated at 270.4. TSH within normal limits. UA with hemoglobin and protein, no evidence of infection. CXR with lingular atelectasis.   Patient given ASA in the ED. Cardiology consulted. Spoke with Dr. Meda Coffee, who will see the patient in the ED. Patient to be admitted to cardiology.  BP 126/70 mmHg  Pulse 99  Temp(Src) 98.5 F (36.9 C) (Oral)  Resp 27  Ht 5' (1.524 m)  Wt 143 lb (64.864 kg)  BMI 27.93 kg/m2  SpO2 95%        Marella Chimes, PA-C 08/18/15 2112  Harvel Quale, MD 08/19/15 [phone]

## 2015-08-18 NOTE — H&P (Signed)
Erika [name] is an 81 y.o. female.    Primary Cardiologist:Dr. Wynonia Lawman  PCP: Precious Reel, MD  Chief Complaint: elevated HR  HPI:  81 year old female with hx of CAD with MI in 1990 last cath 1995 with Single vessel disease with a severe stenosis in the RCA which had recannalized since prior study in 1990. Managed medically.  Other hx HTN Hyperlipidemia hypothyroidism.  Las Nuc 2012 was normal.  Recent hospitalization for syncope she has dehydration and nephrotic syndrome (previous biopsy Membranous nephropathy)  and hypokalemia.   Most recent echo with EF 60-65% G1DD. sclerotic aortic valve with trace AI; moderate TR with mildly elevated pulomonary pressure; thickend atrial septum most likely secondary to lipomatous hypertrophy.   Today wt up 13 lbs dry wt is 132 and now 146.  EKG with SR on admit RBBB and low voltage in lat. Leads.  Her H/H 8.3/26, cr elevated at 2.06.  Troponin elevated 0.24.  This is more elevated than in August.   Her abd has fluid and some lower ext edema.  No bloody or dark stools.  She stated on her BP cuff it read HR of 156.  She did not feel racing HR.  No chest pain. + sob.  Past Medical History  Diagnosis Date  . Hypertension   . Hyperlipidemia   . Myocardial infarction 1990  . CAD (coronary artery disease)     post MI in 45  . Osteopenia   . Hypothyroidism   . Fall Sept 2012    with left zygoma fracture  . Normal nuclear stress test July 2012    No ischemia.   . Right bundle branch block     Past Surgical History  Procedure Laterality Date  . Cardiac catheterization  10/18/1994    Single vessel disease with a severe stenosis in the RCA which had recannalized since prior study in 1990. Managed medically  . Cardiac catheterization  09/22/89  . Cataract extraction, bilateral  10/2009    Family History  Problem Relation Age of Onset  . Heart attack Father 66  . Heart attack Mother 60  . Heart disease Brother   . Heart disease  Brother    Social History:  reports that she has never smoked. She has never used smokeless tobacco. She reports that she does not drink alcohol or use illicit drugs.  Allergies: No Known Allergies  OUTPATIENT MEDICATIONS: No current facility-administered medications on file prior to encounter.   Current Outpatient Prescriptions on File Prior to Encounter  Medication Sig Dispense Refill  . acetaminophen (TYLENOL) 325 MG tablet Take 650 mg by mouth every 6 (six) hours as needed for mild pain or moderate pain.    [name] atorvastatin (LIPITOR) 40 MG tablet Take 20 mg by mouth daily.    . cyclophosphamide (CYTOXAN) 50 MG tablet Take 1 tablet (50 mg total) by mouth daily. Give on an empty stomach 1 hour before or 2 hours after meals.    [name] ezetimibe (ZETIA) 10 MG tablet Take 1 tablet (10 mg total) by mouth daily. 15 tablet 0  . predniSONE (DELTASONE) 10 MG tablet Take 10 mg by mouth every other day.        Results for orders placed or performed during the hospital encounter of 08/18/15 (from the past 48 hour(s))  CBC with Differential     Status: Abnormal   Collection Time: 08/18/15  6:10 PM  Result Value Ref Range   WBC  3.9 (L) 4.0 - 10.5 K/uL   RBC 2.79 (L) 3.87 - 5.11 MIL/uL   Hemoglobin 8.3 (L) 12.0 - 15.0 g/dL   HCT 26.4 (L) 36.0 - 46.0 %   MCV 94.6 78.0 - 100.0 fL   MCH 29.7 26.0 - 34.0 pg   MCHC 31.4 30.0 - 36.0 g/dL   RDW 15.9 (H) 11.5 - 15.5 %   Platelets 249 150 - 400 K/uL   Neutrophils Relative % 90 %   Neutro Abs 3.5 1.7 - 7.7 K/uL   Lymphocytes Relative 4 %   Lymphs Abs 0.2 (L) 0.7 - 4.0 K/uL   Monocytes Relative 6 %   Monocytes Absolute 0.2 0.1 - 1.0 K/uL   Eosinophils Relative 0 %   Eosinophils Absolute 0.0 0.0 - 0.7 K/uL   Basophils Relative 0 %   Basophils Absolute 0.0 0.0 - 0.1 K/uL  Basic metabolic panel     Status: Abnormal   Collection Time: 08/18/15  6:10 PM  Result Value Ref Range   Sodium 138 135 - 145 mmol/L   Potassium 3.4 (L) 3.5 - 5.1 mmol/L   Chloride  107 101 - 111 mmol/L   CO2 23 22 - 32 mmol/L   Glucose, Bld 191 (H) 65 - 99 mg/dL   BUN 39 (H) 6 - 20 mg/dL   Creatinine, Ser 2.06 (H) 0.44 - 1.00 mg/dL   Calcium 7.5 (L) 8.9 - 10.3 mg/dL   GFR calc non Af Amer 21 (L) >60 mL/min   GFR calc Af Amer 24 (L) >60 mL/min    Comment: (NOTE) The eGFR has been calculated using the CKD EPI equation. This calculation has not been validated in all clinical situations. eGFR's persistently <60 mL/min signify possible Chronic Kidney Disease.    Anion gap 8 5 - 15  Brain natriuretic peptide     Status: Abnormal   Collection Time: 08/18/15  6:10 PM  Result Value Ref Range   B Natriuretic Peptide 270.4 (H) 0.0 - 100.0 pg/mL  TSH     Status: None   Collection Time: 08/18/15  6:10 PM  Result Value Ref Range   TSH 1.855 0.350 - 4.500 uIU/mL  I-stat troponin, ED     Status: Abnormal   Collection Time: 08/18/15  6:12 PM  Result Value Ref Range   Troponin i, poc 0.24 (HH) 0.00 - 0.08 ng/mL   Comment NOTIFIED PHYSICIAN    Comment 3            Comment: Due to the release kinetics of cTnI, a negative result within the first hours of the onset of symptoms does not rule out myocardial infarction with certainty. If myocardial infarction is still suspected, repeat the test at appropriate intervals.    Dg Chest 2 View  08/18/2015   CLINICAL DATA:  Shortness of breath.  Symptoms today.  EXAM: CHEST  2 VIEW  COMPARISON:  07/17/2015  FINDINGS: Linear atelectasis in the lingula. Right lung is clear. Heart is upper limits normal in size. No effusions. No acute bony abnormality.  IMPRESSION: Lingular atelectasis.   Electronically Signed   By: Rolm Baptise M.D.   On: 08/18/2015 18:08    ROS: General:no colds or fevers, increase of weight by 13 lbs Skin:no rashes or ulcers HEENT:no blurred vision, no congestion CV:see HPI PUL:see HPI GI:no diarrhea constipation or melena, no indigestion GU:no hematuria, no dysuria MS:no joint pain, no claudication Neuro:no  syncope, no lightheadedness- no syncope since last admit Endo:no diabetes, + thyroid disease  she felt increased thyroid med was causing increased HR so she stopped taking it.  Was at 138mcg,    Blood pressure 126/70, pulse 99, temperature 98.5 F (36.9 C), temperature source Oral, resp. rate 27, height 5' (1.524 m), weight 143 lb (64.864 kg), SpO2 95 %. PE: . General:Pleasant affect, NAD Skin:Warm and dry, brisk capillary refill HEENT:normocephalic, sclera clear, mucus membranes moist Neck:supple, + JVD, no bruits  Heart:S1S2 RRR without murmur, gallup, rub or click Lungs:clear without rales, rhonchi, or wheezes TRR:NHAF, non tender, + BS, + fluid do not palpate liver spleen or masses Ext:2+ lower ext edema, 2+ pedal pulses, 2+ radial pulses Neuro:alert and oriented X 3, MAE, follows commands, + facial symmetry   Assessment/Plan Principal Problem:   Acute on chronic diastolic HF (heart failure)- will give IV lasix 40 BID- monitor closely, admit  Active Problems:   Elevated troponins demand ischemia vs. NSTEMI will do serial troponins- hold BB to see if tachycardic.  No ACE due to renal insuff   CAD (coronary artery disease) hx of MI 1990   Hypothyroidism-labs 01/2015. She stopped her synthroid 3 days ago thought this was contributing to her fast HR.  She has agreed to resume at 75 mcg TSH today 1.85   anemia may be of chronic disease - will add heparin VTE proph. If troponins increase she will need IV heparin.  Iron was 22 1 month ago    Anasarca   AKI (acute kidney injury) cr elevated today recheck with IV lasix    Tachycardia- per BP machine- here SR will monitor  [facility] R Nurse Practitioner Certified [place] Pager [phone] or after 5pm or weekends call [phone] 08/18/2015, 7:44 PM  The patient was seen, examined and discussed with Cecilie Kicks, NP and I agree with the above.   81 year old patient of Dr Wynonia Lawman, with h/o CAD, the last cath in 1995  with SVD in RCA, nephrotic syndrome with CKD stage III who presented with worsening LE edema, increased abdominal girth and increased weight when compared ti her baseline 132-->146 lbs. Physical exam shows LE edema and ascites, minimal crackles at lung bases consistent with acute on chronic diastolic CHF. We will start lasix 40 mg iv BID. She has minimally elevated troponin 0.24--0.23 sec to CHF in the settings of CKD. The patient has no chest pain and Hb 8.3, we will not start Heparin. Monitor Crea closely, start carvedilol 3.125 mg po BID for tachycardia.  Dorothy Spark 08/19/2015

## 2015-08-18 NOTE — ED Notes (Signed)
Pt given [place] sandwich and drink

## 2015-08-19 LAB — BASIC METABOLIC PANEL
ANION GAP: 8 (ref 5–15)
BUN: 39 mg/dL — ABNORMAL HIGH (ref 6–20)
CALCIUM: 7.4 mg/dL — AB (ref 8.9–10.3)
CO2: 22 mmol/L (ref 22–32)
CREATININE: 1.96 mg/dL — AB (ref 0.44–1.00)
Chloride: 108 mmol/L (ref 101–111)
GFR, EST AFRICAN AMERICAN: 26 mL/min — AB (ref >60)
GFR, EST NON AFRICAN AMERICAN: 22 mL/min — AB (ref >60)
Glucose, Bld: 110 mg/dL — ABNORMAL HIGH (ref 65–99)
Potassium: 3.4 mmol/L — ABNORMAL LOW (ref 3.5–5.1)
SODIUM: 138 mmol/L (ref 135–145)

## 2015-08-19 LAB — CBC
HCT: 23.4 % — ABNORMAL LOW (ref 36.0–46.0)
HEMOGLOBIN: 7.5 g/dL — AB (ref 12.0–15.0)
MCH: 30.4 pg (ref 26.0–34.0)
MCHC: 32.1 g/dL (ref 30.0–36.0)
MCV: 94.7 fL (ref 78.0–100.0)
PLATELETS: 226 10*3/uL (ref 150–400)
RBC: 2.47 MIL/uL — ABNORMAL LOW (ref 3.87–5.11)
RDW: 15.9 % — AB (ref 11.5–15.5)
WBC: 3.7 10*3/uL — ABNORMAL LOW (ref 4.0–10.5)

## 2015-08-19 LAB — TROPONIN I
TROPONIN I: 0.21 ng/mL — AB (ref <0.031)
Troponin I: 0.23 ng/mL — ABNORMAL HIGH (ref <0.031)

## 2015-08-19 MED ORDER — CARVEDILOL 3.125 MG PO TABS
3.1250 mg | ORAL_TABLET | Freq: Two times a day (BID) | ORAL | Status: DC
  Administered 2015-08-19 – 2015-08-20 (×4): 3.125 mg via ORAL
  Filled 2015-08-19 (×4): qty 1

## 2015-08-19 MED ORDER — POTASSIUM CHLORIDE CRYS ER 20 MEQ PO TBCR
40.0000 meq | EXTENDED_RELEASE_TABLET | Freq: Once | ORAL | Status: AC
  Administered 2015-08-19: 40 meq via ORAL
  Filled 2015-08-19: qty 2

## 2015-08-19 NOTE — Progress Notes (Signed)
Patient ID: Erika [name], female   DOB: 06/01/1928, 81 y.o.   MRN: [id number]     Subjective:    +SOB  Objective:   Temp:  [98.5 F (36.9 C)-98.6 F (37 C)] 98.6 F (37 C) (09/24 0537) Pulse Rate:  [80-105] 92 (09/24 1000) Resp:  [16-34] 20 (09/24 0537) BP: (117-154)/(50-96) 140/51 mmHg (09/24 1000) SpO2:  [91 %-100 %] 91 % (09/24 1000) Weight:  [143 lb (64.864 kg)-143 lb 12.8 oz (65.227 kg)] 143 lb 11.2 oz (65.182 kg) (09/24 0537) Last BM Date: 08/18/15  Filed Weights   08/18/15 1710 08/18/15 2222 08/19/15 0537  Weight: 143 lb (64.864 kg) 143 lb 12.8 oz (65.227 kg) 143 lb 11.2 oz (65.182 kg)    Intake/Output Summary (Last 24 hours) at 08/19/15 1130 Last data filed at 08/19/15 0928  Gross per 24 hour  Intake    360 ml  Output   1200 ml  Net   -840 ml    Exam:  General: NAD  Resp: bilateral crackles  Cardiac: RRR, no m/r/g. +JVD  GI: abdomen soft, NT. + distension  MSK: 2+ bilateral LE edema  Neuro: no focal deficits  Psych: appropriate affect  Lab Results:  Basic Metabolic Panel:  Recent Labs Lab 08/18/15 1810 08/19/15 0815  NA 138 138  K 3.4* 3.4*  CL 107 108  CO2 23 22  GLUCOSE 191* 110*  BUN 39* 39*  CREATININE 2.06* 1.96*  CALCIUM 7.5* 7.4*    Liver Function Tests:  Recent Labs Lab 08/18/15 2125  AST 27  ALT 15  ALKPHOS 52  BILITOT 0.3  PROT 3.8*  ALBUMIN <1.0*    CBC:  Recent Labs Lab 08/18/15 1810 08/19/15 0229  WBC 3.9* 3.7*  HGB 8.3* 7.5*  HCT 26.4* 23.4*  MCV 94.6 94.7  PLT 249 226    Cardiac Enzymes:  Recent Labs Lab 08/18/15 2125 08/19/15 0229 08/19/15 0815  TROPONINI 0.23* 0.21* 0.23*    BNP: No results for input(s): PROBNP in the last 8760 hours.  Coagulation: No results for input(s): INR in the last 168 hours.  ECG:   Medications:   Scheduled Medications: . aspirin EC  81 mg Oral Daily  . atorvastatin  20 mg Oral Daily  . carvedilol  3.125 mg Oral BID WC  . cyclophosphamide  50 mg Oral  Daily  . ezetimibe  10 mg Oral Daily  . furosemide  40 mg Intravenous BID  . heparin  5,000 Units Subcutaneous 3 times per day  . levothyroxine  75 mcg Oral QAC breakfast  . [START ON 08/20/2015] predniSONE  10 mg Oral QODAY  . sodium chloride  3 mL Intravenous Q12H     Infusions:     PRN Medications:  sodium chloride, acetaminophen, ondansetron (ZOFRAN) IV, sodium chloride     Assessment/Plan    1. Acute on chronic diastolic HF - weight up 258--->527 lbs.  - 07/18/15 LVEF 60-65%, no WMAs, grade I diastolic dysfunction, mod TR - negative 380 mL overngiht, she is onlasix 40mg  IV bid (first dose last night). Downtrend in Cr with diuresis consistent with venous congestion and CHF. Will continue current diuretics.  - Cr 5 months ago 1, over teh last month 1.6-1.7  2. Elevated troponin - mild fairly flat elevation to 0.2 - she does have history of CAD with prior MI in 1990.    3. Hypothyroid - on synthroid  4. Anemia - Hgb trending down to 7.5, normocytic. - labs from 06/2015 ferritin 685, Fe  22, folate 1565, B12 295,  - from prior workup thought due to CKD and prior cytoxan use.  Will follow  5. 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  - has been on cytoxan and prednisone - follow renal function with diuresis, if neccesary will get renal involved.   6.Elevated troponin - mld flat elevation in setting of CKD and heart failure. No chest pain.  -likely related to CKD and CHF based on data, she would be a poor candidate for invasive testing at any time due to chronic anemia, CKD, advanced age. Continue medical therapy   Carlyle Dolly, M.D.

## 2015-08-20 LAB — CBC
HEMATOCRIT: 24.1 % — AB (ref 36.0–46.0)
Hemoglobin: 7.8 g/dL — ABNORMAL LOW (ref 12.0–15.0)
MCH: 30.8 pg (ref 26.0–34.0)
MCHC: 32.4 g/dL (ref 30.0–36.0)
MCV: 95.3 fL (ref 78.0–100.0)
Platelets: 224 10*3/uL (ref 150–400)
RBC: 2.53 MIL/uL — ABNORMAL LOW (ref 3.87–5.11)
RDW: 16 % — AB (ref 11.5–15.5)
WBC: 3.4 10*3/uL — AB (ref 4.0–10.5)

## 2015-08-20 LAB — BASIC METABOLIC PANEL
Anion gap: 7 (ref 5–15)
BUN: 39 mg/dL — ABNORMAL HIGH (ref 6–20)
CHLORIDE: 107 mmol/L (ref 101–111)
CO2: 24 mmol/L (ref 22–32)
CREATININE: 2.02 mg/dL — AB (ref 0.44–1.00)
Calcium: 7.3 mg/dL — ABNORMAL LOW (ref 8.9–10.3)
GFR calc non Af Amer: 21 mL/min — ABNORMAL LOW (ref >60)
GFR, EST AFRICAN AMERICAN: 25 mL/min — AB (ref >60)
GLUCOSE: 123 mg/dL — AB (ref 65–99)
Potassium: 3.4 mmol/L — ABNORMAL LOW (ref 3.5–5.1)
Sodium: 138 mmol/L (ref 135–145)

## 2015-08-20 LAB — MAGNESIUM: Magnesium: 1.6 mg/dL — ABNORMAL LOW (ref 1.7–2.4)

## 2015-08-20 MED ORDER — POTASSIUM CHLORIDE CRYS ER 20 MEQ PO TBCR
40.0000 meq | EXTENDED_RELEASE_TABLET | Freq: Once | ORAL | Status: AC
  Administered 2015-08-20: 40 meq via ORAL
  Filled 2015-08-20: qty 2

## 2015-08-20 MED ORDER — FUROSEMIDE 10 MG/ML IJ SOLN
80.0000 mg | Freq: Three times a day (TID) | INTRAMUSCULAR | Status: AC
  Administered 2015-08-20 – 2015-08-23 (×10): 80 mg via INTRAVENOUS
  Filled 2015-08-20 (×9): qty 8

## 2015-08-20 NOTE — Progress Notes (Signed)
Subjective:  Still has dyspnea on exertion and some edema.  He is diuresing some but pressure is holding.  Renal function is getting worse.   Objective:  Vital Signs in the last 24 hours: BP 138/42 mmHg  Pulse 81  Temp(Src) 100.8 F (38.2 C) (Oral)  Resp 18  Ht 5' (1.524 m)  Wt 64.683 kg (142 lb 9.6 oz)  BMI 27.85 kg/m2  SpO2 93%  Physical Exam: Pleasant white female in no acute distress Lungs:  Clear , no rales Cardiac:  Regular rhythm with occasional irregular beats, normal S1 and S2, no S3, 1/6 systolic murmur Abdomen:  Soft, nontender, no masses Extremities:  1-2 plus edema present  Intake/Output from previous day: 09/24 0701 - 09/25 0700 In: 920 [P.O.:920] Out: 1750 [Urine:1750] Weight Filed Weights   08/18/15 2222 08/19/15 0537 08/20/15 0625  Weight: 65.227 kg (143 lb 12.8 oz) 65.182 kg (143 lb 11.2 oz) 64.683 kg (142 lb 9.6 oz)    Lab Results: Basic Metabolic Panel:  Recent Labs  08/19/15 0815 08/20/15 0300  NA 138 138  K 3.4* 3.4*  CL 108 107  CO2 22 24  GLUCOSE 110* 123*  BUN 39* 39*  CREATININE 1.96* 2.02*    CBC:  Recent Labs  08/18/15 1810 08/19/15 0229 08/20/15 0300  WBC 3.9* 3.7* 3.4*  NEUTROABS 3.5  --   --   HGB 8.3* 7.5* 7.8*  HCT 26.4* 23.4* 24.1*  MCV 94.6 94.7 95.3  PLT 249 226 224   BNP    Component Value Date/Time   BNP 270.4* 08/18/2015 1810   Telemetry: Sinus rhythm with occasional PVCs  Assessment/Plan:  1.  Sinus tachycardia on admission with occasional PVCs sent 2.  Severe anemia 3.  Nephrotic syndrome 4.  Worsening acute kidney disease that may be due to diuresis 5.  Hypertensive heart disease  Recommendations:  I have asked nephrology to see her again.  Since her creatinine is climbing with diuresis concerned that the edema may be more due to her renal disease.  I'm going to hold her diuresis today.  The anemia may be contributing to the tachycardia.     Kerry Hough  MD  [facility]  08/20/2015, 9:45 AM

## 2015-08-20 NOTE — Consult Note (Signed)
Renal Service Consult Note [facility]  Erika [name] 08/20/2015 Sol [place] Requesting Physician:  Dr Wynonia Lawman  Reason for Consult:  81 yo with CKD/ neph syndrome due to membranous GN now with SOB and ^'d creatinine.   HPI: The patient is a 81 y.o. year-old w hx of HTN, HL, MI/CAD, diast HF who was diagnosed in April with membranous GN by perc renal biopsy while admitted here for LE edema and anasarca. Cardiac w/u at that time was negative, urine PCR was 11gm and she was seen by renal service. For membranous GN (primary by bx, PLA2R negative) she was started on Cytoxan (and prednisone), however she didn't fill these medications until July actually.  She last saw her renal doctor (Dr Arty Baumgartner) last week and no changes were made to her Cytoxan.  She was here in August with syncope and collapse, diuretics / ARB were held , she was hydrated and improved clinically.  ECHO showed mild diast dysfunciton only.  At dc torsemide was restarted at a lower dose 100 qam and 80 qpm.  Creat was up to 1.7- 1.8 range during that admission.  Now patient presented on 9/23 to ED with reports of irregular rapid heartbeat. Her thyroid pill had been recently increased. EKG showed sinus tach, trop was 0.24, CBC ok, Hb 8.3, creat 2.06.  BNP was 270.   Seen by cardiology who noted dry wt of 132 and present wt of 146 lbs.  She was given lasix and wt down to 142 now. She remains SOB and very 'tired". CXR was negative on admission. Denies orthopnea/ PND, cough. +DOE.  Fatigue.  NO CP, no abd pain, n/v/d, no rash. She has had fevers here to 101 deg. UA showed 11-20 wbc's.  +abd swelling another main complaints.    Date  Creat  eGFR  CKD 2010  0.72 01/2015  0.71 April 2016 1.07- 1.23 39- 46  Stage III Aug 2016 1.63- 1.89 23- 28  Stage IV This admit 1.96- 2.06 21- 22    Past Medical History  Past Medical History  Diagnosis Date  . Hypertension   . Hyperlipidemia   . Myocardial infarction 1990  .  CAD (coronary artery disease)     post MI in 64  . Osteopenia   . Hypothyroidism   . Fall Sept 2012    with left zygoma fracture  . Normal nuclear stress test July 2012    No ischemia.   . Right bundle branch block   . Tachycardia- per BP machine 08/18/2015  . Acute on chronic diastolic HF (heart failure) 08/18/2015   Past Surgical History  Past Surgical History  Procedure Laterality Date  . Cardiac catheterization  10/18/1994    Single vessel disease with a severe stenosis in the RCA which had recannalized since prior study in 1990. Managed medically  . Cardiac catheterization  09/22/89  . Cataract extraction, bilateral  10/2009   Family History  Family History  Problem Relation Age of Onset  . Heart attack Father 66  . Heart attack Mother 76  . Heart disease Brother   . Heart disease Brother    Social History  reports that she has never smoked. She has never used smokeless tobacco. She reports that she does not drink alcohol or use illicit drugs. Allergies No Known Allergies Home medications Prior to Admission medications   Medication Sig Start Date End Date Taking? Authorizing Provider  acetaminophen (TYLENOL) 325 MG tablet Take 650 mg by mouth every 6 (six)  hours as needed for mild pain or moderate pain.   Yes Historical Provider, MD  atorvastatin (LIPITOR) 40 MG tablet Take 20 mg by mouth daily. 07/04/15  Yes Historical Provider, MD  cyclophosphamide (CYTOXAN) 50 MG tablet Take 1 tablet (50 mg total) by mouth daily. Give on an empty stomach 1 hour before or 2 hours after meals. 07/20/15  Yes Domenic Polite, MD  ezetimibe (ZETIA) 10 MG tablet Take 1 tablet (10 mg total) by mouth daily. 10/30/12  Yes Burtis Junes, NP  levothyroxine (SYNTHROID, LEVOTHROID) 100 MCG tablet Take 100 mcg by mouth daily before breakfast.   Yes Historical Provider, MD  metolazone (ZAROXOLYN) 2.5 MG tablet Take 2.5 mg by mouth every Monday, Wednesday, and Friday.   Yes Historical Provider, MD   predniSONE (DELTASONE) 10 MG tablet Take 10 mg by mouth every other day.    Yes Historical Provider, MD  torsemide (DEMADEX) 100 MG tablet Take 100 mg by mouth daily.   Yes Historical Provider, MD   Liver Function Tests  Recent Labs Lab 08/18/15 2125  AST 27  ALT 15  ALKPHOS 52  BILITOT 0.3  PROT 3.8*  ALBUMIN <1.0*   No results for input(s): LIPASE, AMYLASE in the last 168 hours. CBC  Recent Labs Lab 08/18/15 1810 08/19/15 0229 08/20/15 0300  WBC 3.9* 3.7* 3.4*  NEUTROABS 3.5  --   --   HGB 8.3* 7.5* 7.8*  HCT 26.4* 23.4* 24.1*  MCV 94.6 94.7 95.3  PLT 249 226 384   Basic Metabolic Panel  Recent Labs Lab 08/18/15 1810 08/19/15 0815 08/20/15 0300  NA 138 138 138  K 3.4* 3.4* 3.4*  CL 107 108 107  CO2 _0 GLUCOSE 191* 110* 123*  BUN 39* 39* 39*  CREATININE 2.06* 1.96* 2.02*  CALCIUM 7.5* 7.4* 7.3*    Filed Vitals:   08/19/15 1700 08/19/15 2107 08/20/15 0625 08/20/15 1233  BP: 130/60 138/53 138/42 117/53  Pulse: 94  81 82  Temp:  98.8 F (37.1 C) 100.8 F (38.2 C) 97.5 F (36.4 C)  TempSrc:  Oral Oral Axillary  Resp:  _1 Height:      Weight:   64.683 kg (142 lb 9.6 oz)   SpO2:  93% 93% 93%   Exam Elderly WF lying at 20 deg, sligthtly SOB, not in distress [place] O2 Mouth clear +JVD Chest basilar rales on R, left clear RRR 2/6 SEM no RG Abd slightly distended and obese, liver down 5 cm, no mass, +bs GU deferred Ext 2+ pitting edema bilat LE's, trace UE edema Neuro is alert, nf, Ox 3   Date  Creat  eGFR  CKD 2010  0.72 01/2015  0.71 April 2016 1.07- 1.23 39- 46  Stage III Aug 2016 1.63- 1.89 23- 28  Stage IV This admit 1.96- 2.06 21- 22  Net I/O 1.2L in and 2.1L out since admit Wt 64 > 65 > 64.6 kg today CXR 9/23 - lingular atx, R side clear ECHO Aug '16 - LV 60%, grade 1 diast dysfxn, sclerotic AoV, mod TR, mild pulm HTN 34 mmHg UA - >300 prot, 1.025, 11-20 wbc/ 3-6 rbc per hpf  Assessment: 1. SOB/ vol excess - still looks  vol overloaded, and symptomatic. Would continue lasix and ^ dose a bit.  2. CKD/ neph syndrome/ memb GN - no signs of renal response to cytoxan/ pred yet. Has been on them for 2-3 months.  Renal function has deteriorated, now stage IV  CKD.  Retaining more fluid as would be expected w declining GFR.  She is on lower dose Cytoxan and has some leukopenia so no room really to increase 3. HTN 4. Anemia - may be somewhat related to cytoxan and prob also to CKD   Plan- ^lasix 80 tid IV, expect some bump in creat w diuresis. Check Fe,, consider ESA. Poor prognosis with refractory GN for this elderly patient, discussed with patient and questions answered.   Kelly Splinter MD (pgr) [phone]    [phone] 08/20/2015, 4:44 PM

## 2015-08-21 LAB — BASIC METABOLIC PANEL
Anion gap: 6 (ref 5–15)
BUN: 39 mg/dL — ABNORMAL HIGH (ref 6–20)
CALCIUM: 7.6 mg/dL — AB (ref 8.9–10.3)
CHLORIDE: 109 mmol/L (ref 101–111)
CO2: 25 mmol/L (ref 22–32)
CREATININE: 1.84 mg/dL — AB (ref 0.44–1.00)
GFR, EST AFRICAN AMERICAN: 28 mL/min — AB (ref >60)
GFR, EST NON AFRICAN AMERICAN: 24 mL/min — AB (ref >60)
Glucose, Bld: 110 mg/dL — ABNORMAL HIGH (ref 65–99)
Potassium: 3.9 mmol/L (ref 3.5–5.1)
SODIUM: 140 mmol/L (ref 135–145)

## 2015-08-21 LAB — IRON AND TIBC: IRON: 29 ug/dL (ref 28–170)

## 2015-08-21 LAB — PREPARE RBC (CROSSMATCH)

## 2015-08-21 LAB — CBC
HCT: 24 % — ABNORMAL LOW (ref 36.0–46.0)
Hemoglobin: 7.6 g/dL — ABNORMAL LOW (ref 12.0–15.0)
MCH: 30 pg (ref 26.0–34.0)
MCHC: 31.7 g/dL (ref 30.0–36.0)
MCV: 94.9 fL (ref 78.0–100.0)
PLATELETS: 245 10*3/uL (ref 150–400)
RBC: 2.53 MIL/uL — AB (ref 3.87–5.11)
RDW: 15.9 % — ABNORMAL HIGH (ref 11.5–15.5)
WBC: 3.7 10*3/uL — AB (ref 4.0–10.5)

## 2015-08-21 LAB — ABO/RH: ABO/RH(D): O POS

## 2015-08-21 MED ORDER — SODIUM CHLORIDE 0.9 % IV SOLN
Freq: Once | INTRAVENOUS | Status: AC
  Administered 2015-08-21: 250 mL via INTRAVENOUS

## 2015-08-21 MED ORDER — FUROSEMIDE 10 MG/ML IJ SOLN
40.0000 mg | Freq: Once | INTRAMUSCULAR | Status: DC
  Filled 2015-08-21: qty 4

## 2015-08-21 MED ORDER — CARVEDILOL 3.125 MG PO TABS
3.1250 mg | ORAL_TABLET | Freq: Two times a day (BID) | ORAL | Status: DC
  Administered 2015-08-21 – 2015-08-28 (×15): 3.125 mg via ORAL
  Filled 2015-08-21 (×15): qty 1

## 2015-08-21 MED ORDER — MAGNESIUM SULFATE IN D5W 10-5 MG/ML-% IV SOLN
1.0000 g | Freq: Once | INTRAVENOUS | Status: AC
  Administered 2015-08-21: 1 g via INTRAVENOUS
  Filled 2015-08-21: qty 100

## 2015-08-21 NOTE — Progress Notes (Signed)
Subjective:  Less tachycardia at the present time.  She is still is dyspneic on exertion.  She has significant anemia with hemoglobin below 8.  Appreciate renal note.  I think it's difficult to tell her volume status because of the severe hypoalbuminemia.  She wants to try to go home today.  Objective:  Vital Signs in the last 24 hours: BP 135/85 mmHg  Pulse 87  Temp(Src) 99.8 F (37.7 C) (Oral)  Resp 20  Ht 5' (1.524 m)  Wt 64.32 kg (141 lb 12.8 oz)  BMI 27.69 kg/m2  SpO2 93%  Physical Exam: Pleasant white female in no acute distress Lungs:  Clear , no rales Cardiac:  Regular rhythm with occasional irregular beats, normal S1 and S2, no S3, 1/6 systolic murmur Abdomen:  Soft, nontender, no masses Extremities:  1-2 plus edema present  Intake/Output from previous day: 09/25 0701 - 09/26 0700 In: 650 [P.O.:650] Out: 700 [Urine:700] Weight Filed Weights   08/19/15 0537 08/20/15 0625 08/21/15 0434  Weight: 65.182 kg (143 lb 11.2 oz) 64.683 kg (142 lb 9.6 oz) 64.32 kg (141 lb 12.8 oz)    Lab Results: Basic Metabolic Panel:  Recent Labs  08/20/15 0300 08/21/15 0328  NA 138 140  K 3.4* 3.9  CL 107 109  CO2 24 25  GLUCOSE 123* 110*  BUN 39* 39*  CREATININE 2.02* 1.84*    CBC:  Recent Labs  08/18/15 1810  08/20/15 0300 08/21/15 0328  WBC 3.9*  < > 3.4* 3.7*  NEUTROABS 3.5  --   --   --   HGB 8.3*  < > 7.8* 7.6*  HCT 26.4*  < > 24.1* 24.0*  MCV 94.6  < > 95.3 94.9  PLT 249  < > 224 245  < > = values in this interval not displayed. BNP    Component Value Date/Time   BNP 270.4* 08/18/2015 1810   Telemetry: Sinus rhythm with occasional PVCs  Assessment/Plan:  1.  Sinus tachycardia on admission with occasional PVCs seen-she is currently in sinus rhythm 2.  Severe anemia-wonder she needs to have a transfusion 3.  Nephrotic syndrome 4.  Chronic kidney disease somewhat better today 5.  Hypertensive heart disease  Recommendations:  Renal function is  improved today.  Wonder she needs either to have a transfusion or increased dose of EPO.  Plan discussion with renal.  Should be able to go home on increased diuretics dose and a lower dose of her thyroid medicine.  If no transfusion plan should be able to go home later.   Kerry Hough  MD [facility] Card[facility]  08/21/2015, 9:12 AM

## 2015-08-21 NOTE — Progress Notes (Signed)
At 1520 introduced self to pt as incoming nurse 3p-7p.  Call bell at reach.  Instructed to call for assistance as needed.  Pt verbalized understanding.  Will continue to monitor.  Karie Kirks, Therapist, sports.

## 2015-08-21 NOTE — Progress Notes (Signed)
Spoke to Dr. Virgina Jock.  She has severe symptomatic anemia. Plan to transfuse 1 unit of prBC today and watch overnight.  If stable d/c home on a lower dose of thyroid medicine.   Spoke to patient by phone and informed of transfusion.  Kerry Hough MD [facility]

## 2015-08-21 NOTE — Care Management Important Message (Signed)
Important Message  Patient Details  Name: Erika [name] MRN: [id number] Date of Birth: 03/22/1928   Medicare Important Message Given:  Yes-second notification given    Delorse Lek 08/21/2015, 3:08 PM

## 2015-08-21 NOTE — Progress Notes (Signed)
  [facility] Progress Note    Assessment/ Plan:   1. CKD Stage IV/Membranous GN/Nephrotic Syndrome: Patient still volume up, but symptoms improved. Cr down to 1.84 from 2.02. Likely to go home today. Recommend Lasix 80 mg PO BID- will check with Dr. Justin Mend. Continue Cytoxan 50 mg daily and Prednisone 10 mg daily. Will need to follow up with Dr. Arty Baumgartner.  2. HTN: BP in 694W-546E systolic. Continue Coreg 3.125 mg BID.  3. Anemia: Hgb stable at 7.6. Iron low normal at 29. Saturation unable to be calculated. May need increase in EPO.  4. Hypomagnesemia: Mg 1.6 this AM. Will give magnesium sulfate 1 g.    Subjective:   No acute events overnight. Patient reports some SOB, especially on exertion. Notes swelling all over.    Objective:   BP 144/57 mmHg  Pulse 86  Temp(Src) 101.9 F (38.8 C) (Oral)  Resp 20  Ht 5' (1.524 m)  Wt 141 lb 12.8 oz (64.32 kg)  BMI 27.69 kg/m2  SpO2 93%  Intake/Output Summary (Last 24 hours) at 08/21/15 1155 Last data filed at 08/21/15 1037  Gross per 24 hour  Intake    653 ml  Output   1050 ml  Net   -397 ml   Weight change: -12.8 oz (-0.363 kg)  Physical Exam: Gen: elderly woman resting in bed CVS: RRR, 2/6 systolic murmur Resp: CTA bilaterally, breaths non-labored at rest Abd: BS+, soft, non-tender Ext: 2+ pitting edema in lower extremities   Imaging: No results found.  Labs: BMET  Recent Labs Lab 08/18/15 1810 08/19/15 0815 08/20/15 0300 08/21/15 0328  NA 138 138 138 140  K 3.4* 3.4* 3.4* 3.9  CL 107 108 107 109  CO2 23 22 24 25   GLUCOSE 191* 110* 123* 110*  BUN 39* 39* 39* 39*  CREATININE 2.06* 1.96* 2.02* 1.84*  CALCIUM 7.5* 7.4* 7.3* 7.6*   CBC  Recent Labs Lab 08/18/15 1810 08/19/15 0229 08/20/15 0300 08/21/15 0328  WBC 3.9* 3.7* 3.4* 3.7*  NEUTROABS 3.5  --   --   --   HGB 8.3* 7.5* 7.8* 7.6*  HCT 26.4* 23.4* 24.1* 24.0*  MCV 94.6 94.7 95.3 94.9  PLT 249 226 224 245    Medications:    . aspirin  EC  81 mg Oral Daily  . atorvastatin  20 mg Oral Daily  . carvedilol  3.125 mg Oral BID WC  . cyclophosphamide  50 mg Oral Daily  . ezetimibe  10 mg Oral Daily  . furosemide  80 mg Intravenous Q8H  . heparin  5,000 Units Subcutaneous 3 times per day  . levothyroxine  75 mcg Oral QAC breakfast  . predniSONE  10 mg Oral QODAY  . sodium chloride  3 mL Intravenous Q12H      Albin Felling, MD, MPH Internal Medicine Resident, PGY II 08/21/2015, 11:55 AM

## 2015-08-21 NOTE — Progress Notes (Signed)
Patient with order for blood trnx. Consent signed. Husband at bedside at this time. Blood trnx reaction/SE explained and reviewed with both pt/family. All questions answered at this time.     Ave Filter, RN

## 2015-08-22 ENCOUNTER — Inpatient Hospital Stay (HOSPITAL_COMMUNITY): Payer: Medicare Other

## 2015-08-22 DIAGNOSIS — D638 Anemia in other chronic diseases classified elsewhere: Secondary | ICD-10-CM | POA: Diagnosis present

## 2015-08-22 DIAGNOSIS — A419 Sepsis, unspecified organism: Secondary | ICD-10-CM | POA: Diagnosis not present

## 2015-08-22 LAB — COMPREHENSIVE METABOLIC PANEL
ALK PHOS: 51 U/L (ref 38–126)
ALT: 15 U/L (ref 14–54)
AST: 35 U/L (ref 15–41)
Albumin: <1 g/dL — ABNORMAL LOW (ref 3.5–5.0)
Anion gap: 11 (ref 5–15)
BILIRUBIN TOTAL: 0.2 mg/dL — AB (ref 0.3–1.2)
BUN: 42 mg/dL — ABNORMAL HIGH (ref 6–20)
CALCIUM: 7.5 mg/dL — AB (ref 8.9–10.3)
CO2: 21 mmol/L — ABNORMAL LOW (ref 22–32)
Chloride: 105 mmol/L (ref 101–111)
Creatinine, Ser: 1.8 mg/dL — ABNORMAL HIGH (ref 0.44–1.00)
GFR, EST AFRICAN AMERICAN: 28 mL/min — AB (ref >60)
GFR, EST NON AFRICAN AMERICAN: 25 mL/min — AB (ref >60)
GLUCOSE: 178 mg/dL — AB (ref 65–99)
POTASSIUM: 2.8 mmol/L — AB (ref 3.5–5.1)
Sodium: 137 mmol/L (ref 135–145)
TOTAL PROTEIN: 3.8 g/dL — AB (ref 6.5–8.1)

## 2015-08-22 LAB — CBC
HEMATOCRIT: 29.4 % — AB (ref 36.0–46.0)
HEMOGLOBIN: 9.7 g/dL — AB (ref 12.0–15.0)
MCH: 29.8 pg (ref 26.0–34.0)
MCHC: 33 g/dL (ref 30.0–36.0)
MCV: 90.5 fL (ref 78.0–100.0)
Platelets: 224 10*3/uL (ref 150–400)
RBC: 3.25 MIL/uL — ABNORMAL LOW (ref 3.87–5.11)
RDW: 17.5 % — AB (ref 11.5–15.5)
WBC: 3 10*3/uL — ABNORMAL LOW (ref 4.0–10.5)

## 2015-08-22 LAB — URINALYSIS, ROUTINE W REFLEX MICROSCOPIC
Bilirubin Urine: NEGATIVE
Glucose, UA: 500 mg/dL — AB
Ketones, ur: NEGATIVE mg/dL
Leukocytes, UA: NEGATIVE
Nitrite: NEGATIVE
Protein, ur: >300 mg/dL — AB
SPECIFIC GRAVITY, URINE: 1.021 (ref 1.005–1.030)
Urobilinogen, UA: 0.2 mg/dL (ref 0.0–1.0)
pH: 6 (ref 5.0–8.0)

## 2015-08-22 LAB — TYPE AND SCREEN
ABO/RH(D): O POS
Antibody Screen: NEGATIVE
Unit division: 0

## 2015-08-22 LAB — URINE MICROSCOPIC-ADD ON

## 2015-08-22 LAB — LACTIC ACID, PLASMA
Lactic Acid, Venous: 2.6 mmol/L (ref 0.5–2.0)
Lactic Acid, Venous: 2 mmol/L (ref 0.5–2.0)

## 2015-08-22 LAB — PROCALCITONIN: Procalcitonin: 0.38 ng/mL

## 2015-08-22 MED ORDER — POTASSIUM CHLORIDE CRYS ER 20 MEQ PO TBCR
40.0000 meq | EXTENDED_RELEASE_TABLET | Freq: Once | ORAL | Status: AC
  Administered 2015-08-22: 40 meq via ORAL
  Filled 2015-08-22: qty 2

## 2015-08-22 MED ORDER — MAGNESIUM SULFATE 2 GM/50ML IV SOLN
2.0000 g | Freq: Once | INTRAVENOUS | Status: AC
  Administered 2015-08-22: 2 g via INTRAVENOUS
  Filled 2015-08-22: qty 50

## 2015-08-22 MED ORDER — PIPERACILLIN-TAZOBACTAM IN DEX 2-0.25 GM/50ML IV SOLN
2.2500 g | Freq: Three times a day (TID) | INTRAVENOUS | Status: DC
Start: 2015-08-22 — End: 2015-08-24
  Administered 2015-08-22 – 2015-08-24 (×6): 2.25 g via INTRAVENOUS
  Filled 2015-08-22 (×8): qty 50

## 2015-08-22 NOTE — Progress Notes (Signed)
Pt. With temp. Of 102.5 Text page sent to on call MD for Cardiology.

## 2015-08-22 NOTE — Progress Notes (Signed)
ANTIBIOTIC CONSULT NOTE - INITIAL  Pharmacy Consult for Zosyn Indication: rule out sepsis  No Known Allergies  Patient Measurements: Height: 5' (152.4 cm) Weight: 139 lb 14.4 oz (63.458 kg) IBW/kg (Calculated) : 45.5 Adjusted Body Weight:   Vital Signs: Temp: 97.7 F (36.5 C) (09/27 1439) Temp Source: Oral (09/27 1439) BP: 118/51 mmHg (09/27 1439) Pulse Rate: 72 (09/27 1439) Intake/Output from previous day: 09/26 0701 - 09/27 0700 In: 2083 [P.O.:1310; I.V.:3; Blood:670; IV Piggyback:100] Out: 2202 [Urine:2200; Stool:2] Intake/Output from this shift: Total I/O In: 360 [P.O.:360] Out: 450 [Urine:450]  Labs:  Recent Labs  08/20/15 0300 08/21/15 0328 08/22/15 0312 08/22/15 1452  WBC 3.4* 3.7* 3.0*  --   HGB 7.8* 7.6* 9.7*  --   PLT 224 245 224  --   CREATININE 2.02* 1.84*  --  1.80*   Estimated Creatinine Clearance: 19 mL/min (by C-G formula based on Cr of 1.8). No results for input(s): VANCOTROUGH, VANCOPEAK, VANCORANDOM, GENTTROUGH, GENTPEAK, GENTRANDOM, TOBRATROUGH, TOBRAPEAK, TOBRARND, AMIKACINPEAK, AMIKACINTROU, AMIKACIN in the last 72 hours.   Microbiology: No results found for this or any previous visit (from the past 720 hour(s)).  Medical History: Past Medical History  Diagnosis Date  . Hypertension   . Hyperlipidemia   . Myocardial infarction 1990  . CAD (coronary artery disease)     post MI in 70  . Osteopenia   . Hypothyroidism   . Fall Sept 2012    with left zygoma fracture  . Normal nuclear stress test July 2012    No ischemia.   . Right bundle branch block   . Tachycardia- per BP machine 08/18/2015  . Acute on chronic diastolic HF (heart failure) 08/18/2015    Medications:  Prescriptions prior to admission  Medication Sig Dispense Refill Last Dose  . acetaminophen (TYLENOL) 325 MG tablet Take 650 mg by mouth every 6 (six) hours as needed for mild pain or moderate pain.   08/18/2015 at Unknown time  . atorvastatin (LIPITOR) 40 MG tablet  Take 20 mg by mouth daily.   08/18/2015 at Unknown time  . cyclophosphamide (CYTOXAN) 50 MG tablet Take 1 tablet (50 mg total) by mouth daily. Give on an empty stomach 1 hour before or 2 hours after meals.   08/18/2015 at Unknown time  . ezetimibe (ZETIA) 10 MG tablet Take 1 tablet (10 mg total) by mouth daily. 15 tablet 0 08/18/2015 at Unknown time  . levothyroxine (SYNTHROID, LEVOTHROID) 100 MCG tablet Take 100 mcg by mouth daily before breakfast.   08/18/2015 at Unknown time  . metolazone (ZAROXOLYN) 2.5 MG tablet Take 2.5 mg by mouth every Monday, Wednesday, and Friday.   08/18/2015 at Unknown time  . predniSONE (DELTASONE) 10 MG tablet Take 10 mg by mouth every other day.    08/18/2015 at Unknown time  . torsemide (DEMADEX) 100 MG tablet Take 100 mg by mouth daily.   08/18/2015 at Unknown time   Scheduled:  . aspirin EC  81 mg Oral Daily  . atorvastatin  20 mg Oral Daily  . carvedilol  3.125 mg Oral BID WC  . ezetimibe  10 mg Oral Daily  . furosemide  40 mg Intravenous Once  . furosemide  80 mg Intravenous Q8H  . heparin  5,000 Units Subcutaneous 3 times per day  . levothyroxine  75 mcg Oral QAC breakfast  . predniSONE  10 mg Oral QODAY  . sodium chloride  3 mL Intravenous Q12H   Infusions:    Assessment: 81yo female with  history of CAD, HTN, HLD, hypothyroid and membranous nephropathy presents with worsening LE edema. Pharmacy is consulted to dose zosyn for suspected sepsis. Tmax 102.5, WBC 3.0, sCr 1.8.  Goal of Therapy:  Eradication of infection  Plan:  Zosyn 2.25g IV q8h Follow up culture results, renal function and clinical course  Andrey Cota. Diona Foley, PharmD Clinical Pharmacist Pager [phone] 08/22/2015,4:51 PM

## 2015-08-22 NOTE — Consult Note (Signed)
[facility] Medical Consultation  ANTOINETTE BORGWARDT NTI:[id number] DOB: Sep 01, 1928 DOA: 08/18/2015 PCP: Precious Reel, MD   Requesting physician: Dr. Wynonia Lawman  Date of consultation: 08/22/2015 Reason for consultation: new onset fever  Impression/Recommendations  Principal Problem:   Sepsis - criteria met for sepsis on 08/22/15, HR > 90, WBC 3.0 (pt on immunosuppressants), T 102.5, elevated lactic acid and procalcitonin level - source is unclear - blood culture already requested, UA, Urine culture ordered - CXR pending - place on empiric Zosyn for now, hold on Vancomycin due to renal failure - repeat lactic acid to make sure it is trending down, may need to give some fluids but will hold off for now as pt is on lasix   Active Problems:   Acute on CKD Stage IV/Membranous GN/Nephrotic Syndrome - Edema improved. She made 2.2L of urine over last 24 hours.  - Cr trending down in the past 24 hours from 2.02 --> 1.84 this AM - currently on Lasix 80 mg IV Q8 hours - renal panel in AM    Leukopenia - in pt on immunosuppressant therapy - due to fevers, will treat with empiric ABX for presumptive sepsis as noted above - CBC In AM    Acute on chronic diastolic ACC/AHA stage C congestive heart failure - per cardiology     CAD (coronary artery disease) - clinically stable at this time    Anasarca - secondary to Membranous GN - weight is trending down with Lasix from 143 lbs on admission --> 139 lbs this AM - continue to monitor daily weights, strict I/O    Hypokalemia - in the setting of Hypomagnesemia - will supplement both electrolytes - repeat levels in AM    Essential HTN - continue Coreg and Lasix     Anemia of chronic disease, CKD and IDA - Hg trending up post transfusion from 7.6 9/26 to 9.7 this AM - CBC in AM    Hyperlipemia - continue Zetia     Hypothyroidism - continue synthroid per home medical regimen     Moderate PCM - nutritionist consulted     DVT  prophylaxis - Heparin SQ  Chief Complaint: new onset fevers   HPI:  81 year old female with hx of CAD with MI in 1990 last cath 1995 with single vessel disease and severe stenosis in the RCA which had recannalized since prior study in 1990, HTN, HLD, hypothyroidism, membranous nephropathy, most recent ECHO with EF 60-65% G1DD, presented on 9/23/2016under cardiology service for evaluation of progressively worsening LE edema. She was diureses and continued to improve but has developed fevers of 102.5 F in the past 24 hours and [facility] consulted for assistance. Pt denies chest pain or shortness of breath at this time, no specific abd or urinary concerns except some dysuria. Pt reports she felt hot for few days now but thought this was due to her no getting out the bed as much. She denies headaches or visual changes. Due to complexity of medical issues, pt determined to be appropriate for internal medicine service, keeping cardiology team as consulting team.  Review of Systems:  Constitutional: Negative for diaphoresis, activity change, appetite change and fatigue.  HENT: Negative for ear pain, nosebleeds, congestion, facial swelling, rhinorrhea, neck pain, neck stiffness and ear discharge.   Eyes: Negative for pain, discharge, redness, itching and visual disturbance.  Respiratory: Negative for cough, choking, chest tightness, wheezing and stridor.   Cardiovascular: Negative for chest pain, palpitations and leg swelling.  Gastrointestinal: Negative for abdominal  distention.  Genitourinary: Negative for urgency, frequency, hematuria, flank pain, decreased urine volume.  Musculoskeletal: Negative for back pain, joint swelling, arthralgias and gait problem.  Neurological: Negative for dizziness, tremors, seizures Hematological: Negative for adenopathy. Does not bruise/bleed easily.  Psychiatric/Behavioral: Negative for hallucinations, behavioral problems, confusion, dysphoric mood, decreased concentration and  agitation.   Past Medical History  Diagnosis Date  . Hypertension   . Hyperlipidemia   . Myocardial infarction 1990  . CAD (coronary artery disease)     post MI in 33  . Osteopenia   . Hypothyroidism   . Fall Sept 2012    with left zygoma fracture  . Normal nuclear stress test July 2012    No ischemia.   . Right bundle branch block   . Tachycardia- per BP machine 08/18/2015  . Acute on chronic diastolic HF (heart failure) 08/18/2015   Past Surgical History  Procedure Laterality Date  . Cardiac catheterization  10/18/1994    Single vessel disease with a severe stenosis in the RCA which had recannalized since prior study in 1990. Managed medically  . Cardiac catheterization  09/22/89  . Cataract extraction, bilateral  10/2009   Social History:  reports that she has never smoked. She has never used smokeless tobacco. She reports that she does not drink alcohol or use illicit drugs.  No Known Allergies Family History  Problem Relation Age of Onset  . Heart attack Father 10  . Heart attack Mother 21  . Heart disease Brother   . Heart disease Brother     Prior to Admission medications   Medication Sig Start Date End Date Taking? Authorizing Provider  acetaminophen (TYLENOL) 325 MG tablet Take 650 mg by mouth every 6 (six) hours as needed for mild pain or moderate pain.   Yes Historical Provider, MD  atorvastatin (LIPITOR) 40 MG tablet Take 20 mg by mouth daily. 07/04/15  Yes Historical Provider, MD  cyclophosphamide (CYTOXAN) 50 MG tablet Take 1 tablet (50 mg total) by mouth daily. Give on an empty stomach 1 hour before or 2 hours after meals. 07/20/15  Yes Domenic Polite, MD  ezetimibe (ZETIA) 10 MG tablet Take 1 tablet (10 mg total) by mouth daily. 10/30/12  Yes Burtis Junes, NP  levothyroxine (SYNTHROID, LEVOTHROID) 100 MCG tablet Take 100 mcg by mouth daily before breakfast.   Yes Historical Provider, MD  metolazone (ZAROXOLYN) 2.5 MG tablet Take 2.5 mg by mouth every Monday,  Wednesday, and Friday.   Yes Historical Provider, MD  predniSONE (DELTASONE) 10 MG tablet Take 10 mg by mouth every other day.    Yes Historical Provider, MD  torsemide (DEMADEX) 100 MG tablet Take 100 mg by mouth daily.   Yes Historical Provider, MD   Physical Exam: Blood pressure 117/53, pulse 88, temperature 97.8 F (36.6 C), temperature source Oral, resp. rate 19, height 5' (1.524 m), weight 63.458 kg (139 lb 14.4 oz), SpO2 96 %. Filed Vitals:   08/22/15 0846  BP: 117/53  Pulse: 88  Temp: 97.8 F (36.6 C)  Resp:    Physical Exam  Constitutional: Appears well-developed and well-nourished. No distress.  HENT: Normocephalic. External right and left ear normal. Oropharynx is clear and moist.  Eyes: Conjunctivae and EOM are normal. PERRLA, no scleral icterus.  Neck: Normal ROM. Neck supple. No JVD. No tracheal deviation. No thyromegaly.  CVS: RRR, S1/S2 +, no gallops, no carotid bruit.  Pulmonary: Effort and breath sounds normal, no stridor, diminished breath sounds at bases  Abdominal: Soft.  BS +,  no distension, tenderness, rebound or guarding.  Musculoskeletal: Normal range of motion. +2 bilateral LE edema  Lymphadenopathy: No lymphadenopathy noted, cervical, inguinal. Neuro: Alert. Normal reflexes, muscle tone coordination. No cranial nerve deficit. Skin: Skin is warm and dry. No rash noted. Not diaphoretic. No erythema. No pallor.  Psychiatric: Normal mood and affect. Behavior, judgment, thought content normal.   Labs on Admission:  Basic Metabolic Panel:  Recent Labs Lab 08/18/15 1810 08/19/15 0815 08/20/15 0300 08/21/15 0328  NA 138 138 138 140  K 3.4* 3.4* 3.4* 3.9  CL 107 108 107 109  CO2 $Re'23 22 24 25  'qaL$ GLUCOSE 191* 110* 123* 110*  BUN 39* 39* 39* 39*  CREATININE 2.06* 1.96* 2.02* 1.84*  CALCIUM 7.5* 7.4* 7.3* 7.6*  MG  --   --  1.6*  --    Liver Function Tests:  Recent Labs Lab 08/18/15 2125  AST 27  ALT 15  ALKPHOS 52  BILITOT 0.3  PROT 3.8*  ALBUMIN  <1.0*   CBC:  Recent Labs Lab 08/18/15 1810 08/19/15 0229 08/20/15 0300 08/21/15 0328 08/22/15 0312  WBC 3.9* 3.7* 3.4* 3.7* 3.0*  NEUTROABS 3.5  --   --   --   --   HGB 8.3* 7.5* 7.8* 7.6* 9.7*  HCT 26.4* 23.4* 24.1* 24.0* 29.4*  MCV 94.6 94.7 95.3 94.9 90.5  PLT 249 226 224 245 224   Cardiac Enzymes:  Recent Labs Lab 08/18/15 2125 08/19/15 0229 08/19/15 0815  TROPONINI 0.23* 0.21* 0.23*   Radiological Exams on Admission: No results found.  EKG: pending  Time spent: 60 minutes   MAGICK-MYERS, ISKRA [facility] Pager [phone]  If 7PM-7AM, please contact night-coverage [email or web address] Password [email or web address] 08/22/2015, 1:39 PM

## 2015-08-22 NOTE — Progress Notes (Signed)
Utilization Review Completed.[name], Erika T9/27/2016  

## 2015-08-22 NOTE — Progress Notes (Signed)
Subjective:  Still dyspneic with exertion but tolerated transfusion well.  Had temperature to 102 last night and was sweating.  Looking back on vital signs his had some nocturnal temperature spikes.  She is on Cytoxan and white count is low.  She has a mild nonproductive cough, no urinary symptoms or other symptoms now.  Objective:  Vital Signs in the last 24 hours: BP 117/53 mmHg  Pulse 88  Temp(Src) 97.8 F (36.6 C) (Oral)  Resp 19  Ht 5' (1.524 m)  Wt 63.458 kg (139 lb 14.4 oz)  BMI 27.32 kg/m2  SpO2 96%  Physical Exam: Elderly female in no acute distress does have some clearing of her throat Lungs:  Clear  Cardiac:  Regular rhythm, normal S1 and S2, no S3 Abdomen:  Soft, nontender, no masses Extremities:  1-2+ edema present  Intake/Output from previous day: 09/26 0701 - 09/27 0700 In: 2083 [P.O.:1310; I.V.:3; Blood:670; IV Piggyback:100] Out: 2202 [Urine:2200; Stool:2] Weight Filed Weights   08/20/15 0625 08/21/15 0434 08/22/15 0316  Weight: 64.683 kg (142 lb 9.6 oz) 64.32 kg (141 lb 12.8 oz) 63.458 kg (139 lb 14.4 oz)    Lab Results: Basic Metabolic Panel:  Recent Labs  08/20/15 0300 08/21/15 0328  NA 138 140  K 3.4* 3.9  CL 107 109  CO2 24 25  GLUCOSE 123* 110*  BUN 39* 39*  CREATININE 2.02* 1.84*    CBC:  Recent Labs  08/21/15 0328 08/22/15 0312  WBC 3.7* 3.0*  HGB 7.6* 9.7*  HCT 24.0* 29.4*  MCV 94.9 90.5  PLT 245 224    BNP    Component Value Date/Time   BNP 270.4* 08/18/2015 1810   Telemetry: Sinus with PVCs and PACs  Assessment/Plan:  1.  Fever of uncertain etiology predominantly nocturnal 2.  Anemia improved following transfusion yesterday 3.  Chronic diastolic heart failure 4.  Severe nephrotic syndrome and nephritis with albumen of less than 1  Recommendations:  Feeling comfortable sending her home today with a temperature of 102 last night.  We'll obtain blood cultures, urine culture and ask for internal medicine consult.  I  spoke to the nephrologists he would like an internal medicine consult.  I am not convinced that this is a primary cardiac issue and may be more related to the hypoalbuminemia.  Will continue diuresis as long as it does not drive creatinine up.  She also asks about home oxygen.  Oxygen saturations have not been low but will need to evaluate for that.      Kerry Hough  MD [facility]  08/22/2015, 1:08 PM

## 2015-08-22 NOTE — Progress Notes (Signed)
  [facility] Progress Note    Assessment/ Plan:   1. CKD Stage IV/Membranous GN/Nephrotic Syndrome: Edema improved. She made 2.2L of urine over last 24 hours. Cr down to 1.84 from 2.02 yesterday (baseline around 1.7). On Lasix 80mg  IV q8hr. On Cytoxan 50 mg daily and Prednisone 10 mg daily. No emergent HD needs today. Will need to follow up with Dr. Arty Baumgartner.  2. Fever: Fever to 102.5 at 3AM. Afebrile presently. Leukopenia to 3 today from 3.9 on admission. Blood culture and urine culture ordered. Urinalysis and CXR pending. Will discontinue cytoxan. 2. HTN: BP in 643P-295J systolic. Continue Coreg 3.125 mg BID.  3. Anemia: Hgb 9.7 from 7.6 yesterday. Received 1u pRBC 9/26. Iron low normal at 29. Saturation unable to be calculated. May need increase in EPO.  4. Hypomagnesemia: Mg 1.6 yesterday AM. She was given magnesium sulfate 1 g.   Subjective:   She reports she is doing well today. Her breathing is almost back to baseline. She notes her edema has improved. Denies dysuria or mouth sores.    Objective:   BP 117/53 mmHg  Pulse 88  Temp(Src) 97.8 F (36.6 C) (Oral)  Resp 19  Ht 5' (1.524 m)  Wt 139 lb 14.4 oz (63.458 kg)  BMI 27.32 kg/m2  SpO2 96%  Intake/Output Summary (Last 24 hours) at 08/22/15 1352 Last data filed at 08/22/15 0900  Gross per 24 hour  Intake   1860 ml  Output   1952 ml  Net    -92 ml   Weight change: -1 lb 14.4 oz (-0.862 kg)  Physical Exam: Gen: elderly woman resting in bed CVS: RRR, 2/6 systolic murmur Resp: CTA bilaterally, breaths non-labored at rest Abd: BS+, soft, non-tender Ext: 1-2+ pitting edema in lower extremities   Imaging: No results found.  Labs: BMET  Recent Labs Lab 08/18/15 1810 08/19/15 0815 08/20/15 0300 08/21/15 0328  NA 138 138 138 140  K 3.4* 3.4* 3.4* 3.9  CL 107 108 107 109  CO2 23 22 24 25   GLUCOSE 191* 110* 123* 110*  BUN 39* 39* 39* 39*  CREATININE 2.06* 1.96* 2.02* 1.84*  CALCIUM 7.5* 7.4*  7.3* 7.6*   CBC  Recent Labs Lab 08/18/15 1810 08/19/15 0229 08/20/15 0300 08/21/15 0328 08/22/15 0312  WBC 3.9* 3.7* 3.4* 3.7* 3.0*  NEUTROABS 3.5  --   --   --   --   HGB 8.3* 7.5* 7.8* 7.6* 9.7*  HCT 26.4* 23.4* 24.1* 24.0* 29.4*  MCV 94.6 94.7 95.3 94.9 90.5  PLT 249 226 224 245 224    Medications:    . aspirin EC  81 mg Oral Daily  . atorvastatin  20 mg Oral Daily  . carvedilol  3.125 mg Oral BID WC  . cyclophosphamide  50 mg Oral Daily  . ezetimibe  10 mg Oral Daily  . furosemide  40 mg Intravenous Once  . furosemide  80 mg Intravenous Q8H  . heparin  5,000 Units Subcutaneous 3 times per day  . levothyroxine  75 mcg Oral QAC breakfast  . predniSONE  10 mg Oral QODAY  . sodium chloride  3 mL Intravenous Q12H      Jacques Earthly, MD Internal Medicine Resident, PGY 2 08/22/2015, 1:52 PM

## 2015-08-23 ENCOUNTER — Inpatient Hospital Stay (HOSPITAL_COMMUNITY): Admission: RE | Admit: 2015-08-23 | Payer: Medicare Other | Source: Ambulatory Visit

## 2015-08-23 DIAGNOSIS — A408 Other streptococcal sepsis: Secondary | ICD-10-CM

## 2015-08-23 DIAGNOSIS — N179 Acute kidney failure, unspecified: Secondary | ICD-10-CM

## 2015-08-23 DIAGNOSIS — N184 Chronic kidney disease, stage 4 (severe): Secondary | ICD-10-CM

## 2015-08-23 DIAGNOSIS — N049 Nephrotic syndrome with unspecified morphologic changes: Secondary | ICD-10-CM

## 2015-08-23 DIAGNOSIS — E876 Hypokalemia: Secondary | ICD-10-CM

## 2015-08-23 LAB — BASIC METABOLIC PANEL
Anion gap: 8 (ref 5–15)
BUN: 44 mg/dL — AB (ref 6–20)
CO2: 26 mmol/L (ref 22–32)
CREATININE: 1.91 mg/dL — AB (ref 0.44–1.00)
Calcium: 7.6 mg/dL — ABNORMAL LOW (ref 8.9–10.3)
Chloride: 103 mmol/L (ref 101–111)
GFR calc Af Amer: 26 mL/min — ABNORMAL LOW (ref >60)
GFR, EST NON AFRICAN AMERICAN: 23 mL/min — AB (ref >60)
GLUCOSE: 101 mg/dL — AB (ref 65–99)
POTASSIUM: 3.1 mmol/L — AB (ref 3.5–5.1)
SODIUM: 137 mmol/L (ref 135–145)

## 2015-08-23 LAB — CBC
HCT: 29.7 % — ABNORMAL LOW (ref 36.0–46.0)
Hemoglobin: 9.7 g/dL — ABNORMAL LOW (ref 12.0–15.0)
MCH: 29.3 pg (ref 26.0–34.0)
MCHC: 32.7 g/dL (ref 30.0–36.0)
MCV: 89.7 fL (ref 78.0–100.0)
PLATELETS: 242 10*3/uL (ref 150–400)
RBC: 3.31 MIL/uL — ABNORMAL LOW (ref 3.87–5.11)
RDW: 17.4 % — AB (ref 11.5–15.5)
WBC: 3.6 10*3/uL — ABNORMAL LOW (ref 4.0–10.5)

## 2015-08-23 LAB — MAGNESIUM: MAGNESIUM: 2.3 mg/dL (ref 1.7–2.4)

## 2015-08-23 MED ORDER — POTASSIUM CHLORIDE 20 MEQ/15ML (10%) PO SOLN
40.0000 meq | Freq: Two times a day (BID) | ORAL | Status: AC
Start: 2015-08-23 — End: 2015-08-23
  Administered 2015-08-23 (×2): 40 meq via ORAL
  Filled 2015-08-23 (×2): qty 30

## 2015-08-23 MED ORDER — TORSEMIDE 20 MG PO TABS
100.0000 mg | ORAL_TABLET | Freq: Every day | ORAL | Status: DC
  Administered 2015-08-24 – 2015-08-26 (×2): 100 mg via ORAL
  Filled 2015-08-23 (×3): qty 5

## 2015-08-23 MED ORDER — ENSURE ENLIVE PO LIQD
237.0000 mL | Freq: Two times a day (BID) | ORAL | Status: DC
  Administered 2015-08-23 – 2015-08-28 (×10): 237 mL via ORAL

## 2015-08-23 NOTE — Progress Notes (Signed)
Subjective:  Feeling better today.  Appreciate internal medicine consult who started Zosyn yesterday.  Currently sitting up at side of bed and feels well.  No temperature elevation last night.  White count was low.  Her Cytoxan was stopped yesterday by renal.  Edema is currently down although weight hasn't changed appreciably.  Not currently short of breath at rest.  Objective:  Vital Signs in the last 24 hours: BP 157/71 mmHg  Pulse 88  Temp(Src) 99 F (37.2 C) (Oral)  Resp 16  Ht 5' (1.524 m)  Wt 63.458 kg (139 lb 14.4 oz)  BMI 27.32 kg/m2  SpO2 95%  Physical Exam: Elderly female in no acute distress  Lungs:  Clear  Cardiac:  Regular rhythm, normal S1 and S2, no S3 Abdomen:  Soft, nontender, no masses Extremities:  1-2+ edema present  Intake/Output from previous day: 09/27 0701 - 09/28 0700 In: 990 [P.O.:840; IV Piggyback:150] Out: 1000 [Urine:1000] Weight Filed Weights   08/21/15 0434 08/22/15 0316 08/23/15 0332  Weight: 64.32 kg (141 lb 12.8 oz) 63.458 kg (139 lb 14.4 oz) 63.458 kg (139 lb 14.4 oz)    Lab Results: Basic Metabolic Panel:  Recent Labs  08/22/15 1452 08/23/15 0515  NA 137 137  K 2.8* 3.1*  CL 105 103  CO2 21* 26  GLUCOSE 178* 101*  BUN 42* 44*  CREATININE 1.80* 1.91*    CBC:  Recent Labs  08/22/15 0312 08/23/15 0515  WBC 3.0* 3.6*  HGB 9.7* 9.7*  HCT 29.4* 29.7*  MCV 90.5 89.7  PLT 224 242    BNP    Component Value Date/Time   BNP 270.4* 08/18/2015 1810   Telemetry: Sinus with PVCs and PACs  Assessment/Plan:  1.  Fever of uncertain etiology improved following addition of antibodies currently compensated  2.  Anemia currently improved  3.  Chronic diastolic heart failure 4.  Severe nephrotic syndrome and nephritis with albumin of less than 1 5.  Hypokalemia  Recommendations:  She feels better and asks about going home.  Afebrile on intravenous antibotcs..  Will defer this to internal medicine as she was immunosuppressed  and may have early sepsis.  Probably would be better to keep one additional day.  Replete potassium.  At discharge she will need to go home on a lower dose of Synthroid then she was on admission at 0.088.    W. Doristine Church  MD [facility] Card[facility]  08/23/2015, 8:48 AM

## 2015-08-23 NOTE — Progress Notes (Signed)
[facility] Progress Note    Assessment/ Plan:   1. CKD Stage IV/Membranous GN/Nephrotic Syndrome: Edema improved. She made 1L of urine over last 24 hours. Cr 1.9 from 1.84 yesterday (baseline around 1.7). On Lasix 80mg  IV q8hr. Transition to home PO torsemide. On Prednisone 10 mg daily. Cytoxan discontinued. No emergent HD needs today. Will need to follow up with Dr. Arty Baumgartner.  2. Fever: Fever to 102.5 at 3AM on 9/27. Afebrile in last 24 hours. Leukopenia improved to 3.6 from 3. Blood culture and urine culture pending. CXR with left base atelectasis. Urinalysis >300 protein, negative nitrite or leukocytes, 11-20 WBC and few bacteria. Will discontinue cytoxan. On Zosyn. 2. HTN: BP in 161W-960A systolic. On Coreg 3.125 mg BID.  3. Anemia: Hgb stable. Received 1u pRBC 9/26. Iron low normal at 29. Saturation unable to be calculated. May need increase in EPO.  4. Hypomagnesemia: Resolved  Subjective:   She reports she is doing well today. Denies dyspnea. She notes her edema has improved.   Objective:   BP 134/60 mmHg  Pulse 92  Temp(Src) 99.1 F (37.3 C) (Oral)  Resp 16  Ht 5' (1.524 m)  Wt 139 lb 14.4 oz (63.458 kg)  BMI 27.32 kg/m2  SpO2 93%  Intake/Output Summary (Last 24 hours) at 08/23/15 1150 Last data filed at 08/23/15 0853  Gross per 24 hour  Intake    750 ml  Output    550 ml  Net    200 ml   Weight change: 0 lb (0 kg)  Physical Exam: Gen: elderly woman resting in bed CVS: RRR, 2/6 systolic murmur Resp: CTA bilaterally, breaths non-labored at rest Abd: BS+, soft, non-tender Ext: 1+ pitting edema in lower extremities   Imaging: Dg Chest 2 View  08/22/2015   CLINICAL DATA:  Fever with sweats for 1 day  EXAM: CHEST  2 VIEW  COMPARISON:  August 18, 2015  FINDINGS: There is mild atelectasis in the left base. Lungs elsewhere clear. Heart size and pulmonary vascularity are normal. No adenopathy. There is degenerative change in the thoracic spine. There  is calcification in both carotid arteries.  IMPRESSION: Left base atelectasis. No frank edema or consolidation. Calcification in each carotid artery.   Electronically Signed   By: Lowella Grip III M.D.   On: 08/22/2015 18:36    Labs: BMET  Recent Labs Lab 08/18/15 1810 08/19/15 0815 08/20/15 0300 08/21/15 0328 08/22/15 1452 08/23/15 0515  NA 138 138 138 140 137 137  K 3.4* 3.4* 3.4* 3.9 2.8* 3.1*  CL 107 108 107 109 105 103  CO2 23 22 24 25  21* 26  GLUCOSE 191* 110* 123* 110* 178* 101*  BUN 39* 39* 39* 39* 42* 44*  CREATININE 2.06* 1.96* 2.02* 1.84* 1.80* 1.91*  CALCIUM 7.5* 7.4* 7.3* 7.6* 7.5* 7.6*   CBC  Recent Labs Lab 08/18/15 1810  08/20/15 0300 08/21/15 0328 08/22/15 0312 08/23/15 0515  WBC 3.9*  < > 3.4* 3.7* 3.0* 3.6*  NEUTROABS 3.5  --   --   --   --   --   HGB 8.3*  < > 7.8* 7.6* 9.7* 9.7*  HCT 26.4*  < > 24.1* 24.0* 29.4* 29.7*  MCV 94.6  < > 95.3 94.9 90.5 89.7  PLT 249  < > 224 245 224 242  < > = values in this interval not displayed.  Medications:    . aspirin EC  81 mg Oral Daily  . atorvastatin  20 mg Oral Daily  .  carvedilol  3.125 mg Oral BID WC  . ezetimibe  10 mg Oral Daily  . furosemide  40 mg Intravenous Once  . furosemide  80 mg Intravenous Q8H  . heparin  5,000 Units Subcutaneous 3 times per day  . levothyroxine  75 mcg Oral QAC breakfast  . piperacillin-tazobactam (ZOSYN)  IV  2.25 g Intravenous Q8H  . potassium chloride  40 mEq Oral BID  . predniSONE  10 mg Oral QODAY  . sodium chloride  3 mL Intravenous Q12H      Jacques Earthly, MD Internal Medicine Resident, PGY 2 08/23/2015, 11:50 AM

## 2015-08-23 NOTE — Progress Notes (Signed)
PROGRESS NOTE  Erika [name] TWS:[id number] DOB: 12/27/1927 DOA: 08/18/2015 PCP: Precious Reel, MD  Brief history A 81 year old female with a history of CAD with MI in 1990 with Single vessel disease with a severe stenosis in the RCA which had recannalized since prior study in 1990, hyperlipidemia, hypothyroidism, CKD stage III, nephrotic syndrome with biopsy-proven membranous nephropathy presented on 08/18/2015 with increasing abdominal girth, lower extremity edema, and shortness of breath. The patient was up 13 pounds above her dry weight at 146 pounds on the day of admission. The patient was started on intravenous furosemide with good clinical response. Nephrology was consulted secondary to the patient's CKD and nephrotic syndrome. The patient had been previously on Cytoxan and prednisone which she did not start taking until July 2016. She was recently discharged in August 2016 secondary to syncope. At that time, her diuretics and ARB were held. She was hydrated and improved clinically. At the time of discharge, the patient's torsemide was started at a lower dose, 80 mg every afternoon, 100 mg every morning  Unfortunately, he began developing fevers on 08/19/2015 up to 101.54F. Assessment/Plan: Fever/leukopenia/lactic acidosis-->Sepsis -Likely secondary to urinary source -Unfortunately, the patient has been on Cytoxan which may be blunting her inflammatory response -fever curve improved on IV Zosyn -Cytoxan was discontinued after her 08/22/2015 dose -Continue Zosyn pending final culture data  -Lactic acid 2.6 --> 2.0  -Procalcitonin 0.38  -CXR without consolidation UTI  -Continue Zosyn but pending final culture data  -narrow abx if no other organisms other than Group B strept Acute on chronic renal failure (CKD 4)/ nephrotic syndrome/membranous glomerulonephritis -per nephrology -continue prednisone  -Continue furosemide IV Acute on chronic diastolic CHF -Baseline weight at  approximately 132 -furosemide per cardiology  -Continue carvedilol -NEG 4 pounds since admission Leukopenia -Secondary to Cytoxan--last dose given 08/22/2015 Elevated troponin -Secondary to demand ischemia in the setting of decompensated CHF and worsening renal function Anemia CKD  -Received 1 unit PRBC 08/21/2015  Hypothyroidism  -TSH 1.855  -Continue Synthroid  Hyperlipidemia  -Continue statin  CAD with history of MI -Continue aspirin Hypokalemia -Replete  Family Communication:   Husband updated at beside Disposition Plan:   Home when cleared by cardiology and renal       Procedures/Studies: Dg Chest 2 View  08/22/2015   CLINICAL DATA:  Fever with sweats for 1 day  EXAM: CHEST  2 VIEW  COMPARISON:  August 18, 2015  FINDINGS: There is mild atelectasis in the left base. Lungs elsewhere clear. Heart size and pulmonary vascularity are normal. No adenopathy. There is degenerative change in the thoracic spine. There is calcification in both carotid arteries.  IMPRESSION: Left base atelectasis. No frank edema or consolidation. Calcification in each carotid artery.   Electronically Signed   By: Lowella Grip III M.D.   On: 08/22/2015 18:36   Dg Chest 2 View  08/18/2015   CLINICAL DATA:  Shortness of breath.  Symptoms today.  EXAM: CHEST  2 VIEW  COMPARISON:  07/17/2015  FINDINGS: Linear atelectasis in the lingula. Right lung is clear. Heart is upper limits normal in size. No effusions. No acute bony abnormality.  IMPRESSION: Lingular atelectasis.   Electronically Signed   By: Rolm Baptise M.D.   On: 08/18/2015 18:08         Subjective: Patient still has some dyspnea on exertion but denies any chest pain, coughing, hemoptysis, fevers, chills, nausea, vomiting, diarrhea, abdominal pain, dysuria, hematuria. No rashes.  Objective: Filed Vitals:   08/22/15 1956 08/23/15 0332 08/23/15 0426 08/23/15 1122  BP: 123/58  157/71 134/60  Pulse: 84  88 92  Temp: 98.2 F (36.8 C)   99 F (37.2 C) 99.1 F (37.3 C)  TempSrc: Oral  Oral Oral  Resp: 16  16 16   Height:      Weight:  63.458 kg (139 lb 14.4 oz)    SpO2: 94%  95% 93%    Intake/Output Summary (Last 24 hours) at 08/23/15 1541 Last data filed at 08/23/15 0853  Gross per 24 hour  Intake    750 ml  Output    550 ml  Net    200 ml   Weight change: 0 kg (0 lb) Exam:   General:  Pt is alert, follows commands appropriately, not in acute distress  HEENT: No icterus, No thrush, No neck mass, [place]/AT  Cardiovascular: RRR, S1/S2, no rubs, no gallops  Respiratory: Bibasilar crackles. No wheezing. Good air movement.  Abdomen: Soft/+BS, non tender, non distended, no guarding  Extremities: 1+ LE edema, No lymphangitis, No petechiae, No rashes, no synovitis  Data Reviewed: Basic Metabolic Panel:  Recent Labs Lab 08/19/15 0815 08/20/15 0300 08/21/15 0328 08/22/15 1452 08/23/15 0515  NA 138 138 140 137 137  K 3.4* 3.4* 3.9 2.8* 3.1*  CL 108 107 109 105 103  CO2 22 24 25  21* 26  GLUCOSE 110* 123* 110* 178* 101*  BUN 39* 39* 39* 42* 44*  CREATININE 1.96* 2.02* 1.84* 1.80* 1.91*  CALCIUM 7.4* 7.3* 7.6* 7.5* 7.6*  MG  --  1.6*  --   --  2.3   Liver Function Tests:  Recent Labs Lab 08/18/15 2125 08/22/15 1452  AST 27 35  ALT 15 15  ALKPHOS 52 51  BILITOT 0.3 0.2*  PROT 3.8* 3.8*  ALBUMIN <1.0* <1.0*   No results for input(s): LIPASE, AMYLASE in the last 168 hours. No results for input(s): AMMONIA in the last 168 hours. CBC:  Recent Labs Lab 08/18/15 1810 08/19/15 0229 08/20/15 0300 08/21/15 0328 08/22/15 0312 08/23/15 0515  WBC 3.9* 3.7* 3.4* 3.7* 3.0* 3.6*  NEUTROABS 3.5  --   --   --   --   --   HGB 8.3* 7.5* 7.8* 7.6* 9.7* 9.7*  HCT 26.4* 23.4* 24.1* 24.0* 29.4* 29.7*  MCV 94.6 94.7 95.3 94.9 90.5 89.7  PLT 249 226 224 245 224 242   Cardiac Enzymes:  Recent Labs Lab 08/18/15 2125 08/19/15 0229 08/19/15 0815  TROPONINI 0.23* 0.21* 0.23*   BNP: Invalid input(s):  POCBNP CBG: No results for input(s): GLUCAP in the last 168 hours.  Recent Results (from the past 240 hour(s))  Culture, blood (routine x 2)     Status: None (Preliminary result)   Collection Time: 08/22/15  2:50 PM  Result Value Ref Range Status   Specimen Description BLOOD RIGHT HAND  Final   Special Requests BOTTLES DRAWN AEROBIC ONLY  2CC  Final   Culture NO GROWTH < 24 HOURS  Final   Report Status PENDING  Incomplete  Urine culture     Status: None (Preliminary result)   Collection Time: 08/22/15  3:46 PM  Result Value Ref Range Status   Specimen Description URINE, CLEAN CATCH  Final   Special Requests Normal  Final   Culture   Final    40,000 COLONIES/ml GROUP B STREP(S.AGALACTIAE)ISOLATED TESTING AGAINST S. AGALACTIAE NOT ROUTINELY PERFORMED DUE TO PREDICTABILITY OF AMP/PEN/VAN SUSCEPTIBILITY. 10,000 [place]  Report Status PENDING  Incomplete     Scheduled Meds: . aspirin EC  81 mg Oral Daily  . atorvastatin  20 mg Oral Daily  . carvedilol  3.125 mg Oral BID WC  . ezetimibe  10 mg Oral Daily  . feeding supplement (ENSURE ENLIVE)  237 mL Oral BID BM  . furosemide  80 mg Intravenous Q8H  . heparin  5,000 Units Subcutaneous 3 times per day  . levothyroxine  75 mcg Oral QAC breakfast  . piperacillin-tazobactam (ZOSYN)  IV  2.25 g Intravenous Q8H  . potassium chloride  40 mEq Oral BID  . predniSONE  10 mg Oral QODAY  . sodium chloride  3 mL Intravenous Q12H  . [START ON 08/24/2015] torsemide  100 mg Oral Daily   Continuous Infusions:    TAT, DAVID, DO  [facility] Pager [phone]  If 7PM-7AM, please contact night-coverage [email or web address] Password [email or web address] 08/23/2015, 3:41 PM   LOS: 5 days

## 2015-08-23 NOTE — Evaluation (Addendum)
Physical Therapy Evaluation Patient Details Name: Erika [name] MRN: [id number] DOB: March 20, 1928 Today's Date: 08/23/2015   History of Present Illness  81 year old female with hx of CAD with MI in 1990 last cath 1995 with single vessel disease and severe stenosis in the RCA which had recannalized since prior study in 1990, HTN, HLD, hypothyroidism, membranous nephropathy, most recent ECHO with EF 60-65% G1DD, presented on 9/23/2016under cardiology service for evaluation of progressively worsening LE edema. She was diureses and continued to improve but has developed fevers of 102.5 F  Clinical Impression  Patient participated in limited eval due to fatigue with not sleeping in hospital.  She presents with decreased independence with mobility due to generalized weakness but denies need for follow up PT at d/c as she will be returning to work.  Will benefit from skilled PT in the acute setting to allow return to home.  Has RW at home and plans to use as needed for mobility till getting back to "normal".  Also briefly educated pt in fall prevention for home.    Follow Up Recommendations No PT follow up;Supervision - Intermittent    Equipment Recommendations  None recommended by PT    Recommendations for Other Services       Precautions / Restrictions Precautions Precautions: Fall      Mobility  Bed Mobility Overal bed mobility: Needs Assistance Bed Mobility: Supine to Sit     Supine to sit: Min assist     General bed mobility comments: assist with rail and pulling up with assist, sit to supine supervision, assist to scoot to head of bed  Transfers Overall transfer level: Needs assistance Equipment used: None Transfers: Sit to/from Stand Sit to Stand: Supervision            Ambulation/Gait Ambulation/Gait assistance: Min guard;Supervision Ambulation Distance (Feet): 30 Feet Assistive device: None Gait Pattern/deviations: Step-through pattern;Decreased stride length      General Gait Details: occasional touching surfaces in the room for balance, limited to in room due to pt c/o fatigue, not sleeping well here; loss of balance x 1 on turn min assist to recover  Stairs            Wheelchair Mobility    Modified Rankin (Stroke Patients Only)       Balance Overall balance assessment: Needs assistance           Standing balance-Leahy Scale: Fair Standing balance comment: stands static independent, but needs occ UE support for ambulation                             Pertinent Vitals/Pain Pain Assessment: No/denies pain    Home Living Family/patient expects to be discharged to:: Private residence Living Arrangements: Spouse/significant other (72 y/o w/ MR) Available Help at Discharge: Family Type of Home: House Home Access: Stairs to enter Entrance Stairs-Rails: Right Entrance Stairs-Number of Steps: 2 Home Layout: One level Home Equipment: Walker - 2 wheels;Cane - single point      Prior Function Level of Independence: Independent         Comments: 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  Patient states goes to work, comes home and takes a nap, eats dinner then back to sleep till [place]        Extremity/Trunk Assessment               Lower Extremity Assessment: RLE deficits/detail;LLE  deficits/detail RLE Deficits / Details: WFL except hip flexion 4-/5 LLE Deficits / Details: WFL except hip flexion 4-/5     Communication   Communication: No difficulties  Cognition Arousal/Alertness: Awake/alert Behavior During Therapy: WFL for tasks assessed/performed Overall Cognitive Status: Within Functional Limits for tasks assessed                      General Comments      Exercises        Assessment/Plan    PT Assessment Patient needs continued PT services  PT Diagnosis Generalized weakness   PT Problem List Decreased strength;Decreased activity tolerance;Decreased  balance;Decreased mobility;Decreased safety awareness  PT Treatment Interventions DME instruction;Balance training;Gait training;Functional mobility training;Patient/family education;Stair training;Therapeutic activities;Therapeutic exercise   PT Goals (Current goals can be found in the Care Plan section) Acute Rehab PT Goals Patient Stated Goal: To go home, return to work when able PT Goal Formulation: With patient Time For Goal Achievement: 08/30/15 Potential to Achieve Goals: Good    Frequency Min 3X/week   Barriers to discharge        Co-evaluation               End of Session   Activity Tolerance: Patient limited by fatigue Patient left: in bed;with bed alarm set           Time: 1135-1200 PT Time Calculation (min) (ACUTE ONLY): 25 min   Charges:   PT Evaluation $Initial PT Evaluation Tier I: 1 Procedure PT Treatments $Gait Training: 8-22 mins   PT G Codes:        Erika [name],Erika [name], 12:34 PM  Erika [name], Erika [name]

## 2015-08-23 NOTE — Progress Notes (Addendum)
Initial Nutrition Assessment   INTERVENTION:  Provide Ensure Enlive BID, each supplement provides 350 kcal and 20 grams of protein   NUTRITION DIAGNOSIS:   Inadequate oral intake related to poor appetite as evidenced by per patient/family report, percent weight loss.   GOAL:   Patient will meet greater than or equal to 90% of their needs   MONITOR:   PO intake, Labs, Weight trends, Skin, I & O's  REASON FOR ASSESSMENT:   Consult Assessment of nutrition requirement/status  ASSESSMENT:   81 year old female with hx of CAD with MI in 1990, single vessel disease and severe stenosis, HTN, HLD, hypothyroidism, membranous nephropathy, most recent ECHO with EF 60-65% , presented on 9/23/2016under cardiology service for evaluation of progressively worsening LE edema.  Pt states that she has a poor appetite which is normal for her. She usually eats 3 small meals daily, half a sandwich or equivalent per meal. She drinks Ensure supplements 2-3 times daily to keep strength and weight up. Per nursing notes, pt is eating 50-100% of most meals, 10-45% of some. She states that she usually weighs 137 to 140 lbs but, recently lost down to 132 lbs; pt currently has edema/wt gain.   Labs: low potassium, elevated BUN/Creatinine, low GFR  Diet Order:  Diet 2 gram sodium Room service appropriate?: Yes; Fluid consistency:: Thin  Skin:  Reviewed, no issues  Last BM:  9/27  Height:   Ht Readings from Last 1 Encounters:  08/18/15 5' (1.524 m)    Weight:   Wt Readings from Last 1 Encounters:  08/23/15 139 lb 14.4 oz (63.458 kg)    Ideal Body Weight:  45.5 kg  BMI:  Body mass index is 27.32 kg/(m^2).  Estimated Nutritional Needs:   Kcal:  1400-1600  Protein:  50-60 grams  Fluid:  1.4-1.6 L/day  EDUCATION NEEDS:   No education needs identified at this time  [place], LDN Inpatient Clinical Dietitian Pager: [phone] After Hours Pager: [phone]

## 2015-08-24 ENCOUNTER — Inpatient Hospital Stay (HOSPITAL_COMMUNITY): Payer: Medicare Other

## 2015-08-24 DIAGNOSIS — R509 Fever, unspecified: Secondary | ICD-10-CM

## 2015-08-24 LAB — BASIC METABOLIC PANEL
ANION GAP: 7 (ref 5–15)
BUN: 43 mg/dL — ABNORMAL HIGH (ref 6–20)
CALCIUM: 7.5 mg/dL — AB (ref 8.9–10.3)
CO2: 26 mmol/L (ref 22–32)
Chloride: 107 mmol/L (ref 101–111)
Creatinine, Ser: 1.88 mg/dL — ABNORMAL HIGH (ref 0.44–1.00)
GFR, EST AFRICAN AMERICAN: 27 mL/min — AB (ref >60)
GFR, EST NON AFRICAN AMERICAN: 23 mL/min — AB (ref >60)
GLUCOSE: 100 mg/dL — AB (ref 65–99)
Potassium: 4 mmol/L (ref 3.5–5.1)
SODIUM: 140 mmol/L (ref 135–145)

## 2015-08-24 LAB — URINE CULTURE
Culture: 40000
Special Requests: NORMAL

## 2015-08-24 LAB — C DIFFICILE QUICK SCREEN W PCR REFLEX
C DIFFICLE (CDIFF) ANTIGEN: NEGATIVE
C Diff interpretation: NEGATIVE
C Diff toxin: NEGATIVE

## 2015-08-24 LAB — PROCALCITONIN: PROCALCITONIN: 0.44 ng/mL

## 2015-08-24 LAB — MAGNESIUM: Magnesium: 2 mg/dL (ref 1.7–2.4)

## 2015-08-24 MED ORDER — DEXTROSE 5 % IV SOLN
1.0000 g | INTRAVENOUS | Status: DC
  Administered 2015-08-24 – 2015-08-27 (×4): 1 g via INTRAVENOUS
  Filled 2015-08-24 (×5): qty 10

## 2015-08-24 MED ORDER — IOHEXOL 300 MG/ML  SOLN
50.0000 mL | INTRAMUSCULAR | Status: AC
  Administered 2015-08-24 (×2): 50 mL via ORAL

## 2015-08-24 NOTE — Progress Notes (Addendum)
Subjective:  Says that she feels good today and has more energy than when she came in.  She denies shortness of breath or chest pain.  Did have recurrent fever last night and is running low-grade fever this morning.  Renal function is reasonably stable.  Potassium repleted overnight.  Objective:  Vital Signs in the last 24 hours: BP 165/76 mmHg  Pulse 86  Temp(Src) 100 F (37.8 C) (Oral)  Resp 16  Ht 5' (1.524 m)  Wt 63.322 kg (139 lb 9.6 oz)  BMI 27.26 kg/m2  SpO2 92%  Physical Exam: Elderly female in no acute distress  Lungs:  Clear  Cardiac:  Regular rhythm, normal S1 and S2, no S3 Abdomen:  Soft, nontender, no masses Extremities:  1-2+ edema present  Intake/Output from previous day: 09/28 0701 - 09/29 0700 In: 290 [P.O.:240; IV Piggyback:50] Out: 750 [Urine:750] Weight Filed Weights   08/22/15 0316 08/23/15 0332 08/24/15 0443  Weight: 63.458 kg (139 lb 14.4 oz) 63.458 kg (139 lb 14.4 oz) 63.322 kg (139 lb 9.6 oz)    Lab Results: Basic Metabolic Panel:  Recent Labs  08/23/15 0515 08/24/15 0524  NA 137 140  K 3.1* 4.0  CL 103 107  CO2 26 26  GLUCOSE 101* 100*  BUN 44* 43*  CREATININE 1.91* 1.88*    CBC:  Recent Labs  08/22/15 0312 08/23/15 0515  WBC 3.0* 3.6*  HGB 9.7* 9.7*  HCT 29.4* 29.7*  MCV 90.5 89.7  PLT 224 242    BNP    Component Value Date/Time   BNP 270.4* 08/18/2015 1810   Telemetry: Sinus with PVCs and PACs  Assessment/Plan:  1.  Fever of uncertain etiology may be due to urinary tract source-continued fever to be managed by internal medicine  2.  Anemia currently improved  3.  Chronic diastolic heart failure 4.  Severe nephrotic syndrome and nephritis with albumin of less than 1 5.  Hypokalemia-resolved  Recommendations:  Since she is still febrile timing of discharge determined by internal medicine consultant.  Continue intravenous antibiotics.  Kerry Hough  MD [facility]  08/24/2015, 9:03 AM

## 2015-08-24 NOTE — Progress Notes (Signed)
Physical Therapy Treatment Patient Details Name: Erika [name] MRN: [id number] DOB: 04/08/1928 Today's Date: 08/24/2015    History of Present Illness 81 year old female with hx of CAD with MI in 1990 last cath 1995 with single vessel disease and severe stenosis in the RCA which had recannalized since prior study in 1990, HTN, HLD, hypothyroidism, membranous nephropathy, most recent ECHO with EF 60-65% G1DD, presented on 9/23/2016under cardiology service for evaluation of progressively worsening LE edema. She was diureses and continued to improve but has developed fevers of 102.5 F    PT Comments    Patient in bed, agreeable to participate in PT today after much convincing, agreeable only to limited participation (walk to door and back). Patient was able to ambulate and transfer as described below, mostly with assistance of husband who is very eager and able to help. Patient will benefit from continued PT per agreeability to improve her ambulatory endurance and return her to her previously independent lifestyle and work. Patient does not feel the need for continued PT services once discharged due to prior level of function and husband's ability to provide what assistance she needs.    Follow Up Recommendations  No PT follow up;Supervision - Intermittent;Supervision for mobility/OOB     Equipment Recommendations  None recommended by PT (Patient denies any need for equipment.)    Recommendations for Other Services       Precautions / Restrictions Precautions Precautions: Fall Restrictions Weight Bearing Restrictions: No    Mobility  Bed Mobility Overal bed mobility: Needs Assistance Bed Mobility: Supine to Sit;Sit to Supine     Supine to sit: Min assist (Husband provided necessary assistance with ease.) Sit to supine: Min assist   General bed mobility comments: She was able to scoot LE to EOB, husband assisted with trunk elevation. She was able to lie down in bed without  assistance, but required min A to scoot to [facility].    Transfers Overall transfer level: Needs assistance Equipment used: 1 person hand held assist Transfers: Sit to/from Stand Sit to Stand: Min guard         General transfer comment: Husband able to provide necessary assistance for patient to stand, therapy with supervision to assist husband.   Ambulation/Gait Ambulation/Gait assistance: Min guard Ambulation Distance (Feet): 12 Feet Assistive device: 1 person hand held assist Gait Pattern/deviations: Step-through pattern;Decreased stride length;Shuffle Gait velocity: Decreased Gait velocity interpretation: <1.8 ft/sec, indicative of risk for recurrent falls General Gait Details: Held husband's hand throughout, no LOB noted. Patient did not want to go into hallway due to being very cold and tired, not sleeping well in the hospital.   Stairs            Wheelchair Mobility    Modified Rankin (Stroke Patients Only)       Balance Overall balance assessment: Needs assistance Sitting-balance support: Feet unsupported;Bilateral upper extremity supported (Feet unable to touch ground on lowest bed setting.) Sitting balance-Leahy Scale: Good       Standing balance-Leahy Scale: Fair                      Cognition Arousal/Alertness: Lethargic Behavior During Therapy: WFL for tasks assessed/performed Overall Cognitive Status: Within Functional Limits for tasks assessed                      Exercises      General Comments        Pertinent Vitals/Pain Pain Assessment: No/denies pain  Home Living                      Prior Function            PT Goals (current goals can now be found in the care plan section) Acute Rehab PT Goals Patient Stated Goal: To go home, return to work when able PT Goal Formulation: With patient Time For Goal Achievement: 08/30/15 Potential to Achieve Goals: Fair Progress towards PT goals: Not progressing toward  goals - comment (Patient with minimal participation in therapy.)    Frequency  Min 3X/week    PT Plan Current plan remains appropriate    Co-evaluation             End of Session   Activity Tolerance: Patient limited by fatigue;Patient limited by lethargy Patient left: in bed;with call bell/phone within reach;with family/visitor present     Time: 1005-1018 PT Time Calculation (min) (ACUTE ONLY): 13 min  Charges:  $Gait Training: 8-22 mins                    G CodesRoanna [name], SPT [phone] 08/24/2015, 11:37 AM  I have read, reviewed and agree with student's note.   Erika [name] [phone] (pager)

## 2015-08-24 NOTE — Progress Notes (Signed)
PROGRESS NOTE  Erika [name]:[id number] DOB: Jun 20, 1928 DOA: 08/18/2015 PCP: Precious Reel, MD Brief history A 81 year old female with a history of CAD with MI in 1990 with Single vessel disease with a severe stenosis in the RCA which had recannalized since prior study in 1990, hyperlipidemia, hypothyroidism, CKD stage III, nephrotic syndrome with biopsy-proven membranous nephropathy presented on 08/18/2015 with increasing abdominal girth, lower extremity edema, and shortness of breath. The patient was up 13 pounds above her dry weight at 146 pounds on the day of admission. The patient was started on intravenous furosemide with good clinical response. Nephrology was consulted secondary to the patient's CKD and nephrotic syndrome. The patient had been previously on Cytoxan and prednisone which she did not start taking until July 2016. She was recently discharged in August 2016 secondary to syncope. At that time, her diuretics and ARB were held. She was hydrated and improved clinically. At the time of discharge, the patient's torsemide was started at a lower dose, 80 mg every afternoon, 100 mg every morning Unfortunately, he began developing fevers on 08/19/2015 up to 101.61F. Assessment/Plan: Fever/leukopenia/lactic acidosis-->Sepsis -still with fevers despite abx -Unfortunately, the patient has been on Cytoxan which may be blunting her inflammatory response -fever persistent -Cytoxan was discontinued after her 08/22/2015 dose -Lactic acid 2.6 --> 2.0  -Procalcitonin 0.38 -->0.44  -CXR without consolidation -CT chest -CT abd/pelvis without IV contrast -c diff screen as pt has had 4 loose stools in past 24 hours -no meningismus on exam -lower extremity duplex r/o DVT -CBC with diff and LFTs in am UTI  -d/c zosyn -start ceftriaxone -inflammatory response, pyuria may be blunted by pt on cytoxan Acute on chronic renal failure (CKD 4)/ nephrotic syndrome/membranous  glomerulonephritis -per nephrology -continue prednisone  -furosemide IV-->po torsemide Acute on chronic diastolic CHF -Baseline weight at approximately 132 -furosemide per cardiology  -Continue carvedilol -NEG 4 pounds since admission Leukopenia -Secondary to Cytoxan--last dose given 08/22/2015 Elevated troponin -Secondary to demand ischemia in the setting of decompensated CHF and worsening renal function Anemia CKD  -Received 1 unit PRBC 08/21/2015  Hypothyroidism  -TSH 1.855  -Continue Synthroid  Hyperlipidemia  -Continue statin  CAD with history of MI -Continue aspirin Hypokalemia -Replete  Family Communication: Husband updated at beside Disposition Plan: Home when cleared by cardiology and renal   Procedures/Studies: Dg Chest 2 View  08/22/2015   CLINICAL DATA:  Fever with sweats for 1 day  EXAM: CHEST  2 VIEW  COMPARISON:  August 18, 2015  FINDINGS: There is mild atelectasis in the left base. Lungs elsewhere clear. Heart size and pulmonary vascularity are normal. No adenopathy. There is degenerative change in the thoracic spine. There is calcification in both carotid arteries.  IMPRESSION: Left base atelectasis. No frank edema or consolidation. Calcification in each carotid artery.   Electronically Signed   By: Lowella Grip III M.D.   On: 08/22/2015 18:36   Dg Chest 2 View  08/18/2015   CLINICAL DATA:  Shortness of breath.  Symptoms today.  EXAM: CHEST  2 VIEW  COMPARISON:  07/17/2015  FINDINGS: Linear atelectasis in the lingula. Right lung is clear. Heart is upper limits normal in size. No effusions. No acute bony abnormality.  IMPRESSION: Lingular atelectasis.   Electronically Signed   By: Rolm Baptise M.D.   On: 08/18/2015 18:08         Subjective: Patient denies fevers, chills, headache, chest pain, dyspnea, nausea, vomiting, diarrhea, abdominal pain,  dysuria, hematuria; no hematochezia or melena.  4 loose stools in past 24  hours  Objective: Filed Vitals:   08/24/15 0800 08/24/15 1000 08/24/15 1250 08/24/15 1400  BP: 165/76 145/61 130/83 116/59  Pulse: 86 96 102 96  Temp: 100 F (37.8 C) 102 F (38.9 C) 99.7 F (37.6 C) 98.9 F (37.2 C)  TempSrc: Oral Oral Oral Oral  Resp:   18 24  Height:      Weight:      SpO2: 92% 95% 98% 89%    Intake/Output Summary (Last 24 hours) at 08/24/15 1513 Last data filed at 08/24/15 1400  Gross per 24 hour  Intake    551 ml  Output    375 ml  Net    176 ml   Weight change: -0.136 kg (-4.8 oz) Exam:   General:  Pt is alert, follows commands appropriately, not in acute distress  HEENT: No icterus, No thrush, No neck mass, [place]/AT; no meningismus   Cardiovascular: RRR, S1/S2, no rubs, no gallops  Respiratory: fine bibasilar crackles   Abdomen: Soft/+BS, non tender, non distended, no guarding; no hepatosplenomegaly   Extremities: trace LE edema, No lymphangitis, No petechiae, No rashes, no synovitis; no clubbing or cyanosis   Data Reviewed: Basic Metabolic Panel:  Recent Labs Lab 08/20/15 0300 08/21/15 0328 08/22/15 1452 08/23/15 0515 08/24/15 0524  NA 138 140 137 137 140  K 3.4* 3.9 2.8* 3.1* 4.0  CL 107 109 105 103 107  CO2 24 25 21* 26 26  GLUCOSE 123* 110* 178* 101* 100*  BUN 39* 39* 42* 44* 43*  CREATININE 2.02* 1.84* 1.80* 1.91* 1.88*  CALCIUM 7.3* 7.6* 7.5* 7.6* 7.5*  MG 1.6*  --   --  2.3 2.0   Liver Function Tests:  Recent Labs Lab 08/18/15 2125 08/22/15 1452  AST 27 35  ALT 15 15  ALKPHOS 52 51  BILITOT 0.3 0.2*  PROT 3.8* 3.8*  ALBUMIN <1.0* <1.0*   No results for input(s): LIPASE, AMYLASE in the last 168 hours. No results for input(s): AMMONIA in the last 168 hours. CBC:  Recent Labs Lab 08/18/15 1810 08/19/15 0229 08/20/15 0300 08/21/15 0328 08/22/15 0312 08/23/15 0515  WBC 3.9* 3.7* 3.4* 3.7* 3.0* 3.6*  NEUTROABS 3.5  --   --   --   --   --   HGB 8.3* 7.5* 7.8* 7.6* 9.7* 9.7*  HCT 26.4* 23.4* 24.1* 24.0*  29.4* 29.7*  MCV 94.6 94.7 95.3 94.9 90.5 89.7  PLT 249 226 224 245 224 242   Cardiac Enzymes:  Recent Labs Lab 08/18/15 2125 08/19/15 0229 08/19/15 0815  TROPONINI 0.23* 0.21* 0.23*   BNP: Invalid input(s): POCBNP CBG: No results for input(s): GLUCAP in the last 168 hours.  Recent Results (from the past 240 hour(s))  Culture, blood (routine x 2)     Status: None (Preliminary result)   Collection Time: 08/22/15  2:50 PM  Result Value Ref Range Status   Specimen Description BLOOD RIGHT HAND  Final   Special Requests BOTTLES DRAWN AEROBIC ONLY  2CC  Final   Culture NO GROWTH 2 DAYS  Final   Report Status PENDING  Incomplete  Urine culture     Status: None   Collection Time: 08/22/15  3:46 PM  Result Value Ref Range Status   Specimen Description URINE, CLEAN CATCH  Final   Special Requests Normal  Final   Culture   Final    40,000 COLONIES/ml GROUP B STREP(S.AGALACTIAE)ISOLATED TESTING AGAINST S.  AGALACTIAE NOT ROUTINELY PERFORMED DUE TO PREDICTABILITY OF AMP/PEN/VAN SUSCEPTIBILITY. 10,000 COLONIES/mL [place]    Report Status 08/24/2015 FINAL  Final   Organism ID, Bacteria KLUYVERA CRYOCRESCENS  Final      Susceptibility   Kluyvera cryocrescens - MIC*    AMPICILLIN 8 RESISTANT Resistant     CEFAZOLIN <=4 RESISTANT Resistant     CEFTRIAXONE <=1 SENSITIVE Sensitive     CIPROFLOXACIN <=0.25 SENSITIVE Sensitive     GENTAMICIN <=1 SENSITIVE Sensitive     IMIPENEM <=0.25 SENSITIVE Sensitive     NITROFURANTOIN <=16 SENSITIVE Sensitive     TRIMETH/SULFA <=20 SENSITIVE Sensitive     AMPICILLIN/SULBACTAM <=2 SENSITIVE Sensitive     * 10,000 COLONIES/mL KLUYVERA CRYOCRESCENS     Scheduled Meds: . aspirin EC  81 mg Oral Daily  . atorvastatin  20 mg Oral Daily  . carvedilol  3.125 mg Oral BID WC  . cefTRIAXone (ROCEPHIN)  IV  1 g Intravenous Q24H  . ezetimibe  10 mg Oral Daily  . feeding supplement (ENSURE ENLIVE)  237 mL Oral BID BM  . heparin  5,000 Units  Subcutaneous 3 times per day  . levothyroxine  75 mcg Oral QAC breakfast  . predniSONE  10 mg Oral QODAY  . sodium chloride  3 mL Intravenous Q12H  . torsemide  100 mg Oral Daily   Continuous Infusions:    TAT, DAVID, DO  [facility] Pager [phone]  If 7PM-7AM, please contact night-coverage [email or web address] Password [email or web address] 08/24/2015, 3:13 PM   LOS: 6 days

## 2015-08-24 NOTE — Care Management Important Message (Signed)
Important Message  Patient Details  Name: Erika [name] MRN: [id number] Date of Birth: [birth year]   Medicare Important Message Given:  Yes-third notification given    Delorse Lek 08/24/2015, 11:55 AM

## 2015-08-24 NOTE — Progress Notes (Signed)
[facility] Progress Note    Assessment/ Plan:   1. CKD Stage IV/Membranous GN/Nephrotic Syndrome: Edema improved. She made 76ml of urine over last 24 hours. Cr down to 1.88 from 1.9 yesterday (baseline around 1.7). Transitioning to home PO torsemide. On Prednisone 10 mg daily. Cytoxan discontinued. No emergent HD needs today. Will need to follow up with Dr. Arty Baumgartner.  2. UTI with sepsis: Fever to 100.5 last night. Blood culture no growth and urine culture with group b strep. CXR with left base atelectasis. Cytoxan discontinued. Abx per primary. 2. HTN: On Coreg 3.125 mg BID.  3. Anemia: Hgb stable. Received 1u pRBC 9/26. Iron low normal at 29. Saturation unable to be calculated. May need increase in EPO.   Subjective:   She reports she is doing well today. Denies dyspnea.   Objective:   BP 165/76 mmHg  Pulse 86  Temp(Src) 100 F (37.8 C) (Oral)  Resp 16  Ht 5' (1.524 m)  Wt 139 lb 9.6 oz (63.322 kg)  BMI 27.26 kg/m2  SpO2 92%  Intake/Output Summary (Last 24 hours) at 08/24/15 0851 Last data filed at 08/23/15 1330  Gross per 24 hour  Intake    290 ml  Output    750 ml  Net   -460 ml   Weight change: -4.8 oz (-0.136 kg)  Physical Exam: Gen: elderly woman resting in bed CVS: RRR, 2/6 systolic murmur Resp: CTA bilaterally, breaths non-labored at rest Abd: BS+, soft, non-tender Ext: 1+ pitting edema in lower extremities   Imaging: Dg Chest 2 View  08/22/2015   CLINICAL DATA:  Fever with sweats for 1 day  EXAM: CHEST  2 VIEW  COMPARISON:  August 18, 2015  FINDINGS: There is mild atelectasis in the left base. Lungs elsewhere clear. Heart size and pulmonary vascularity are normal. No adenopathy. There is degenerative change in the thoracic spine. There is calcification in both carotid arteries.  IMPRESSION: Left base atelectasis. No frank edema or consolidation. Calcification in each carotid artery.   Electronically Signed   By: Lowella Grip III M.D.    On: 08/22/2015 18:36    Labs: BMET  Recent Labs Lab 08/18/15 1810 08/19/15 0815 08/20/15 0300 08/21/15 0328 08/22/15 1452 08/23/15 0515 08/24/15 0524  NA 138 138 138 140 137 137 140  K 3.4* 3.4* 3.4* 3.9 2.8* 3.1* 4.0  CL 107 108 107 109 105 103 107  CO2 23 22 24 25  21* 26 26  GLUCOSE 191* 110* 123* 110* 178* 101* 100*  BUN 39* 39* 39* 39* 42* 44* 43*  CREATININE 2.06* 1.96* 2.02* 1.84* 1.80* 1.91* 1.88*  CALCIUM 7.5* 7.4* 7.3* 7.6* 7.5* 7.6* 7.5*   CBC  Recent Labs Lab 08/18/15 1810  08/20/15 0300 08/21/15 0328 08/22/15 0312 08/23/15 0515  WBC 3.9*  < > 3.4* 3.7* 3.0* 3.6*  NEUTROABS 3.5  --   --   --   --   --   HGB 8.3*  < > 7.8* 7.6* 9.7* 9.7*  HCT 26.4*  < > 24.1* 24.0* 29.4* 29.7*  MCV 94.6  < > 95.3 94.9 90.5 89.7  PLT 249  < > 224 245 224 242  < > = values in this interval not displayed.  Medications:    . aspirin EC  81 mg Oral Daily  . atorvastatin  20 mg Oral Daily  . carvedilol  3.125 mg Oral BID WC  . ezetimibe  10 mg Oral Daily  . feeding supplement (ENSURE ENLIVE)  237  mL Oral BID BM  . heparin  5,000 Units Subcutaneous 3 times per day  . levothyroxine  75 mcg Oral QAC breakfast  . piperacillin-tazobactam (ZOSYN)  IV  2.25 g Intravenous Q8H  . predniSONE  10 mg Oral QODAY  . sodium chloride  3 mL Intravenous Q12H  . torsemide  100 mg Oral Daily      Jacques Earthly, MD Internal Medicine Resident, PGY 2 08/24/2015, 8:51 AM

## 2015-08-25 ENCOUNTER — Encounter (HOSPITAL_COMMUNITY): Payer: Self-pay | Admitting: General Surgery

## 2015-08-25 DIAGNOSIS — R935 Abnormal findings on diagnostic imaging of other abdominal regions, including retroperitoneum: Secondary | ICD-10-CM

## 2015-08-25 DIAGNOSIS — R933 Abnormal findings on diagnostic imaging of other parts of digestive tract: Secondary | ICD-10-CM

## 2015-08-25 LAB — CBC WITH DIFFERENTIAL/PLATELET
BASOS PCT: 1 %
Basophils Absolute: 0 10*3/uL (ref 0.0–0.1)
Eosinophils Absolute: 0 10*3/uL (ref 0.0–0.7)
Eosinophils Relative: 1 %
HEMATOCRIT: 28.7 % — AB (ref 36.0–46.0)
HEMOGLOBIN: 9.4 g/dL — AB (ref 12.0–15.0)
LYMPHS ABS: 0.1 10*3/uL — AB (ref 0.7–4.0)
Lymphocytes Relative: 3 %
MCH: 30.1 pg (ref 26.0–34.0)
MCHC: 32.8 g/dL (ref 30.0–36.0)
MCV: 92 fL (ref 78.0–100.0)
MONOS PCT: 7 %
Monocytes Absolute: 0.2 10*3/uL (ref 0.1–1.0)
NEUTROS ABS: 3.2 10*3/uL (ref 1.7–7.7)
NEUTROS PCT: 88 %
Platelets: 231 10*3/uL (ref 150–400)
RBC: 3.12 MIL/uL — ABNORMAL LOW (ref 3.87–5.11)
RDW: 17.3 % — ABNORMAL HIGH (ref 11.5–15.5)
WBC: 3.6 10*3/uL — AB (ref 4.0–10.5)

## 2015-08-25 LAB — COMPREHENSIVE METABOLIC PANEL
ALK PHOS: 52 U/L (ref 38–126)
ALT: 79 U/L — AB (ref 14–54)
ANION GAP: 8 (ref 5–15)
AST: 102 U/L — ABNORMAL HIGH (ref 15–41)
BILIRUBIN TOTAL: 0.4 mg/dL (ref 0.3–1.2)
BUN: 44 mg/dL — ABNORMAL HIGH (ref 6–20)
CALCIUM: 7.5 mg/dL — AB (ref 8.9–10.3)
CO2: 25 mmol/L (ref 22–32)
CREATININE: 2.09 mg/dL — AB (ref 0.44–1.00)
Chloride: 106 mmol/L (ref 101–111)
GFR calc Af Amer: 24 mL/min — ABNORMAL LOW (ref >60)
GFR calc non Af Amer: 20 mL/min — ABNORMAL LOW (ref >60)
GLUCOSE: 121 mg/dL — AB (ref 65–99)
Potassium: 3.7 mmol/L (ref 3.5–5.1)
SODIUM: 139 mmol/L (ref 135–145)
TOTAL PROTEIN: 3.9 g/dL — AB (ref 6.5–8.1)

## 2015-08-25 MED ORDER — METRONIDAZOLE IN NACL 5-0.79 MG/ML-% IV SOLN
500.0000 mg | Freq: Four times a day (QID) | INTRAVENOUS | Status: DC
  Administered 2015-08-25 – 2015-08-27 (×7): 500 mg via INTRAVENOUS
  Filled 2015-08-25 (×9): qty 100

## 2015-08-25 NOTE — Consult Note (Signed)
[facility] Consult: 12:56 PM 08/25/2015  LOS: 7 days    Referring Provider: Tat  Primary Care Physician:  Precious Reel, MD Primary Gastroenterologist:  unassigned   Reason for Consultation:  Abnormal CT findings in transverse colon.    HPI: Erika [name] is a 81 y.o. female.  PMH of MI/CAD 1990.  Has never had CABG or cardiac stent. Osteopenia. Stage 3 CKD, membranous glomerulonephritis/hypertensive nephrosclerosis which is being treated with Cytoxan and prednisone. Hypothyroidism diagnosed about 6 months ago.  Recently, PMD had increased the dose of her levothyroxine. Patient presented to the ER on 9/23 after she noticed tachycardia at home.  Reports are that her heart rate was in the 150s.  She denies shortness of breath, chest pain. She has chronic lower extremity edema which actually has improved over the last several months. Labs revealed elevated troponins attributed to CHF and CKD.  Due to advanced age continued medical therapy for CAD is advised and no plans for catheterization. Hemoglobin has trended down during admission.  In 02/2015 hemoglobin ranged 10-11.4. In August it ranged 8.2-9.7.  On admission hemoglobin was 8.3, it dropped to 7.6 on hospital day 4 at which point she was transfused PRBC x 1.  Posttransfusion hemoglobin 9.7, 9.4 today. MCV normal in the 90s.  Anemia profile without evidence for iron deficiency.  Patient has had fever during admission, up to 102.5 on 9/27. CXR revealed only basilar atelectasis.  Urine grew 40 K of KLUYVERA CRYOCRESCENS.  Blood cultures with no growth at 3 days.  As part of the fever workup a non-contrast CT abdomen pelvis 08/24/15 was obtained and shows the following: focal, distal transverse colon thickening with adjacent nonspecific, mixed density material at the  Gastro-colic ligament; question sequela of prior infection/perforation. No free air. Other findings include LUL consolidation, pleural effusions, small volume ascites, mild pulmonary edema, mild anasarca, small hiatal hernia, CAD.  Patient is clear that she has never had significant abdominal pain, fevers, chills, GI upset, nausea or vomiting. She has small bowel movements about 3 times a day. She never seen blood in the stool or melanotic. She doesn't use NSAIDs.  She says her appetite is good. Since late April 2016 she has lost about 17 pounds, some of this may be due to treatment of heart failure and kidney related volume excess. No previous colonoscopy or upper endoscopy. Infectious she prefers not to undergo a colonoscopy if she can help it. She spoke with surgical PA earlier who advised her that this could possibly be a cancer which could evolve into obstruction or gross perforation and severe sepsis/illness. Patient feels like she is elderly and doesn't want further workup but is agreeable to anti-biotics. Received Zosyn 9/27 - 9/29, Rocephin initiated 9/29.  IV Flagyl was added today.     Past Medical History  Diagnosis Date  . Hypertension   . Hyperlipidemia   . Myocardial infarction 1990  . CAD (coronary artery disease)     post MI in 55  . Osteopenia   . Hypothyroidism   . Fall Sept 2012  with left zygoma fracture  . Normal nuclear stress test July 2012    No ischemia.   . Right bundle branch block   . Tachycardia- per BP machine 08/18/2015  . Acute on chronic diastolic HF (heart failure) 08/18/2015    Past Surgical History  Procedure Laterality Date  . Cardiac catheterization  10/18/1994    Single vessel disease with a severe stenosis in the RCA which had recannalized since prior study in 1990. Managed medically  . Cardiac catheterization  09/22/89  . Cataract extraction, bilateral  10/2009    Prior to Admission medications   Medication Sig Start Date End Date Taking?  Authorizing Provider  acetaminophen (TYLENOL) 325 MG tablet Take 650 mg by mouth every 6 (six) hours as needed for mild pain or moderate pain.   Yes Historical Provider, MD  atorvastatin (LIPITOR) 40 MG tablet Take 20 mg by mouth daily. 07/04/15  Yes Historical Provider, MD  cyclophosphamide (CYTOXAN) 50 MG tablet Take 1 tablet (50 mg total) by mouth daily. Give on an empty stomach 1 hour before or 2 hours after meals. 07/20/15  Yes Domenic Polite, MD  ezetimibe (ZETIA) 10 MG tablet Take 1 tablet (10 mg total) by mouth daily. 10/30/12  Yes Burtis Junes, NP  levothyroxine (SYNTHROID, LEVOTHROID) 100 MCG tablet Take 100 mcg by mouth daily before breakfast.   Yes Historical Provider, MD  metolazone (ZAROXOLYN) 2.5 MG tablet Take 2.5 mg by mouth every Monday, Wednesday, and Friday.   Yes Historical Provider, MD  predniSONE (DELTASONE) 10 MG tablet Take 10 mg by mouth every other day.    Yes Historical Provider, MD  torsemide (DEMADEX) 100 MG tablet Take 100 mg by mouth daily.   Yes Historical Provider, MD    Scheduled Meds: . aspirin EC  81 mg Oral Daily  . atorvastatin  20 mg Oral Daily  . carvedilol  3.125 mg Oral BID WC  . cefTRIAXone (ROCEPHIN)  IV  1 g Intravenous Q24H  . ezetimibe  10 mg Oral Daily  . feeding supplement (ENSURE ENLIVE)  237 mL Oral BID BM  . heparin  5,000 Units Subcutaneous 3 times per day  . levothyroxine  75 mcg Oral QAC breakfast  . predniSONE  10 mg Oral QODAY  . sodium chloride  3 mL Intravenous Q12H  . torsemide  100 mg Oral Daily   Infusions:   PRN Meds: sodium chloride, acetaminophen, ondansetron (ZOFRAN) IV, sodium chloride   Allergies as of 08/18/2015  . (No Known Allergies)    Family History  Problem Relation Age of Onset  . Heart attack Father 85  . Heart attack Mother 47  . Heart disease Brother   . Heart disease Brother     Social History   Social History  . Marital Status: Married    Spouse Name: N/A  . Number of Children: N/A  . Years  of Education: N/A   Occupational History  . Not on file.   Social History Main Topics  . Smoking status: Never Smoker   . Smokeless tobacco: Never Used  . Alcohol Use: No  . Drug Use: No  . Sexual Activity: Yes   Other Topics Concern  . Not on file   Social History Narrative    REVIEW OF SYSTEMS: Constitutional:  Patient feels well. She still works part-time as a Research scientist (physical sciences) and go to person at [employer] and [employer] home ENT:  No nose bleeds Pulm:  Has had shortness of breath for a long time.  It had not recently worsened. Nonproductive cough. CV:  Stable, minor lower extremity edema. No chest pain.  GU:  No hematuria, no frequency, no oliguria  GI:  Per HPI Heme:  Denies previous knowledge of low blood counts. Never had to take iron supplements.   Transfusions:  Other than the single unit of PRBC the other day, she has never been transfused. Neuro:  No headaches, no peripheral tingling or numbness Derm:  No itching, no rash or sores.  Endocrine:  No sweats or chills.  No polyuria or dysuria.  Wears reading glasses. No blurry vision.  Immunization:  Did not inquire as to vaccinations. Travel:  None beyond local counties in last few months.    PHYSICAL EXAM: Vital signs in last 24 hours: Filed Vitals:   08/25/15 1221  BP: 132/56  Pulse: 90  Temp: 99.6 F (37.6 C)  Resp: 16   Wt Readings from Last 3 Encounters:  08/25/15 139 lb 6.4 oz (63.231 kg)  07/17/15 139 lb 5 oz (63.192 kg)  03/22/15 156 lb 8 oz (70.988 kg)    General:  Pleasant, elderly but not frail-appearing WF. Comfortable. Doesn't look ill Head:  No facial edema or asymmetry. No signs of head trauma.  Eyes:  No scleral icterus, no conjunctival pallor. EOMI. Ears:  Slightly HOH.  Nose:  No congestion or discharge Mouth:  Mucous membranes moist, clear. Upper full dentures. Native teeth below Neck:  No mass, no bruits, no JVD, no TMG. Lungs:  Clear to auscultation bilaterally. No shortness of breath.  Occasional dry cough. Heart:  RRR. No MRG. S1/S2 audible. Abdomen:  Soft, not distended, not tender. No masses, no HSM. No bruits, no hernias.   Rectal:  Deferred   Musc/Skeltl:  Some arthritic changes in the hands and knees. Kyphotic spine. Extremities:  No CCE.  Neurologic:  Oriented 3. Cognition sharp. Alert. No limb weakness. No tremor. No gross neurologic deficits. Skin:  No telangiectasia, no rashes, no sores. Tattoos:  None Nodes:  No cervical adenopathy   Psych:  Pleasant, not depressed, calm  Intake/Output from previous day: 09/29 0701 - 09/30 0700 In: 811 [P.O.:761; IV Piggyback:50] Out: 976 [Urine:975; Stool:1] Intake/Output this shift: Total I/O In: -  Out: 300 [Urine:300]  LAB RESULTS:  Recent Labs  08/23/15 0515 08/25/15 0306  WBC 3.6* 3.6*  HGB 9.7* 9.4*  HCT 29.7* 28.7*  PLT 242 231   BMET Lab Results  Component Value Date   NA 139 08/25/2015   NA 140 08/24/2015   NA 137 08/23/2015   K 3.7 08/25/2015   K 4.0 08/24/2015   K 3.1* 08/23/2015   CL 106 08/25/2015   CL 107 08/24/2015   CL 103 08/23/2015   CO2 25 08/25/2015   CO2 26 08/24/2015   CO2 26 08/23/2015   GLUCOSE 121* 08/25/2015   GLUCOSE 100* 08/24/2015   GLUCOSE 101* 08/23/2015   BUN 44* 08/25/2015   BUN 43* 08/24/2015   BUN 44* 08/23/2015   CREATININE 2.09* 08/25/2015   CREATININE 1.88* 08/24/2015   CREATININE 1.91* 08/23/2015   CALCIUM 7.5* 08/25/2015   CALCIUM 7.5* 08/24/2015   CALCIUM 7.6* 08/23/2015   LFT  Recent Labs  08/22/15 1452 08/25/15 0306  PROT 3.8* 3.9*  ALBUMIN <1.0* <1.0*  AST 35 102*  ALT 15 79*  ALKPHOS 51 52  BILITOT 0.2* 0.4   PT/INR Lab Results  Component Value Date   INR 1.03 07/17/2015   INR 1.00 03/18/2015   Hepatitis Panel No results  for input(s): HEPBSAG, HCVAB, HEPAIGM, HEPBIGM in the last 72 hours. C-Diff No components found for: CDIFF Lipase  No results found for: LIPASE  Drugs of Abuse  No results found for: LABOPIA,  COCAINSCRNUR, LABBENZ, AMPHETMU, THCU, LABBARB   RADIOLOGY STUDIES: Ct Abdomen Pelvis Wo Contrast  08/24/2015   CLINICAL DATA:  Fever of unknown origin. Increasing abdominal girth, lower extremity edema and shortness of breath. History of CAD, stage III CKD and nephrotic syndrome.  EXAM: CT CHEST, ABDOMEN AND PELVIS WITHOUT CONTRAST  TECHNIQUE: Multidetector CT imaging of the chest, abdomen and pelvis was performed following the standard protocol without IV contrast.  COMPARISON:  08/22/2015 chest radiograph.  FINDINGS: CT CHEST FINDINGS  Mediastinum/Nodes: Normal heart size. Minimal pericardial fluid/thickening. There is atherosclerosis of the thoracic aorta, the great vessels of the mediastinum and the coronary arteries, including calcified atherosclerotic plaque in the left anterior descending and right coronary arteries. Great vessels are normal in course and caliber. Normal visualized thyroid. Normal esophagus. No axillary, mediastinal or gross hilar lymphadenopathy, noting limited sensitivity for the detection of hilar adenopathy on this noncontrast study.  Lungs/Pleura: No pneumothorax. Small layering bilateral pleural effusions. Associated passive atelectasis in the dependent lower lobes and dependent basilar right upper lobe. Mild ground-glass opacity and interlobular septal thickening in both upper lobes is most in keeping with mild pulmonary edema. There is a 1.9 x 0.7 cm focus of subpleural consolidation in the medial left upper lobe (series 205/image 16).  Musculoskeletal: No aggressive appearing focal osseous lesions. Moderate degenerative changes in the thoracic spine.  CT ABDOMEN AND PELVIS FINDINGS  Hepatobiliary: Normal liver with no liver mass. Normal gallbladder with no radiopaque cholelithiasis. No biliary ductal dilatation.  Pancreas: Normal, with no mass or duct dilation.  Spleen: Normal size. No mass.  Adrenals/Urinary Tract: Normal adrenals. Normal kidneys with no nephrolithiasis, no  hydronephrosis and no contour-deforming renal mass. Collapsed and grossly normal urinary bladder.  Stomach/Bowel: Small hiatal hernia. Otherwise collapsed and grossly normal stomach. Normal caliber small bowel with no small bowel wall thickening. Appendix is not discretely visualized. There is a masslike 2.4 x 2.1 cm focus of colonic wall thickening in the posterior distal transverse colon (series 201/image 56 and series 203/image 28). Within the adjacent gastro-colic ligament, there is ill-defined mixed density material measuring 4.8 x 3.0 cm in maximum axial dimensions (series 201/image 57), which contains internal foci of fat density and hyperdensity, and which is confluent with the focal transverse colonic wall thickening. No extraluminal gas. The hyperdense material in the gastrocolic ligament is of uncertain etiology and could represent calcification (such as the sequela of prior infectious or inflammatory episode) or oral contrast (from prior leak as no oral contrast has reached the transverse colon on this scan). Otherwise normal large bowel.  Vascular/Lymphatic: Atherosclerotic nonaneurysmal abdominal aorta. No pathologically enlarged lymph nodes in the abdomen or pelvis.  Reproductive: Grossly normal uterus.  No adnexal mass.  Other: No pneumoperitoneum.  Small volume ascites.  Musculoskeletal: Marked degenerative changes in the lumbar spine. Mild anasarca in the dependent subcutaneous soft tissues of the lower chest, flanks and pelvis.  IMPRESSION: 1. Small 1.9 x 0.7 cm focus of subpleural consolidation in the medial left upper lobe, indeterminate, possibly a small focus of infection. Recommend attention to this focus on a follow-up post treatment chest CT in 2-3 months. 2. Evidence of third-spacing, including small bilateral pleural effusions, small volume ascites, mild pulmonary edema and mild anasarca. 3. Focal colonic wall thickening in the posterior distal transverse  colon, cannot exclude colonic  neoplasm. Recommend correlation with colonoscopy. 4. Nonspecific mixed density material in the gastro-colic ligament adjacent to the focus of colonic wall thickening in the transverse colon. Favor the sequela of prior infection or prior perforation. No free air on this study to suggest recent perforation. 5.  Atherosclerosis, including two-vessel coronary artery disease. 6. Small hiatal hernia.   Electronically Signed   By: Ilona Sorrel M.D.   On: 08/24/2015 19:12   Ct Chest Wo Contrast  08/24/2015   CLINICAL DATA:  Fever of unknown origin. Increasing abdominal girth, lower extremity edema and shortness of breath. History of CAD, stage III CKD and nephrotic syndrome.  EXAM: CT CHEST, ABDOMEN AND PELVIS WITHOUT CONTRAST  TECHNIQUE: Multidetector CT imaging of the chest, abdomen and pelvis was performed following the standard protocol without IV contrast.  COMPARISON:  08/22/2015 chest radiograph.  FINDINGS: CT CHEST FINDINGS  Mediastinum/Nodes: Normal heart size. Minimal pericardial fluid/thickening. There is atherosclerosis of the thoracic aorta, the great vessels of the mediastinum and the coronary arteries, including calcified atherosclerotic plaque in the left anterior descending and right coronary arteries. Great vessels are normal in course and caliber. Normal visualized thyroid. Normal esophagus. No axillary, mediastinal or gross hilar lymphadenopathy, noting limited sensitivity for the detection of hilar adenopathy on this noncontrast study.  Lungs/Pleura: No pneumothorax. Small layering bilateral pleural effusions. Associated passive atelectasis in the dependent lower lobes and dependent basilar right upper lobe. Mild ground-glass opacity and interlobular septal thickening in both upper lobes is most in keeping with mild pulmonary edema. There is a 1.9 x 0.7 cm focus of subpleural consolidation in the medial left upper lobe (series 205/image 16).  Musculoskeletal: No aggressive appearing focal osseous  lesions. Moderate degenerative changes in the thoracic spine.  CT ABDOMEN AND PELVIS FINDINGS  Hepatobiliary: Normal liver with no liver mass. Normal gallbladder with no radiopaque cholelithiasis. No biliary ductal dilatation.  Pancreas: Normal, with no mass or duct dilation.  Spleen: Normal size. No mass.  Adrenals/Urinary Tract: Normal adrenals. Normal kidneys with no nephrolithiasis, no hydronephrosis and no contour-deforming renal mass. Collapsed and grossly normal urinary bladder.  Stomach/Bowel: Small hiatal hernia. Otherwise collapsed and grossly normal stomach. Normal caliber small bowel with no small bowel wall thickening. Appendix is not discretely visualized. There is a masslike 2.4 x 2.1 cm focus of colonic wall thickening in the posterior distal transverse colon (series 201/image 56 and series 203/image 28). Within the adjacent gastro-colic ligament, there is ill-defined mixed density material measuring 4.8 x 3.0 cm in maximum axial dimensions (series 201/image 57), which contains internal foci of fat density and hyperdensity, and which is confluent with the focal transverse colonic wall thickening. No extraluminal gas. The hyperdense material in the gastrocolic ligament is of uncertain etiology and could represent calcification (such as the sequela of prior infectious or inflammatory episode) or oral contrast (from prior leak as no oral contrast has reached the transverse colon on this scan). Otherwise normal large bowel.  Vascular/Lymphatic: Atherosclerotic nonaneurysmal abdominal aorta. No pathologically enlarged lymph nodes in the abdomen or pelvis.  Reproductive: Grossly normal uterus.  No adnexal mass.  Other: No pneumoperitoneum.  Small volume ascites.  Musculoskeletal: Marked degenerative changes in the lumbar spine. Mild anasarca in the dependent subcutaneous soft tissues of the lower chest, flanks and pelvis.  IMPRESSION: 1. Small 1.9 x 0.7 cm focus of subpleural consolidation in the medial  left upper lobe, indeterminate, possibly a small focus of infection. Recommend attention to this focus on a  follow-up post treatment chest CT in 2-3 months. 2. Evidence of third-spacing, including small bilateral pleural effusions, small volume ascites, mild pulmonary edema and mild anasarca. 3. Focal colonic wall thickening in the posterior distal transverse colon, cannot exclude colonic neoplasm. Recommend correlation with colonoscopy. 4. Nonspecific mixed density material in the gastro-colic ligament adjacent to the focus of colonic wall thickening in the transverse colon. Favor the sequela of prior infection or prior perforation. No free air on this study to suggest recent perforation. 5.  Atherosclerosis, including two-vessel coronary artery disease. 6. Small hiatal hernia.   Electronically Signed   By: Ilona Sorrel M.D.   On: 08/24/2015 19:12    ENDOSCOPIC STUDIES: None  IMPRESSION:   *  Nonspecific, transverse colon thickening with what looks like adjacent phlegmon. The patient has had fever during hospitalization.   White count actually below normal but this may be due to Cytoxan associated myelo suppression.  Also has not had abdominal pain but she does take moderate, daily doses of prednisone which may be masking symptoms.   PLAN:     *  Conservative management with 10 to 14 days of anti-biotics for now.  If doing well, can switch to po when ready for discharge.   *  Given that the focus is ill-defined, it is not likely to be amenable to percutaneous drainage.  Also, the patient is not likely to consent to this.  *  Given her advanced age and lack of cognitive impairment, would honor her decision against colonoscopy.  *  Check CEA level in the morning.  If it's significantly elevated this may give [place] a better idea if this is malignant or not.   Azucena Freed  08/25/2015, 12:56 PM Pager: [phone]

## 2015-08-25 NOTE — Care Management Note (Signed)
Case Management Note  Patient Details  Name: Erika [name] MRN: [id number] Date of Birth: January 19, 1928  Subjective/Objective:      Admitted with Sepsis              Action/Plan: Patient is independent of ADL's, continues to work at [employer] daily from 10 am to 3 pm. Lives at home with spouse, drives and does grocery shopping. She has a cane and walker at home but does not use them. Patient stated, " I'm ready to go home and go back to work." No needs identified.  Expected Discharge Date:    possibly 08/26/2015              Expected Discharge Plan:  Home/Self Care  Discharge planning Services  CM Consult    Status of Service:  Completed, signed off  Medicare Important Message Given:  Yes-third notification given  Sherrilyn Rist [phone] 08/25/2015, 4:56 PM

## 2015-08-25 NOTE — Consult Note (Signed)
Reason for Consult: focal colonic thickening with adjacent fluid collection Referring Physician: Dr. Onalee Hua Tat   HPI: Erika [name] is a 81 year old female with a history of a history of CHF, nephrotic syndrome, CKD, HTN presented on 9/23 with a CHF exacerbation and UTI sepsis.  She continued to have fevers despite being on Zosyn.  A CT of abdomen and pelvis showed focal wall thickening in the distal transverse colon with adjacent focus.  We have therefore been asked to evaluate.  The patient denies any abdominal pain, nausea, vomiting, diarrhea, melena or hematochezia.  She reports weight gain, appetite has been good.  White count is low, however, the patient was started on Cytotec and Prednisone in July after diagnosis of nephrotic syndrome.  Fever of 102 9/29 10AM.  Again, the patient denies any abdominal pain.  She has never had a colonoscopy, nor does she want one.   Past Medical History  Diagnosis Date  . Hypertension   . Hyperlipidemia   . Myocardial infarction 1990  . CAD (coronary artery disease)     post MI in 75  . Osteopenia   . Hypothyroidism   . Fall Sept 2012    with left zygoma fracture  . Normal nuclear stress test July 2012    No ischemia.   . Right bundle branch block   . Tachycardia- per BP machine 08/18/2015  . Acute on chronic diastolic HF (heart failure) 08/18/2015    Past Surgical History  Procedure Laterality Date  . Cardiac catheterization  10/18/1994    Single vessel disease with a severe stenosis in the RCA which had recannalized since prior study in 1990. Managed medically  . Cardiac catheterization  09/22/89  . Cataract extraction, bilateral  10/2009    Family History  Problem Relation Age of Onset  . Heart attack Father 76  . Heart attack Mother 13  . Heart disease Brother   . Heart disease Brother     Social History:  reports that she has never smoked. She has never used smokeless tobacco. She reports that she does not drink alcohol or use  illicit drugs.  Allergies: No Known Allergies  Medications:  No current facility-administered medications on file prior to encounter.   Current Outpatient Prescriptions on File Prior to Encounter  Medication Sig Dispense Refill  . acetaminophen (TYLENOL) 325 MG tablet Take 650 mg by mouth every 6 (six) hours as needed for mild pain or moderate pain.    [name] atorvastatin (LIPITOR) 40 MG tablet Take 20 mg by mouth daily.    . cyclophosphamide (CYTOXAN) 50 MG tablet Take 1 tablet (50 mg total) by mouth daily. Give on an empty stomach 1 hour before or 2 hours after meals.    [name] ezetimibe (ZETIA) 10 MG tablet Take 1 tablet (10 mg total) by mouth daily. 15 tablet 0  . predniSONE (DELTASONE) 10 MG tablet Take 10 mg by mouth every other day.        Results for orders placed or performed during the hospital encounter of 08/18/15 (from the past 48 hour(s))  Basic metabolic panel     Status: Abnormal   Collection Time: 08/24/15  5:24 AM  Result Value Ref Range   Sodium 140 135 - 145 mmol/L   Potassium 4.0 3.5 - 5.1 mmol/L    Comment: DELTA CHECK NOTED   Chloride 107 101 - 111 mmol/L   CO2 26 22 - 32 mmol/L   Glucose, Bld 100 (H) 65 - 99 mg/dL  BUN 43 (H) 6 - 20 mg/dL   Creatinine, Ser 1.88 (H) 0.44 - 1.00 mg/dL   Calcium 7.5 (L) 8.9 - 10.3 mg/dL   GFR calc non Af Amer 23 (L) >60 mL/min   GFR calc Af Amer 27 (L) >60 mL/min    Comment: (NOTE) The eGFR has been calculated using the CKD EPI equation. This calculation has not been validated in all clinical situations. eGFR's persistently <60 mL/min signify possible Chronic Kidney Disease.    Anion gap 7 5 - 15  Procalcitonin     Status: None   Collection Time: 08/24/15  5:24 AM  Result Value Ref Range   Procalcitonin 0.44 ng/mL    Comment:        Interpretation: PCT (Procalcitonin) <= 0.5 ng/mL: Systemic infection (sepsis) is not likely. Local bacterial infection is possible. (NOTE)         ICU PCT Algorithm               Non ICU PCT  Algorithm    ----------------------------     ------------------------------         PCT < 0.25 ng/mL                 PCT < 0.1 ng/mL     Stopping of antibiotics            Stopping of antibiotics       strongly encouraged.               strongly encouraged.    ----------------------------     ------------------------------       PCT level decrease by               PCT < 0.25 ng/mL       >= 80% from peak PCT       OR PCT 0.25 - 0.5 ng/mL          Stopping of antibiotics                                             encouraged.     Stopping of antibiotics           encouraged.    ----------------------------     ------------------------------       PCT level decrease by              PCT >= 0.25 ng/mL       < 80% from peak PCT        AND PCT >= 0.5 ng/mL            Continuin g antibiotics                                              encouraged.       Continuing antibiotics            encouraged.    ----------------------------     ------------------------------     PCT level increase compared          PCT > 0.5 ng/mL         with peak PCT AND          PCT >= 0.5 ng/mL  Escalation of antibiotics                                          strongly encouraged.      Escalation of antibiotics        strongly encouraged.   Magnesium     Status: None   Collection Time: 08/24/15  5:24 AM  Result Value Ref Range   Magnesium 2.0 1.7 - 2.4 mg/dL  C difficile quick scan w PCR reflex     Status: None   Collection Time: 08/24/15  8:04 PM  Result Value Ref Range   C Diff antigen NEGATIVE NEGATIVE   C Diff toxin NEGATIVE NEGATIVE   C Diff interpretation Negative for toxigenic C. difficile   CBC with Differential/Platelet     Status: Abnormal   Collection Time: 08/25/15  3:06 AM  Result Value Ref Range   WBC 3.6 (L) 4.0 - 10.5 K/uL   RBC 3.12 (L) 3.87 - 5.11 MIL/uL   Hemoglobin 9.4 (L) 12.0 - 15.0 g/dL   HCT 28.7 (L) 36.0 - 46.0 %   MCV 92.0 78.0 - 100.0 fL   MCH 30.1 26.0 - 34.0 pg    MCHC 32.8 30.0 - 36.0 g/dL   RDW 17.3 (H) 11.5 - 15.5 %   Platelets 231 150 - 400 K/uL   Neutrophils Relative % 88 %   Neutro Abs 3.2 1.7 - 7.7 K/uL   Lymphocytes Relative 3 %   Lymphs Abs 0.1 (L) 0.7 - 4.0 K/uL   Monocytes Relative 7 %   Monocytes Absolute 0.2 0.1 - 1.0 K/uL   Eosinophils Relative 1 %   Eosinophils Absolute 0.0 0.0 - 0.7 K/uL   Basophils Relative 1 %   Basophils Absolute 0.0 0.0 - 0.1 K/uL  Comprehensive metabolic panel     Status: Abnormal   Collection Time: 08/25/15  3:06 AM  Result Value Ref Range   Sodium 139 135 - 145 mmol/L   Potassium 3.7 3.5 - 5.1 mmol/L   Chloride 106 101 - 111 mmol/L   CO2 25 22 - 32 mmol/L   Glucose, Bld 121 (H) 65 - 99 mg/dL   BUN 44 (H) 6 - 20 mg/dL   Creatinine, Ser 2.09 (H) 0.44 - 1.00 mg/dL   Calcium 7.5 (L) 8.9 - 10.3 mg/dL   Total Protein 3.9 (L) 6.5 - 8.1 g/dL   Albumin <1.0 (L) 3.5 - 5.0 g/dL   AST 102 (H) 15 - 41 U/L   ALT 79 (H) 14 - 54 U/L   Alkaline Phosphatase 52 38 - 126 U/L   Total Bilirubin 0.4 0.3 - 1.2 mg/dL   GFR calc non Af Amer 20 (L) >60 mL/min   GFR calc Af Amer 24 (L) >60 mL/min    Comment: (NOTE) The eGFR has been calculated using the CKD EPI equation. This calculation has not been validated in all clinical situations. eGFR's persistently <60 mL/min signify possible Chronic Kidney Disease.    Anion gap 8 5 - 15    Ct Abdomen Pelvis Wo Contrast  08/24/2015   CLINICAL DATA:  Fever of unknown origin. Increasing abdominal girth, lower extremity edema and shortness of breath. History of CAD, stage III CKD and nephrotic syndrome.  EXAM: CT CHEST, ABDOMEN AND PELVIS WITHOUT CONTRAST  TECHNIQUE: Multidetector CT imaging of the chest, abdomen and pelvis was performed following the standard protocol without  IV contrast.  COMPARISON:  08/22/2015 chest radiograph.  FINDINGS: CT CHEST FINDINGS  Mediastinum/Nodes: Normal heart size. Minimal pericardial fluid/thickening. There is atherosclerosis of the thoracic  aorta, the great vessels of the mediastinum and the coronary arteries, including calcified atherosclerotic plaque in the left anterior descending and right coronary arteries. Great vessels are normal in course and caliber. Normal visualized thyroid. Normal esophagus. No axillary, mediastinal or gross hilar lymphadenopathy, noting limited sensitivity for the detection of hilar adenopathy on this noncontrast study.  Lungs/Pleura: No pneumothorax. Small layering bilateral pleural effusions. Associated passive atelectasis in the dependent lower lobes and dependent basilar right upper lobe. Mild ground-glass opacity and interlobular septal thickening in both upper lobes is most in keeping with mild pulmonary edema. There is a 1.9 x 0.7 cm focus of subpleural consolidation in the medial left upper lobe (series 205/image 16).  Musculoskeletal: No aggressive appearing focal osseous lesions. Moderate degenerative changes in the thoracic spine.  CT ABDOMEN AND PELVIS FINDINGS  Hepatobiliary: Normal liver with no liver mass. Normal gallbladder with no radiopaque cholelithiasis. No biliary ductal dilatation.  Pancreas: Normal, with no mass or duct dilation.  Spleen: Normal size. No mass.  Adrenals/Urinary Tract: Normal adrenals. Normal kidneys with no nephrolithiasis, no hydronephrosis and no contour-deforming renal mass. Collapsed and grossly normal urinary bladder.  Stomach/Bowel: Small hiatal hernia. Otherwise collapsed and grossly normal stomach. Normal caliber small bowel with no small bowel wall thickening. Appendix is not discretely visualized. There is a masslike 2.4 x 2.1 cm focus of colonic wall thickening in the posterior distal transverse colon (series 201/image 56 and series 203/image 28). Within the adjacent gastro-colic ligament, there is ill-defined mixed density material measuring 4.8 x 3.0 cm in maximum axial dimensions (series 201/image 57), which contains internal foci of fat density and hyperdensity, and  which is confluent with the focal transverse colonic wall thickening. No extraluminal gas. The hyperdense material in the gastrocolic ligament is of uncertain etiology and could represent calcification (such as the sequela of prior infectious or inflammatory episode) or oral contrast (from prior leak as no oral contrast has reached the transverse colon on this scan). Otherwise normal large bowel.  Vascular/Lymphatic: Atherosclerotic nonaneurysmal abdominal aorta. No pathologically enlarged lymph nodes in the abdomen or pelvis.  Reproductive: Grossly normal uterus.  No adnexal mass.  Other: No pneumoperitoneum.  Small volume ascites.  Musculoskeletal: Marked degenerative changes in the lumbar spine. Mild anasarca in the dependent subcutaneous soft tissues of the lower chest, flanks and pelvis.  IMPRESSION: 1. Small 1.9 x 0.7 cm focus of subpleural consolidation in the medial left upper lobe, indeterminate, possibly a small focus of infection. Recommend attention to this focus on a follow-up post treatment chest CT in 2-3 months. 2. Evidence of third-spacing, including small bilateral pleural effusions, small volume ascites, mild pulmonary edema and mild anasarca. 3. Focal colonic wall thickening in the posterior distal transverse colon, cannot exclude colonic neoplasm. Recommend correlation with colonoscopy. 4. Nonspecific mixed density material in the gastro-colic ligament adjacent to the focus of colonic wall thickening in the transverse colon. Favor the sequela of prior infection or prior perforation. No free air on this study to suggest recent perforation. 5.  Atherosclerosis, including two-vessel coronary artery disease. 6. Small hiatal hernia.   Electronically Signed   By: Ilona Sorrel M.D.   On: 08/24/2015 19:12   Ct Chest Wo Contrast  08/24/2015   CLINICAL DATA:  Fever of unknown origin. Increasing abdominal girth, lower extremity edema and shortness of breath. History  of CAD, stage III CKD and nephrotic  syndrome.  EXAM: CT CHEST, ABDOMEN AND PELVIS WITHOUT CONTRAST  TECHNIQUE: Multidetector CT imaging of the chest, abdomen and pelvis was performed following the standard protocol without IV contrast.  COMPARISON:  08/22/2015 chest radiograph.  FINDINGS: CT CHEST FINDINGS  Mediastinum/Nodes: Normal heart size. Minimal pericardial fluid/thickening. There is atherosclerosis of the thoracic aorta, the great vessels of the mediastinum and the coronary arteries, including calcified atherosclerotic plaque in the left anterior descending and right coronary arteries. Great vessels are normal in course and caliber. Normal visualized thyroid. Normal esophagus. No axillary, mediastinal or gross hilar lymphadenopathy, noting limited sensitivity for the detection of hilar adenopathy on this noncontrast study.  Lungs/Pleura: No pneumothorax. Small layering bilateral pleural effusions. Associated passive atelectasis in the dependent lower lobes and dependent basilar right upper lobe. Mild ground-glass opacity and interlobular septal thickening in both upper lobes is most in keeping with mild pulmonary edema. There is a 1.9 x 0.7 cm focus of subpleural consolidation in the medial left upper lobe (series 205/image 16).  Musculoskeletal: No aggressive appearing focal osseous lesions. Moderate degenerative changes in the thoracic spine.  CT ABDOMEN AND PELVIS FINDINGS  Hepatobiliary: Normal liver with no liver mass. Normal gallbladder with no radiopaque cholelithiasis. No biliary ductal dilatation.  Pancreas: Normal, with no mass or duct dilation.  Spleen: Normal size. No mass.  Adrenals/Urinary Tract: Normal adrenals. Normal kidneys with no nephrolithiasis, no hydronephrosis and no contour-deforming renal mass. Collapsed and grossly normal urinary bladder.  Stomach/Bowel: Small hiatal hernia. Otherwise collapsed and grossly normal stomach. Normal caliber small bowel with no small bowel wall thickening. Appendix is not discretely  visualized. There is a masslike 2.4 x 2.1 cm focus of colonic wall thickening in the posterior distal transverse colon (series 201/image 56 and series 203/image 28). Within the adjacent gastro-colic ligament, there is ill-defined mixed density material measuring 4.8 x 3.0 cm in maximum axial dimensions (series 201/image 57), which contains internal foci of fat density and hyperdensity, and which is confluent with the focal transverse colonic wall thickening. No extraluminal gas. The hyperdense material in the gastrocolic ligament is of uncertain etiology and could represent calcification (such as the sequela of prior infectious or inflammatory episode) or oral contrast (from prior leak as no oral contrast has reached the transverse colon on this scan). Otherwise normal large bowel.  Vascular/Lymphatic: Atherosclerotic nonaneurysmal abdominal aorta. No pathologically enlarged lymph nodes in the abdomen or pelvis.  Reproductive: Grossly normal uterus.  No adnexal mass.  Other: No pneumoperitoneum.  Small volume ascites.  Musculoskeletal: Marked degenerative changes in the lumbar spine. Mild anasarca in the dependent subcutaneous soft tissues of the lower chest, flanks and pelvis.  IMPRESSION: 1. Small 1.9 x 0.7 cm focus of subpleural consolidation in the medial left upper lobe, indeterminate, possibly a small focus of infection. Recommend attention to this focus on a follow-up post treatment chest CT in 2-3 months. 2. Evidence of third-spacing, including small bilateral pleural effusions, small volume ascites, mild pulmonary edema and mild anasarca. 3. Focal colonic wall thickening in the posterior distal transverse colon, cannot exclude colonic neoplasm. Recommend correlation with colonoscopy. 4. Nonspecific mixed density material in the gastro-colic ligament adjacent to the focus of colonic wall thickening in the transverse colon. Favor the sequela of prior infection or prior perforation. No free air on this study  to suggest recent perforation. 5.  Atherosclerosis, including two-vessel coronary artery disease. 6. Small hiatal hernia.   Electronically Signed   By: Corene Cornea  A Poff M.D.   On: 08/24/2015 19:12    Review of Systems  All other systems reviewed and are negative.  Blood pressure 132/56, pulse 90, temperature 99.6 F (37.6 C), temperature source Oral, resp. rate 16, height 5' (1.524 m), weight 63.231 kg (139 lb 6.4 oz), SpO2 92 %. Physical Exam  Constitutional: She is oriented to person, place, and time. She appears well-developed and well-nourished. No distress.  Cardiovascular: Normal rate, regular rhythm and intact distal pulses.  Exam reveals no gallop and no friction rub.   No murmur heard. Respiratory: Effort normal and breath sounds normal. No respiratory distress. She has no wheezes. She has no rales. She exhibits no tenderness.  GI: Soft. Bowel sounds are normal. She exhibits no distension and no mass. There is no tenderness. There is no rebound and no guarding.  Musculoskeletal: Normal range of motion. She exhibits no edema or tenderness.  Neurological: She is alert and oriented to person, place, and time.  Skin: Skin is warm and dry. No rash noted. She is not diaphoretic. No erythema. No pallor.  Psychiatric: She has a normal mood and affect. Her behavior is normal. Judgment and thought content normal.    Assessment/Plan: Colonic wall thickening with adjacent phlegmon-the patient is non tender on exam, however, she is on prednisone $RemoveBefor'10mg'GeliBCeznIJG$  daily which may mask some of her symptoms.  The adjacent "focus" is ill defined and small therefore likely not amendable to IR drainage. GI has been consulted regarding a colonoscopy, however, if the patient perforated than a colonoscopy may be too risky. Lastly, the patient doesn't want to have a colonoscopy or any surgeries.  She understands that this may be a cancer and if so, may become obstructing or may perforate making her ill.  The patient states  that she's 87 and she does not want any further work up.  She is agreeable with antibiotics.  She has received zosyn 4 days and now is on rocephin now.  I will add flagyl. May transition to PO antibiotics for a total of 14 days of therapy.  She does not need to follow up in our office.  Should she reconsider, which I doubt, then it would be ideal to get an interval colonoscopy and a referral to our office.  Surgery signing off, please call for further assistance.   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      RIEBOCK, EMINA ANP-BC 08/25/2015, 12:47 PM

## 2015-08-25 NOTE — Progress Notes (Signed)
Subjective:  Says breathing is good this morning.  No complaints of shortness of breath.  He had recurrent fever yesterday.  Creatinine up today.  Objective:  Vital Signs in the last 24 hours: BP 156/64 mmHg  Pulse 76  Temp(Src) 99.1 F (37.3 C) (Oral)  Resp 18  Ht 5' (1.524 m)  Wt 63.231 kg (139 lb 6.4 oz)  BMI 27.22 kg/m2  SpO2 91%  Physical Exam: Elderly female in no acute distress  Lungs:  Clear  Cardiac:  Regular rhythm, normal S1 and S2, no S3 Abdomen:  Soft, nontender, no masses Extremities:  1+ edema present  Intake/Output from previous day: 09/29 0701 - 09/30 0700 In: 811 [P.O.:761; IV Piggyback:50] Out: 976 [Urine:975; Stool:1] Weight Filed Weights   08/23/15 0332 08/24/15 0443 08/25/15 0454  Weight: 63.458 kg (139 lb 14.4 oz) 63.322 kg (139 lb 9.6 oz) 63.231 kg (139 lb 6.4 oz)    Lab Results: Basic Metabolic Panel:  Recent Labs  08/24/15 0524 08/25/15 0306  NA 140 139  K 4.0 3.7  CL 107 106  CO2 26 25  GLUCOSE 100* 121*  BUN 43* 44*  CREATININE 1.88* 2.09*    CBC:  Recent Labs  08/23/15 0515 08/25/15 0306  WBC 3.6* 3.6*  NEUTROABS  --  3.2  HGB 9.7* 9.4*  HCT 29.7* 28.7*  MCV 89.7 92.0  PLT 242 231    BNP    Component Value Date/Time   BNP 270.4* 08/18/2015 1810   Telemetry: Sinus with PVCs and PACs  Assessment/Plan:  1.  Fever of uncertain etiology may be due to urinary tract source-continued fever to be managed by internal medicine  2.  Anemia currently improved  but still present  3.  Chronic diastolic heart failur-very difficult to determine volume status.  Her breathing is better but she says pleural effusions on x-ray that also could be due to nephrotic syndrome. vere nephrotic syndrome and nephritis with albumin of less than 1 4.  Stage 3-4 chronic kidney disease somewhat worse with diuresis  5.  Hypokalemia-resolved  Recommendations:  Since she is still febrile  andtiming of discharge determined by internal medicine  consultant.  Continue intravenous antibiotics. Will need to be careful with diuresis as renal function goes up.  Continue to look for source of infection.    Kerry Hough  MD [facility]  08/25/2015, 8:45 AM

## 2015-08-25 NOTE — Progress Notes (Signed)
PROGRESS NOTE  Erika [name]:[id number] DOB: [birth year] DOA: 08/18/2015 PCP: Precious Reel, MD  Brief history A 81 year old female with a history of CAD with MI in 1990 with Single vessel disease with a severe stenosis in the RCA which had recannalized since prior study in 1990, hyperlipidemia, hypothyroidism, CKD stage III, nephrotic syndrome with biopsy-proven membranous nephropathy presented on 08/18/2015 with increasing abdominal girth, lower extremity edema, and shortness of breath. The patient was up 13 pounds above her dry weight at 146 pounds on the day of admission. The patient was started on intravenous furosemide with good clinical response. Nephrology was consulted secondary to the patient's CKD and nephrotic syndrome. The patient had been previously on Cytoxan and prednisone which she did not start taking until July 2016. She was recently discharged in August 2016 secondary to syncope. At that time, her diuretics and ARB were held. She was hydrated and improved clinically. At the time of discharge, the patient's torsemide was started at a lower dose, 80 mg every afternoon, 100 mg every morning Unfortunately, he began developing fevers on 08/19/2015 up to 101.56F. Assessment/Plan: Fever/leukopenia/lactic acidosis-->Sepsis -still with fevers despite abx -Unfortunately, the patient has been on Cytoxan which may be blunting her inflammatory response -fever persistent -Cytoxan was discontinued after her 08/22/2015 dose -Lactic acid 2.6 --> 2.0  -Procalcitonin 0.38 -->0.44  -CXR without consolidation -CT chest--small left upper lobe subpleural consolidation; bilateral mild groundglass opacities more consistent with edema -CT abd/pelvis--masslike colon wall thickening in the transverse colon, ill-defined mixed density material adjacent to the transverse colon questioning leak versus abscess versus inflammatory response from prior infection -Concerned that CT abdomen pelvis  findings may be contributing to the patient's persistent fever -Consulted GI and general surgery for evaluation -c diff screen--negative -no meningismus on exam -lower extremity duplex r/o DVT -CBC with diff and LFTs in am UTI  -d/c zosyn -Continue ceftriaxone -inflammatory response, pyuria may be blunted by pt on cytoxan Acute on chronic renal failure (CKD 4)/ nephrotic syndrome/membranous glomerulonephritis -per nephrology -continue prednisone  -furosemide IV-->po torsemide--deferred dosing to nephrology -Cyclophosphamide was discontinued after 08/22/2015 dose  Acute on chronic diastolic CHF -Baseline weight at approximately 132 -Deferred her diuretics dosing to nephrology and cardiology -Continue carvedilol -NEG 4 pounds since admission -Clinically stable on room air  Leukopenia -Secondary to Cytoxan--last dose given 08/22/2015 Elevated troponin -Secondary to demand ischemia in the setting of decompensated CHF and worsening renal function Anemia CKD  -Received 1 unit PRBC 08/21/2015  Hypothyroidism  -TSH 1.855  -Continue Synthroid  Hyperlipidemia  -Continue statin  CAD with history of MI -Continue aspirin Hypokalemia -Repleted  Family Communication: Husband updated at beside Disposition Plan: Home when work up complete     Procedures/Studies: Ct Abdomen Pelvis Wo Contrast  08/24/2015   CLINICAL DATA:  Fever of unknown origin. Increasing abdominal girth, lower extremity edema and shortness of breath. History of CAD, stage III CKD and nephrotic syndrome.  EXAM: CT CHEST, ABDOMEN AND PELVIS WITHOUT CONTRAST  TECHNIQUE: Multidetector CT imaging of the chest, abdomen and pelvis was performed following the standard protocol without IV contrast.  COMPARISON:  08/22/2015 chest radiograph.  FINDINGS: CT CHEST FINDINGS  Mediastinum/Nodes: Normal heart size. Minimal pericardial fluid/thickening. There is atherosclerosis of the thoracic aorta, the great vessels of the  mediastinum and the coronary arteries, including calcified atherosclerotic plaque in the left anterior descending and right coronary arteries. Great vessels are normal in course and caliber. Normal visualized thyroid. Normal  esophagus. No axillary, mediastinal or gross hilar lymphadenopathy, noting limited sensitivity for the detection of hilar adenopathy on this noncontrast study.  Lungs/Pleura: No pneumothorax. Small layering bilateral pleural effusions. Associated passive atelectasis in the dependent lower lobes and dependent basilar right upper lobe. Mild ground-glass opacity and interlobular septal thickening in both upper lobes is most in keeping with mild pulmonary edema. There is a 1.9 x 0.7 cm focus of subpleural consolidation in the medial left upper lobe (series 205/image 16).  Musculoskeletal: No aggressive appearing focal osseous lesions. Moderate degenerative changes in the thoracic spine.  CT ABDOMEN AND PELVIS FINDINGS  Hepatobiliary: Normal liver with no liver mass. Normal gallbladder with no radiopaque cholelithiasis. No biliary ductal dilatation.  Pancreas: Normal, with no mass or duct dilation.  Spleen: Normal size. No mass.  Adrenals/Urinary Tract: Normal adrenals. Normal kidneys with no nephrolithiasis, no hydronephrosis and no contour-deforming renal mass. Collapsed and grossly normal urinary bladder.  Stomach/Bowel: Small hiatal hernia. Otherwise collapsed and grossly normal stomach. Normal caliber small bowel with no small bowel wall thickening. Appendix is not discretely visualized. There is a masslike 2.4 x 2.1 cm focus of colonic wall thickening in the posterior distal transverse colon (series 201/image 56 and series 203/image 28). Within the adjacent gastro-colic ligament, there is ill-defined mixed density material measuring 4.8 x 3.0 cm in maximum axial dimensions (series 201/image 57), which contains internal foci of fat density and hyperdensity, and which is confluent with the focal  transverse colonic wall thickening. No extraluminal gas. The hyperdense material in the gastrocolic ligament is of uncertain etiology and could represent calcification (such as the sequela of prior infectious or inflammatory episode) or oral contrast (from prior leak as no oral contrast has reached the transverse colon on this scan). Otherwise normal large bowel.  Vascular/Lymphatic: Atherosclerotic nonaneurysmal abdominal aorta. No pathologically enlarged lymph nodes in the abdomen or pelvis.  Reproductive: Grossly normal uterus.  No adnexal mass.  Other: No pneumoperitoneum.  Small volume ascites.  Musculoskeletal: Marked degenerative changes in the lumbar spine. Mild anasarca in the dependent subcutaneous soft tissues of the lower chest, flanks and pelvis.  IMPRESSION: 1. Small 1.9 x 0.7 cm focus of subpleural consolidation in the medial left upper lobe, indeterminate, possibly a small focus of infection. Recommend attention to this focus on a follow-up post treatment chest CT in 2-3 months. 2. Evidence of third-spacing, including small bilateral pleural effusions, small volume ascites, mild pulmonary edema and mild anasarca. 3. Focal colonic wall thickening in the posterior distal transverse colon, cannot exclude colonic neoplasm. Recommend correlation with colonoscopy. 4. Nonspecific mixed density material in the gastro-colic ligament adjacent to the focus of colonic wall thickening in the transverse colon. Favor the sequela of prior infection or prior perforation. No free air on this study to suggest recent perforation. 5.  Atherosclerosis, including two-vessel coronary artery disease. 6. Small hiatal hernia.   Electronically Signed   By: Ilona Sorrel M.D.   On: 08/24/2015 19:12   Dg Chest 2 View  08/22/2015   CLINICAL DATA:  Fever with sweats for 1 day  EXAM: CHEST  2 VIEW  COMPARISON:  August 18, 2015  FINDINGS: There is mild atelectasis in the left base. Lungs elsewhere clear. Heart size and pulmonary  vascularity are normal. No adenopathy. There is degenerative change in the thoracic spine. There is calcification in both carotid arteries.  IMPRESSION: Left base atelectasis. No frank edema or consolidation. Calcification in each carotid artery.   Electronically Signed   By: Gwyndolyn Saxon  Jasmine December III M.D.   On: 08/22/2015 18:36   Dg Chest 2 View  08/18/2015   CLINICAL DATA:  Shortness of breath.  Symptoms today.  EXAM: CHEST  2 VIEW  COMPARISON:  07/17/2015  FINDINGS: Linear atelectasis in the lingula. Right lung is clear. Heart is upper limits normal in size. No effusions. No acute bony abnormality.  IMPRESSION: Lingular atelectasis.   Electronically Signed   By: Rolm Baptise M.D.   On: 08/18/2015 18:08   Ct Chest Wo Contrast  08/24/2015   CLINICAL DATA:  Fever of unknown origin. Increasing abdominal girth, lower extremity edema and shortness of breath. History of CAD, stage III CKD and nephrotic syndrome.  EXAM: CT CHEST, ABDOMEN AND PELVIS WITHOUT CONTRAST  TECHNIQUE: Multidetector CT imaging of the chest, abdomen and pelvis was performed following the standard protocol without IV contrast.  COMPARISON:  08/22/2015 chest radiograph.  FINDINGS: CT CHEST FINDINGS  Mediastinum/Nodes: Normal heart size. Minimal pericardial fluid/thickening. There is atherosclerosis of the thoracic aorta, the great vessels of the mediastinum and the coronary arteries, including calcified atherosclerotic plaque in the left anterior descending and right coronary arteries. Great vessels are normal in course and caliber. Normal visualized thyroid. Normal esophagus. No axillary, mediastinal or gross hilar lymphadenopathy, noting limited sensitivity for the detection of hilar adenopathy on this noncontrast study.  Lungs/Pleura: No pneumothorax. Small layering bilateral pleural effusions. Associated passive atelectasis in the dependent lower lobes and dependent basilar right upper lobe. Mild ground-glass opacity and interlobular septal  thickening in both upper lobes is most in keeping with mild pulmonary edema. There is a 1.9 x 0.7 cm focus of subpleural consolidation in the medial left upper lobe (series 205/image 16).  Musculoskeletal: No aggressive appearing focal osseous lesions. Moderate degenerative changes in the thoracic spine.  CT ABDOMEN AND PELVIS FINDINGS  Hepatobiliary: Normal liver with no liver mass. Normal gallbladder with no radiopaque cholelithiasis. No biliary ductal dilatation.  Pancreas: Normal, with no mass or duct dilation.  Spleen: Normal size. No mass.  Adrenals/Urinary Tract: Normal adrenals. Normal kidneys with no nephrolithiasis, no hydronephrosis and no contour-deforming renal mass. Collapsed and grossly normal urinary bladder.  Stomach/Bowel: Small hiatal hernia. Otherwise collapsed and grossly normal stomach. Normal caliber small bowel with no small bowel wall thickening. Appendix is not discretely visualized. There is a masslike 2.4 x 2.1 cm focus of colonic wall thickening in the posterior distal transverse colon (series 201/image 56 and series 203/image 28). Within the adjacent gastro-colic ligament, there is ill-defined mixed density material measuring 4.8 x 3.0 cm in maximum axial dimensions (series 201/image 57), which contains internal foci of fat density and hyperdensity, and which is confluent with the focal transverse colonic wall thickening. No extraluminal gas. The hyperdense material in the gastrocolic ligament is of uncertain etiology and could represent calcification (such as the sequela of prior infectious or inflammatory episode) or oral contrast (from prior leak as no oral contrast has reached the transverse colon on this scan). Otherwise normal large bowel.  Vascular/Lymphatic: Atherosclerotic nonaneurysmal abdominal aorta. No pathologically enlarged lymph nodes in the abdomen or pelvis.  Reproductive: Grossly normal uterus.  No adnexal mass.  Other: No pneumoperitoneum.  Small volume ascites.   Musculoskeletal: Marked degenerative changes in the lumbar spine. Mild anasarca in the dependent subcutaneous soft tissues of the lower chest, flanks and pelvis.  IMPRESSION: 1. Small 1.9 x 0.7 cm focus of subpleural consolidation in the medial left upper lobe, indeterminate, possibly a small focus of infection. Recommend attention to this  focus on a follow-up post treatment chest CT in 2-3 months. 2. Evidence of third-spacing, including small bilateral pleural effusions, small volume ascites, mild pulmonary edema and mild anasarca. 3. Focal colonic wall thickening in the posterior distal transverse colon, cannot exclude colonic neoplasm. Recommend correlation with colonoscopy. 4. Nonspecific mixed density material in the gastro-colic ligament adjacent to the focus of colonic wall thickening in the transverse colon. Favor the sequela of prior infection or prior perforation. No free air on this study to suggest recent perforation. 5.  Atherosclerosis, including two-vessel coronary artery disease. 6. Small hiatal hernia.   Electronically Signed   By: Ilona Sorrel M.D.   On: 08/24/2015 19:12         Subjective: Patient denies fevers, chills, headache, chest pain, dyspnea, nausea, vomiting, diarrhea, abdominal pain, dysuria, hematuria   Objective: Filed Vitals:   08/24/15 2100 08/24/15 2200 08/25/15 0454 08/25/15 0859  BP: 126/61  156/64 156/64  Pulse: 83  76 76  Temp: 98.1 F (36.7 C)  99.1 F (37.3 C)   TempSrc: Oral  Oral   Resp: 20  18   Height:      Weight:   63.231 kg (139 lb 6.4 oz)   SpO2: 90% 94% 91%     Intake/Output Summary (Last 24 hours) at 08/25/15 1149 Last data filed at 08/24/15 2200  Gross per 24 hour  Intake    591 ml  Output    876 ml  Net   -285 ml   Weight change: -0.091 kg (-3.2 oz) Exam:   General:  Pt is alert, follows commands appropriately, not in acute distress  HEENT: No icterus, No thrush, No neck mass, [place]/AT  Cardiovascular: RRR, S1/S2, no rubs, no  gallops  Respiratory: Fine bibasilar crackles. No wheezing  Abdomen: Soft/+BS, non tender, non distended, no guarding; no hepatosplenomegaly   Extremities: trace LE edema, No lymphangitis, No petechiae, No rashes, no synovitis; no cyanosis or clubbing  Data Reviewed: Basic Metabolic Panel:  Recent Labs Lab 08/20/15 0300 08/21/15 0328 08/22/15 1452 08/23/15 0515 08/24/15 0524 08/25/15 0306  NA 138 140 137 137 140 139  K 3.4* 3.9 2.8* 3.1* 4.0 3.7  CL 107 109 105 103 107 106  CO2 24 25 21* 26 26 25   GLUCOSE 123* 110* 178* 101* 100* 121*  BUN 39* 39* 42* 44* 43* 44*  CREATININE 2.02* 1.84* 1.80* 1.91* 1.88* 2.09*  CALCIUM 7.3* 7.6* 7.5* 7.6* 7.5* 7.5*  MG 1.6*  --   --  2.3 2.0  --    Liver Function Tests:  Recent Labs Lab 08/18/15 2125 08/22/15 1452 08/25/15 0306  AST 27 35 102*  ALT 15 15 79*  ALKPHOS 52 51 52  BILITOT 0.3 0.2* 0.4  PROT 3.8* 3.8* 3.9*  ALBUMIN <1.0* <1.0* <1.0*   No results for input(s): LIPASE, AMYLASE in the last 168 hours. No results for input(s): AMMONIA in the last 168 hours. CBC:  Recent Labs Lab 08/18/15 1810  08/20/15 0300 08/21/15 0328 08/22/15 0312 08/23/15 0515 08/25/15 0306  WBC 3.9*  < > 3.4* 3.7* 3.0* 3.6* 3.6*  NEUTROABS 3.5  --   --   --   --   --  3.2  HGB 8.3*  < > 7.8* 7.6* 9.7* 9.7* 9.4*  HCT 26.4*  < > 24.1* 24.0* 29.4* 29.7* 28.7*  MCV 94.6  < > 95.3 94.9 90.5 89.7 92.0  PLT 249  < > 224 245 224 242 231  < > = values in  this interval not displayed. Cardiac Enzymes:  Recent Labs Lab 08/18/15 2125 08/19/15 0229 08/19/15 0815  TROPONINI 0.23* 0.21* 0.23*   BNP: Invalid input(s): POCBNP CBG: No results for input(s): GLUCAP in the last 168 hours.  Recent Results (from the past 240 hour(s))  Culture, blood (routine x 2)     Status: None (Preliminary result)   Collection Time: 08/22/15  2:50 PM  Result Value Ref Range Status   Specimen Description BLOOD RIGHT HAND  Final   Special Requests BOTTLES DRAWN  AEROBIC ONLY  2CC  Final   Culture NO GROWTH 2 DAYS  Final   Report Status PENDING  Incomplete  Urine culture     Status: None   Collection Time: 08/22/15  3:46 PM  Result Value Ref Range Status   Specimen Description URINE, CLEAN CATCH  Final   Special Requests Normal  Final   Culture   Final    40,000 COLONIES/ml GROUP B STREP(S.AGALACTIAE)ISOLATED TESTING AGAINST S. AGALACTIAE NOT ROUTINELY PERFORMED DUE TO PREDICTABILITY OF AMP/PEN/VAN SUSCEPTIBILITY. 10,000 COLONIES/mL [place]    Report Status 08/24/2015 FINAL  Final   Organism ID, Bacteria KLUYVERA CRYOCRESCENS  Final      Susceptibility   Kluyvera cryocrescens - MIC*    AMPICILLIN 8 RESISTANT Resistant     CEFAZOLIN <=4 RESISTANT Resistant     CEFTRIAXONE <=1 SENSITIVE Sensitive     CIPROFLOXACIN <=0.25 SENSITIVE Sensitive     GENTAMICIN <=1 SENSITIVE Sensitive     IMIPENEM <=0.25 SENSITIVE Sensitive     NITROFURANTOIN <=16 SENSITIVE Sensitive     TRIMETH/SULFA <=20 SENSITIVE Sensitive     AMPICILLIN/SULBACTAM <=2 SENSITIVE Sensitive     * 10,000 COLONIES/mL KLUYVERA CRYOCRESCENS  C difficile quick scan w PCR reflex     Status: None   Collection Time: 08/24/15  8:04 PM  Result Value Ref Range Status   C Diff antigen NEGATIVE NEGATIVE Final   C Diff toxin NEGATIVE NEGATIVE Final   C Diff interpretation Negative for toxigenic C. difficile  Final     Scheduled Meds: . aspirin EC  81 mg Oral Daily  . atorvastatin  20 mg Oral Daily  . carvedilol  3.125 mg Oral BID WC  . cefTRIAXone (ROCEPHIN)  IV  1 g Intravenous Q24H  . ezetimibe  10 mg Oral Daily  . feeding supplement (ENSURE ENLIVE)  237 mL Oral BID BM  . heparin  5,000 Units Subcutaneous 3 times per day  . levothyroxine  75 mcg Oral QAC breakfast  . predniSONE  10 mg Oral QODAY  . sodium chloride  3 mL Intravenous Q12H  . torsemide  100 mg Oral Daily   Continuous Infusions:    TAT, DAVID, DO  [facility] Pager [phone]  If 7PM-7AM,  please contact night-coverage [email or web address] Password [email or web address] 08/25/2015, 11:49 AM   LOS: 7 days

## 2015-08-25 NOTE — Progress Notes (Signed)
[facility] Progress Note    Assessment/ Plan:   1. CKD Stage IV/Membranous GN/Nephrotic Syndrome: Edema improved. She made 965ml of urine over last 24 hours. Cr 2.09 from 1.88 yesterday (baseline around 1.7). On home PO torsemide. On Prednisone 10 mg daily. Cytoxan discontinued. No emergent HD needs. Will need to follow up with Dr. Arty Baumgartner.  2. UTI with sepsis: Fever to 102 yesterday. Blood culture no growth and urine culture with group b strep and kluyvera cryocrescens. CXR with left base atelectasis. Cytoxan discontinued. Abx per primary. 2. HTN: On Coreg 3.125 mg BID.  3. Anemia: Hgb stable. Received 1u pRBC 9/26. Iron low normal at 29. Saturation unable to be calculated. May need increase in EPO.   Subjective:   She reports she feels well today and wants to go home. No new complaints. Denies dyspnea.   Objective:   BP 156/64 mmHg  Pulse 76  Temp(Src) 99.1 F (37.3 C) (Oral)  Resp 18  Ht 5' (1.524 m)  Wt 139 lb 6.4 oz (63.231 kg)  BMI 27.22 kg/m2  SpO2 91%  Intake/Output Summary (Last 24 hours) at 08/25/15 1150 Last data filed at 08/24/15 2200  Gross per 24 hour  Intake    591 ml  Output    876 ml  Net   -285 ml   Weight change: -3.2 oz (-0.091 kg)  Physical Exam: Gen: elderly woman resting in bed CVS: RRR, 2/6 systolic murmur Resp: CTA bilaterally, breaths non-labored at rest Abd: BS+, soft, non-tender Ext: trace to 1+ pitting edema in lower extremities   Imaging: Ct Abdomen Pelvis Wo Contrast  08/24/2015   CLINICAL DATA:  Fever of unknown origin. Increasing abdominal girth, lower extremity edema and shortness of breath. History of CAD, stage III CKD and nephrotic syndrome.  EXAM: CT CHEST, ABDOMEN AND PELVIS WITHOUT CONTRAST  TECHNIQUE: Multidetector CT imaging of the chest, abdomen and pelvis was performed following the standard protocol without IV contrast.  COMPARISON:  08/22/2015 chest radiograph.  FINDINGS: CT CHEST FINDINGS  Mediastinum/Nodes:  Normal heart size. Minimal pericardial fluid/thickening. There is atherosclerosis of the thoracic aorta, the great vessels of the mediastinum and the coronary arteries, including calcified atherosclerotic plaque in the left anterior descending and right coronary arteries. Great vessels are normal in course and caliber. Normal visualized thyroid. Normal esophagus. No axillary, mediastinal or gross hilar lymphadenopathy, noting limited sensitivity for the detection of hilar adenopathy on this noncontrast study.  Lungs/Pleura: No pneumothorax. Small layering bilateral pleural effusions. Associated passive atelectasis in the dependent lower lobes and dependent basilar right upper lobe. Mild ground-glass opacity and interlobular septal thickening in both upper lobes is most in keeping with mild pulmonary edema. There is a 1.9 x 0.7 cm focus of subpleural consolidation in the medial left upper lobe (series 205/image 16).  Musculoskeletal: No aggressive appearing focal osseous lesions. Moderate degenerative changes in the thoracic spine.  CT ABDOMEN AND PELVIS FINDINGS  Hepatobiliary: Normal liver with no liver mass. Normal gallbladder with no radiopaque cholelithiasis. No biliary ductal dilatation.  Pancreas: Normal, with no mass or duct dilation.  Spleen: Normal size. No mass.  Adrenals/Urinary Tract: Normal adrenals. Normal kidneys with no nephrolithiasis, no hydronephrosis and no contour-deforming renal mass. Collapsed and grossly normal urinary bladder.  Stomach/Bowel: Small hiatal hernia. Otherwise collapsed and grossly normal stomach. Normal caliber small bowel with no small bowel wall thickening. Appendix is not discretely visualized. There is a masslike 2.4 x 2.1 cm focus of colonic wall thickening in the posterior distal  transverse colon (series 201/image 56 and series 203/image 28). Within the adjacent gastro-colic ligament, there is ill-defined mixed density material measuring 4.8 x 3.0 cm in maximum axial  dimensions (series 201/image 57), which contains internal foci of fat density and hyperdensity, and which is confluent with the focal transverse colonic wall thickening. No extraluminal gas. The hyperdense material in the gastrocolic ligament is of uncertain etiology and could represent calcification (such as the sequela of prior infectious or inflammatory episode) or oral contrast (from prior leak as no oral contrast has reached the transverse colon on this scan). Otherwise normal large bowel.  Vascular/Lymphatic: Atherosclerotic nonaneurysmal abdominal aorta. No pathologically enlarged lymph nodes in the abdomen or pelvis.  Reproductive: Grossly normal uterus.  No adnexal mass.  Other: No pneumoperitoneum.  Small volume ascites.  Musculoskeletal: Marked degenerative changes in the lumbar spine. Mild anasarca in the dependent subcutaneous soft tissues of the lower chest, flanks and pelvis.  IMPRESSION: 1. Small 1.9 x 0.7 cm focus of subpleural consolidation in the medial left upper lobe, indeterminate, possibly a small focus of infection. Recommend attention to this focus on a follow-up post treatment chest CT in 2-3 months. 2. Evidence of third-spacing, including small bilateral pleural effusions, small volume ascites, mild pulmonary edema and mild anasarca. 3. Focal colonic wall thickening in the posterior distal transverse colon, cannot exclude colonic neoplasm. Recommend correlation with colonoscopy. 4. Nonspecific mixed density material in the gastro-colic ligament adjacent to the focus of colonic wall thickening in the transverse colon. Favor the sequela of prior infection or prior perforation. No free air on this study to suggest recent perforation. 5.  Atherosclerosis, including two-vessel coronary artery disease. 6. Small hiatal hernia.   Electronically Signed   By: Ilona Sorrel M.D.   On: 08/24/2015 19:12   Ct Chest Wo Contrast  08/24/2015   CLINICAL DATA:  Fever of unknown origin. Increasing abdominal  girth, lower extremity edema and shortness of breath. History of CAD, stage III CKD and nephrotic syndrome.  EXAM: CT CHEST, ABDOMEN AND PELVIS WITHOUT CONTRAST  TECHNIQUE: Multidetector CT imaging of the chest, abdomen and pelvis was performed following the standard protocol without IV contrast.  COMPARISON:  08/22/2015 chest radiograph.  FINDINGS: CT CHEST FINDINGS  Mediastinum/Nodes: Normal heart size. Minimal pericardial fluid/thickening. There is atherosclerosis of the thoracic aorta, the great vessels of the mediastinum and the coronary arteries, including calcified atherosclerotic plaque in the left anterior descending and right coronary arteries. Great vessels are normal in course and caliber. Normal visualized thyroid. Normal esophagus. No axillary, mediastinal or gross hilar lymphadenopathy, noting limited sensitivity for the detection of hilar adenopathy on this noncontrast study.  Lungs/Pleura: No pneumothorax. Small layering bilateral pleural effusions. Associated passive atelectasis in the dependent lower lobes and dependent basilar right upper lobe. Mild ground-glass opacity and interlobular septal thickening in both upper lobes is most in keeping with mild pulmonary edema. There is a 1.9 x 0.7 cm focus of subpleural consolidation in the medial left upper lobe (series 205/image 16).  Musculoskeletal: No aggressive appearing focal osseous lesions. Moderate degenerative changes in the thoracic spine.  CT ABDOMEN AND PELVIS FINDINGS  Hepatobiliary: Normal liver with no liver mass. Normal gallbladder with no radiopaque cholelithiasis. No biliary ductal dilatation.  Pancreas: Normal, with no mass or duct dilation.  Spleen: Normal size. No mass.  Adrenals/Urinary Tract: Normal adrenals. Normal kidneys with no nephrolithiasis, no hydronephrosis and no contour-deforming renal mass. Collapsed and grossly normal urinary bladder.  Stomach/Bowel: Small hiatal hernia. Otherwise collapsed and  grossly normal  stomach. Normal caliber small bowel with no small bowel wall thickening. Appendix is not discretely visualized. There is a masslike 2.4 x 2.1 cm focus of colonic wall thickening in the posterior distal transverse colon (series 201/image 56 and series 203/image 28). Within the adjacent gastro-colic ligament, there is ill-defined mixed density material measuring 4.8 x 3.0 cm in maximum axial dimensions (series 201/image 57), which contains internal foci of fat density and hyperdensity, and which is confluent with the focal transverse colonic wall thickening. No extraluminal gas. The hyperdense material in the gastrocolic ligament is of uncertain etiology and could represent calcification (such as the sequela of prior infectious or inflammatory episode) or oral contrast (from prior leak as no oral contrast has reached the transverse colon on this scan). Otherwise normal large bowel.  Vascular/Lymphatic: Atherosclerotic nonaneurysmal abdominal aorta. No pathologically enlarged lymph nodes in the abdomen or pelvis.  Reproductive: Grossly normal uterus.  No adnexal mass.  Other: No pneumoperitoneum.  Small volume ascites.  Musculoskeletal: Marked degenerative changes in the lumbar spine. Mild anasarca in the dependent subcutaneous soft tissues of the lower chest, flanks and pelvis.  IMPRESSION: 1. Small 1.9 x 0.7 cm focus of subpleural consolidation in the medial left upper lobe, indeterminate, possibly a small focus of infection. Recommend attention to this focus on a follow-up post treatment chest CT in 2-3 months. 2. Evidence of third-spacing, including small bilateral pleural effusions, small volume ascites, mild pulmonary edema and mild anasarca. 3. Focal colonic wall thickening in the posterior distal transverse colon, cannot exclude colonic neoplasm. Recommend correlation with colonoscopy. 4. Nonspecific mixed density material in the gastro-colic ligament adjacent to the focus of colonic wall thickening in the  transverse colon. Favor the sequela of prior infection or prior perforation. No free air on this study to suggest recent perforation. 5.  Atherosclerosis, including two-vessel coronary artery disease. 6. Small hiatal hernia.   Electronically Signed   By: Ilona Sorrel M.D.   On: 08/24/2015 19:12    Labs: BMET  Recent Labs Lab 08/19/15 0815 08/20/15 0300 08/21/15 0328 08/22/15 1452 08/23/15 0515 08/24/15 0524 08/25/15 0306  NA 138 138 140 137 137 140 139  K 3.4* 3.4* 3.9 2.8* 3.1* 4.0 3.7  CL 108 107 109 105 103 107 106  CO2 22 24 25  21* 26 26 25   GLUCOSE 110* 123* 110* 178* 101* 100* 121*  BUN 39* 39* 39* 42* 44* 43* 44*  CREATININE 1.96* 2.02* 1.84* 1.80* 1.91* 1.88* 2.09*  CALCIUM 7.4* 7.3* 7.6* 7.5* 7.6* 7.5* 7.5*   CBC  Recent Labs Lab 08/18/15 1810  08/21/15 0328 08/22/15 0312 08/23/15 0515 08/25/15 0306  WBC 3.9*  < > 3.7* 3.0* 3.6* 3.6*  NEUTROABS 3.5  --   --   --   --  3.2  HGB 8.3*  < > 7.6* 9.7* 9.7* 9.4*  HCT 26.4*  < > 24.0* 29.4* 29.7* 28.7*  MCV 94.6  < > 94.9 90.5 89.7 92.0  PLT 249  < > 245 224 242 231  < > = values in this interval not displayed.  Medications:    . aspirin EC  81 mg Oral Daily  . atorvastatin  20 mg Oral Daily  . carvedilol  3.125 mg Oral BID WC  . cefTRIAXone (ROCEPHIN)  IV  1 g Intravenous Q24H  . ezetimibe  10 mg Oral Daily  . feeding supplement (ENSURE ENLIVE)  237 mL Oral BID BM  . heparin  5,000 Units Subcutaneous 3 times  per day  . levothyroxine  75 mcg Oral QAC breakfast  . predniSONE  10 mg Oral QODAY  . sodium chloride  3 mL Intravenous Q12H  . torsemide  100 mg Oral Daily      Jacques Earthly, MD Internal Medicine Resident, PGY 2 08/25/2015, 11:50 AM

## 2015-08-26 ENCOUNTER — Inpatient Hospital Stay (HOSPITAL_COMMUNITY): Payer: Medicare Other

## 2015-08-26 DIAGNOSIS — R935 Abnormal findings on diagnostic imaging of other abdominal regions, including retroperitoneum: Secondary | ICD-10-CM

## 2015-08-26 DIAGNOSIS — A409 Streptococcal sepsis, unspecified: Secondary | ICD-10-CM

## 2015-08-26 LAB — BASIC METABOLIC PANEL
ANION GAP: 9 (ref 5–15)
BUN: 45 mg/dL — ABNORMAL HIGH (ref 6–20)
CHLORIDE: 105 mmol/L (ref 101–111)
CO2: 23 mmol/L (ref 22–32)
Calcium: 7.4 mg/dL — ABNORMAL LOW (ref 8.9–10.3)
Creatinine, Ser: 1.9 mg/dL — ABNORMAL HIGH (ref 0.44–1.00)
GFR calc Af Amer: 27 mL/min — ABNORMAL LOW (ref >60)
GFR, EST NON AFRICAN AMERICAN: 23 mL/min — AB (ref >60)
GLUCOSE: 166 mg/dL — AB (ref 65–99)
POTASSIUM: 3.3 mmol/L — AB (ref 3.5–5.1)
Sodium: 137 mmol/L (ref 135–145)

## 2015-08-26 LAB — BRAIN NATRIURETIC PEPTIDE: B Natriuretic Peptide: 280.7 pg/mL — ABNORMAL HIGH (ref 0.0–100.0)

## 2015-08-26 LAB — PROCALCITONIN: PROCALCITONIN: 0.44 ng/mL

## 2015-08-26 MED ORDER — FUROSEMIDE 10 MG/ML IJ SOLN
80.0000 mg | Freq: Two times a day (BID) | INTRAMUSCULAR | Status: DC
  Administered 2015-08-26: 80 mg via INTRAVENOUS
  Filled 2015-08-26: qty 8

## 2015-08-26 MED ORDER — FUROSEMIDE 10 MG/ML IJ SOLN
80.0000 mg | Freq: Two times a day (BID) | INTRAMUSCULAR | Status: DC
  Administered 2015-08-27: 80 mg via INTRAVENOUS
  Filled 2015-08-26: qty 8

## 2015-08-26 NOTE — Progress Notes (Signed)
Progress Note   Subjective  **No complaints*   Objective  Vital signs in last 24 hours: Temp:  [99 F (37.2 C)-100.2 F (37.9 C)] 99 F (37.2 C) (10/01 0615) Pulse Rate:  [90] 90 (09/30 2047) Resp:  [16] 16 (09/30 2047) BP: (132-157)/(56-74) 138/74 mmHg (10/01 0615) SpO2:  [87 %-92 %] 91 % (10/01 0615) Weight:  [138 lb 14.2 oz (63 kg)] 138 lb 14.2 oz (63 kg) (09/30 2047) Last BM Date: 08/25/15  General: Alert, well-developed,  in NAD Heart:  Regular rate and rhythm; no murmurs Chest: Clear to ascultation bilaterally Abdomen:  Soft, nontender and nondistended. Normal bowel sounds, without guarding, and without rebound.   Extremities:  Without edema. Neurologic:  Alert and  oriented x4; grossly normal neurologically. Psych:  Alert and cooperative. Normal mood and affect.  Intake/Output from previous day: 09/30 0701 - 10/01 0700 In: 800 [P.O.:350; IV Piggyback:450] Out: 900 [Urine:900] Intake/Output this shift: Total I/O In: 240 [P.O.:240] Out: 0   Lab Results:  Recent Labs  08/25/15 0306  WBC 3.6*  HGB 9.4*  HCT 28.7*  PLT 231   BMET  Recent Labs  08/24/15 0524 08/25/15 0306  NA 140 139  K 4.0 3.7  CL 107 106  CO2 26 25  GLUCOSE 100* 121*  BUN 43* 44*  CREATININE 1.88* 2.09*  CALCIUM 7.5* 7.5*   LFT  Recent Labs  08/25/15 0306  PROT 3.9*  ALBUMIN <1.0*  AST 102*  ALT 79*  ALKPHOS 52  BILITOT 0.4   PT/INR No results for input(s): LABPROT, INR in the last 72 hours. Hepatitis Panel No results for input(s): HEPBSAG, HCVAB, HEPAIGM, HEPBIGM in the last 72 hours.  Studies/Results: Ct Abdomen Pelvis Wo Contrast  08/24/2015   CLINICAL DATA:  Fever of unknown origin. Increasing abdominal girth, lower extremity edema and shortness of breath. History of CAD, stage III CKD and nephrotic syndrome.  EXAM: CT CHEST, ABDOMEN AND PELVIS WITHOUT CONTRAST  TECHNIQUE: Multidetector CT imaging of the chest, abdomen and pelvis was performed following the  standard protocol without IV contrast.  COMPARISON:  08/22/2015 chest radiograph.  FINDINGS: CT CHEST FINDINGS  Mediastinum/Nodes: Normal heart size. Minimal pericardial fluid/thickening. There is atherosclerosis of the thoracic aorta, the great vessels of the mediastinum and the coronary arteries, including calcified atherosclerotic plaque in the left anterior descending and right coronary arteries. Great vessels are normal in course and caliber. Normal visualized thyroid. Normal esophagus. No axillary, mediastinal or gross hilar lymphadenopathy, noting limited sensitivity for the detection of hilar adenopathy on this noncontrast study.  Lungs/Pleura: No pneumothorax. Small layering bilateral pleural effusions. Associated passive atelectasis in the dependent lower lobes and dependent basilar right upper lobe. Mild ground-glass opacity and interlobular septal thickening in both upper lobes is most in keeping with mild pulmonary edema. There is a 1.9 x 0.7 cm focus of subpleural consolidation in the medial left upper lobe (series 205/image 16).  Musculoskeletal: No aggressive appearing focal osseous lesions. Moderate degenerative changes in the thoracic spine.  CT ABDOMEN AND PELVIS FINDINGS  Hepatobiliary: Normal liver with no liver mass. Normal gallbladder with no radiopaque cholelithiasis. No biliary ductal dilatation.  Pancreas: Normal, with no mass or duct dilation.  Spleen: Normal size. No mass.  Adrenals/Urinary Tract: Normal adrenals. Normal kidneys with no nephrolithiasis, no hydronephrosis and no contour-deforming renal mass. Collapsed and grossly normal urinary bladder.  Stomach/Bowel: Small hiatal hernia. Otherwise collapsed and grossly normal stomach. Normal caliber small bowel with no small bowel wall thickening.  Appendix is not discretely visualized. There is a masslike 2.4 x 2.1 cm focus of colonic wall thickening in the posterior distal transverse colon (series 201/image 56 and series 203/image 28).  Within the adjacent gastro-colic ligament, there is ill-defined mixed density material measuring 4.8 x 3.0 cm in maximum axial dimensions (series 201/image 57), which contains internal foci of fat density and hyperdensity, and which is confluent with the focal transverse colonic wall thickening. No extraluminal gas. The hyperdense material in the gastrocolic ligament is of uncertain etiology and could represent calcification (such as the sequela of prior infectious or inflammatory episode) or oral contrast (from prior leak as no oral contrast has reached the transverse colon on this scan). Otherwise normal large bowel.  Vascular/Lymphatic: Atherosclerotic nonaneurysmal abdominal aorta. No pathologically enlarged lymph nodes in the abdomen or pelvis.  Reproductive: Grossly normal uterus.  No adnexal mass.  Other: No pneumoperitoneum.  Small volume ascites.  Musculoskeletal: Marked degenerative changes in the lumbar spine. Mild anasarca in the dependent subcutaneous soft tissues of the lower chest, flanks and pelvis.  IMPRESSION: 1. Small 1.9 x 0.7 cm focus of subpleural consolidation in the medial left upper lobe, indeterminate, possibly a small focus of infection. Recommend attention to this focus on a follow-up post treatment chest CT in 2-3 months. 2. Evidence of third-spacing, including small bilateral pleural effusions, small volume ascites, mild pulmonary edema and mild anasarca. 3. Focal colonic wall thickening in the posterior distal transverse colon, cannot exclude colonic neoplasm. Recommend correlation with colonoscopy. 4. Nonspecific mixed density material in the gastro-colic ligament adjacent to the focus of colonic wall thickening in the transverse colon. Favor the sequela of prior infection or prior perforation. No free air on this study to suggest recent perforation. 5.  Atherosclerosis, including two-vessel coronary artery disease. 6. Small hiatal hernia.   Electronically Signed   By: Ilona Sorrel  M.D.   On: 08/24/2015 19:12   Ct Chest Wo Contrast  08/24/2015   CLINICAL DATA:  Fever of unknown origin. Increasing abdominal girth, lower extremity edema and shortness of breath. History of CAD, stage III CKD and nephrotic syndrome.  EXAM: CT CHEST, ABDOMEN AND PELVIS WITHOUT CONTRAST  TECHNIQUE: Multidetector CT imaging of the chest, abdomen and pelvis was performed following the standard protocol without IV contrast.  COMPARISON:  08/22/2015 chest radiograph.  FINDINGS: CT CHEST FINDINGS  Mediastinum/Nodes: Normal heart size. Minimal pericardial fluid/thickening. There is atherosclerosis of the thoracic aorta, the great vessels of the mediastinum and the coronary arteries, including calcified atherosclerotic plaque in the left anterior descending and right coronary arteries. Great vessels are normal in course and caliber. Normal visualized thyroid. Normal esophagus. No axillary, mediastinal or gross hilar lymphadenopathy, noting limited sensitivity for the detection of hilar adenopathy on this noncontrast study.  Lungs/Pleura: No pneumothorax. Small layering bilateral pleural effusions. Associated passive atelectasis in the dependent lower lobes and dependent basilar right upper lobe. Mild ground-glass opacity and interlobular septal thickening in both upper lobes is most in keeping with mild pulmonary edema. There is a 1.9 x 0.7 cm focus of subpleural consolidation in the medial left upper lobe (series 205/image 16).  Musculoskeletal: No aggressive appearing focal osseous lesions. Moderate degenerative changes in the thoracic spine.  CT ABDOMEN AND PELVIS FINDINGS  Hepatobiliary: Normal liver with no liver mass. Normal gallbladder with no radiopaque cholelithiasis. No biliary ductal dilatation.  Pancreas: Normal, with no mass or duct dilation.  Spleen: Normal size. No mass.  Adrenals/Urinary Tract: Normal adrenals. Normal kidneys with no  nephrolithiasis, no hydronephrosis and no contour-deforming renal mass.  Collapsed and grossly normal urinary bladder.  Stomach/Bowel: Small hiatal hernia. Otherwise collapsed and grossly normal stomach. Normal caliber small bowel with no small bowel wall thickening. Appendix is not discretely visualized. There is a masslike 2.4 x 2.1 cm focus of colonic wall thickening in the posterior distal transverse colon (series 201/image 56 and series 203/image 28). Within the adjacent gastro-colic ligament, there is ill-defined mixed density material measuring 4.8 x 3.0 cm in maximum axial dimensions (series 201/image 57), which contains internal foci of fat density and hyperdensity, and which is confluent with the focal transverse colonic wall thickening. No extraluminal gas. The hyperdense material in the gastrocolic ligament is of uncertain etiology and could represent calcification (such as the sequela of prior infectious or inflammatory episode) or oral contrast (from prior leak as no oral contrast has reached the transverse colon on this scan). Otherwise normal large bowel.  Vascular/Lymphatic: Atherosclerotic nonaneurysmal abdominal aorta. No pathologically enlarged lymph nodes in the abdomen or pelvis.  Reproductive: Grossly normal uterus.  No adnexal mass.  Other: No pneumoperitoneum.  Small volume ascites.  Musculoskeletal: Marked degenerative changes in the lumbar spine. Mild anasarca in the dependent subcutaneous soft tissues of the lower chest, flanks and pelvis.  IMPRESSION: 1. Small 1.9 x 0.7 cm focus of subpleural consolidation in the medial left upper lobe, indeterminate, possibly a small focus of infection. Recommend attention to this focus on a follow-up post treatment chest CT in 2-3 months. 2. Evidence of third-spacing, including small bilateral pleural effusions, small volume ascites, mild pulmonary edema and mild anasarca. 3. Focal colonic wall thickening in the posterior distal transverse colon, cannot exclude colonic neoplasm. Recommend correlation with colonoscopy. 4.  Nonspecific mixed density material in the gastro-colic ligament adjacent to the focus of colonic wall thickening in the transverse colon. Favor the sequela of prior infection or prior perforation. No free air on this study to suggest recent perforation. 5.  Atherosclerosis, including two-vessel coronary artery disease. 6. Small hiatal hernia.   Electronically Signed   By: Ilona Sorrel M.D.   On: 08/24/2015 19:12      Assessment & Plan  *Nonspecific, transverse colon thickening with what looks like adjacent phlegmon.  Suspect this is an inflammatory process.  Would continue antibiotics, repeat CT in 2-3 weeks. ** Principal Problem:   Sepsis Active Problems:   CAD (coronary artery disease)   Hyperlipemia   Hypothyroidism-labs 01/2015.   Anasarca   AKI (acute kidney injury)   Hypokalemia   Acute on chronic diastolic HF (heart failure)   Acute on chronic diastolic ACC/AHA stage C congestive heart failure   Anemia of chronic disease   Acute renal failure superimposed on stage 4 chronic kidney disease   Nephrotic syndrome   FUO (fever of unknown origin)   Abnormal CT of the abdomen     LOS: 8 days   Erskine Emery  08/26/2015, 11:07 AM [phone] 8a-5p weekdays

## 2015-08-26 NOTE — Progress Notes (Signed)
PROGRESS NOTE  Erika [name] KXF:[id number] DOB: 1928/08/24 DOA: 08/18/2015 PCP: Precious Reel, MD  Brief history A 81 year old female with a history of CAD with MI in 1990 with Single vessel disease with a severe stenosis in the RCA which had recannalized since prior study in 1990, hyperlipidemia, hypothyroidism, CKD stage III, nephrotic syndrome with biopsy-proven membranous nephropathy presented on 08/18/2015 with increasing abdominal girth, lower extremity edema, and shortness of breath. The patient was up 13 pounds above her dry weight at 146 pounds on the day of admission. The patient was started on intravenous furosemide with good clinical response. Nephrology was consulted secondary to the patient's CKD and nephrotic syndrome. The patient had been previously on Cytoxan and prednisone which she did not start taking until July 2016. She was recently discharged in August 2016 secondary to syncope. At that time, her diuretics and ARB were held. She was hydrated and improved clinically. At the time of discharge, the patient's torsemide was started at a lower dose, 80 mg every afternoon, 100 mg every morning Unfortunately, he began developing fevers on 08/19/2015 up to 101.67F. Assessment/Plan: Fever/leukopenia/lactic acidosis-->Sepsis -still with fevers despite abx -Unfortunately, the patient has been on Cytoxan which may be blunting her inflammatory response -fever persistent -Cytoxan was discontinued after her 08/22/2015 dose -Lactic acid 2.6 --> 2.0  -Procalcitonin 0.38 -->0.44  -CXR without consolidation -CT chest--small left upper lobe subpleural consolidation; bilateral mild groundglass opacities more consistent with edema -CT abd/pelvis--masslike colon wall thickening in the transverse colon, ill-defined mixed density material adjacent to the transverse colon questioning leak versus abscess versus inflammatory response from prior infection -Concerned that CT abdomen pelvis  findings may be contributing to the patient's persistent fever -appreciate GI-->tx with abx and repeat CT in 2-3 weeks -general surgery for evaluation--pt refused surgery of any kind -c diff screen--negative -no meningismus on exam -lower extremity duplex r/o DVT -CBC with diff and LFTs in am Colonic wall thickening with adjacent phlegmon -Patient was seen by gastroenterology -She does not want any surgery nor colonoscopy at this time -continue empiric treatment with antibiotics -Added metronidazole, continue CTX UTI  -d/c zosyn -Continue ceftriaxone -inflammatory response, pyuria may be blunted by pt on cytoxan Acute on chronic renal failure (CKD 4)/ nephrotic syndrome/membranous glomerulonephritis -per nephrology -continue prednisone  -Cyclophosphamide was discontinued after 08/22/2015 dose -will likely need to tolerate worse renal function for better respiratory status  Acute on chronic diastolic CHF -Baseline weight at approximately 132 -Deferred her diuretics dosing to nephrology and cardiology -Continue carvedilol -NEG 5 pounds since admission -08/26/2015 Increasingly dyspneic with oxygen desaturation -08/26/15--order CXR--interstitial prominence -restart IV lasix 80 mg bid Acute respiratory failure with hypoxia -placed back on oxygen 08/26/15 -08/26/15--stable presently on 2L without increased WOB Leukopenia  -Secondary to Cytoxan--last dose given 08/22/2015 Elevated troponin -Secondary to demand ischemia in the setting of decompensated CHF and worsening renal function Anemia CKD  -Received 1 unit PRBC 08/21/2015  Hypothyroidism  -TSH 1.855  -Continue Synthroid  Hyperlipidemia  -Continue statin  CAD with history of MI -Continue aspirin Hypokalemia -Repleted  Disposition Plan: Home when work up complete Communication--husband updated at bedside       Procedures/Studies: Ct Abdomen Pelvis Wo Contrast  08/24/2015   CLINICAL DATA:  Fever of  unknown origin. Increasing abdominal girth, lower extremity edema and shortness of breath. History of CAD, stage III CKD and nephrotic syndrome.  EXAM: CT CHEST, ABDOMEN AND PELVIS WITHOUT CONTRAST  TECHNIQUE: Multidetector CT imaging  of the chest, abdomen and pelvis was performed following the standard protocol without IV contrast.  COMPARISON:  08/22/2015 chest radiograph.  FINDINGS: CT CHEST FINDINGS  Mediastinum/Nodes: Normal heart size. Minimal pericardial fluid/thickening. There is atherosclerosis of the thoracic aorta, the great vessels of the mediastinum and the coronary arteries, including calcified atherosclerotic plaque in the left anterior descending and right coronary arteries. Great vessels are normal in course and caliber. Normal visualized thyroid. Normal esophagus. No axillary, mediastinal or gross hilar lymphadenopathy, noting limited sensitivity for the detection of hilar adenopathy on this noncontrast study.  Lungs/Pleura: No pneumothorax. Small layering bilateral pleural effusions. Associated passive atelectasis in the dependent lower lobes and dependent basilar right upper lobe. Mild ground-glass opacity and interlobular septal thickening in both upper lobes is most in keeping with mild pulmonary edema. There is a 1.9 x 0.7 cm focus of subpleural consolidation in the medial left upper lobe (series 205/image 16).  Musculoskeletal: No aggressive appearing focal osseous lesions. Moderate degenerative changes in the thoracic spine.  CT ABDOMEN AND PELVIS FINDINGS  Hepatobiliary: Normal liver with no liver mass. Normal gallbladder with no radiopaque cholelithiasis. No biliary ductal dilatation.  Pancreas: Normal, with no mass or duct dilation.  Spleen: Normal size. No mass.  Adrenals/Urinary Tract: Normal adrenals. Normal kidneys with no nephrolithiasis, no hydronephrosis and no contour-deforming renal mass. Collapsed and grossly normal urinary bladder.  Stomach/Bowel: Small hiatal hernia. Otherwise  collapsed and grossly normal stomach. Normal caliber small bowel with no small bowel wall thickening. Appendix is not discretely visualized. There is a masslike 2.4 x 2.1 cm focus of colonic wall thickening in the posterior distal transverse colon (series 201/image 56 and series 203/image 28). Within the adjacent gastro-colic ligament, there is ill-defined mixed density material measuring 4.8 x 3.0 cm in maximum axial dimensions (series 201/image 57), which contains internal foci of fat density and hyperdensity, and which is confluent with the focal transverse colonic wall thickening. No extraluminal gas. The hyperdense material in the gastrocolic ligament is of uncertain etiology and could represent calcification (such as the sequela of prior infectious or inflammatory episode) or oral contrast (from prior leak as no oral contrast has reached the transverse colon on this scan). Otherwise normal large bowel.  Vascular/Lymphatic: Atherosclerotic nonaneurysmal abdominal aorta. No pathologically enlarged lymph nodes in the abdomen or pelvis.  Reproductive: Grossly normal uterus.  No adnexal mass.  Other: No pneumoperitoneum.  Small volume ascites.  Musculoskeletal: Marked degenerative changes in the lumbar spine. Mild anasarca in the dependent subcutaneous soft tissues of the lower chest, flanks and pelvis.  IMPRESSION: 1. Small 1.9 x 0.7 cm focus of subpleural consolidation in the medial left upper lobe, indeterminate, possibly a small focus of infection. Recommend attention to this focus on a follow-up post treatment chest CT in 2-3 months. 2. Evidence of third-spacing, including small bilateral pleural effusions, small volume ascites, mild pulmonary edema and mild anasarca. 3. Focal colonic wall thickening in the posterior distal transverse colon, cannot exclude colonic neoplasm. Recommend correlation with colonoscopy. 4. Nonspecific mixed density material in the gastro-colic ligament adjacent to the focus of  colonic wall thickening in the transverse colon. Favor the sequela of prior infection or prior perforation. No free air on this study to suggest recent perforation. 5.  Atherosclerosis, including two-vessel coronary artery disease. 6. Small hiatal hernia.   Electronically Signed   By: Ilona Sorrel M.D.   On: 08/24/2015 19:12   Dg Chest 2 View  08/22/2015   CLINICAL DATA:  Fever with  sweats for 1 day  EXAM: CHEST  2 VIEW  COMPARISON:  August 18, 2015  FINDINGS: There is mild atelectasis in the left base. Lungs elsewhere clear. Heart size and pulmonary vascularity are normal. No adenopathy. There is degenerative change in the thoracic spine. There is calcification in both carotid arteries.  IMPRESSION: Left base atelectasis. No frank edema or consolidation. Calcification in each carotid artery.   Electronically Signed   By: Lowella Grip III M.D.   On: 08/22/2015 18:36   Dg Chest 2 View  08/18/2015   CLINICAL DATA:  Shortness of breath.  Symptoms today.  EXAM: CHEST  2 VIEW  COMPARISON:  07/17/2015  FINDINGS: Linear atelectasis in the lingula. Right lung is clear. Heart is upper limits normal in size. No effusions. No acute bony abnormality.  IMPRESSION: Lingular atelectasis.   Electronically Signed   By: Rolm Baptise M.D.   On: 08/18/2015 18:08   Ct Chest Wo Contrast  08/24/2015   CLINICAL DATA:  Fever of unknown origin. Increasing abdominal girth, lower extremity edema and shortness of breath. History of CAD, stage III CKD and nephrotic syndrome.  EXAM: CT CHEST, ABDOMEN AND PELVIS WITHOUT CONTRAST  TECHNIQUE: Multidetector CT imaging of the chest, abdomen and pelvis was performed following the standard protocol without IV contrast.  COMPARISON:  08/22/2015 chest radiograph.  FINDINGS: CT CHEST FINDINGS  Mediastinum/Nodes: Normal heart size. Minimal pericardial fluid/thickening. There is atherosclerosis of the thoracic aorta, the great vessels of the mediastinum and the coronary arteries, including  calcified atherosclerotic plaque in the left anterior descending and right coronary arteries. Great vessels are normal in course and caliber. Normal visualized thyroid. Normal esophagus. No axillary, mediastinal or gross hilar lymphadenopathy, noting limited sensitivity for the detection of hilar adenopathy on this noncontrast study.  Lungs/Pleura: No pneumothorax. Small layering bilateral pleural effusions. Associated passive atelectasis in the dependent lower lobes and dependent basilar right upper lobe. Mild ground-glass opacity and interlobular septal thickening in both upper lobes is most in keeping with mild pulmonary edema. There is a 1.9 x 0.7 cm focus of subpleural consolidation in the medial left upper lobe (series 205/image 16).  Musculoskeletal: No aggressive appearing focal osseous lesions. Moderate degenerative changes in the thoracic spine.  CT ABDOMEN AND PELVIS FINDINGS  Hepatobiliary: Normal liver with no liver mass. Normal gallbladder with no radiopaque cholelithiasis. No biliary ductal dilatation.  Pancreas: Normal, with no mass or duct dilation.  Spleen: Normal size. No mass.  Adrenals/Urinary Tract: Normal adrenals. Normal kidneys with no nephrolithiasis, no hydronephrosis and no contour-deforming renal mass. Collapsed and grossly normal urinary bladder.  Stomach/Bowel: Small hiatal hernia. Otherwise collapsed and grossly normal stomach. Normal caliber small bowel with no small bowel wall thickening. Appendix is not discretely visualized. There is a masslike 2.4 x 2.1 cm focus of colonic wall thickening in the posterior distal transverse colon (series 201/image 56 and series 203/image 28). Within the adjacent gastro-colic ligament, there is ill-defined mixed density material measuring 4.8 x 3.0 cm in maximum axial dimensions (series 201/image 57), which contains internal foci of fat density and hyperdensity, and which is confluent with the focal transverse colonic wall thickening. No  extraluminal gas. The hyperdense material in the gastrocolic ligament is of uncertain etiology and could represent calcification (such as the sequela of prior infectious or inflammatory episode) or oral contrast (from prior leak as no oral contrast has reached the transverse colon on this scan). Otherwise normal large bowel.  Vascular/Lymphatic: Atherosclerotic nonaneurysmal abdominal aorta. No pathologically enlarged  lymph nodes in the abdomen or pelvis.  Reproductive: Grossly normal uterus.  No adnexal mass.  Other: No pneumoperitoneum.  Small volume ascites.  Musculoskeletal: Marked degenerative changes in the lumbar spine. Mild anasarca in the dependent subcutaneous soft tissues of the lower chest, flanks and pelvis.  IMPRESSION: 1. Small 1.9 x 0.7 cm focus of subpleural consolidation in the medial left upper lobe, indeterminate, possibly a small focus of infection. Recommend attention to this focus on a follow-up post treatment chest CT in 2-3 months. 2. Evidence of third-spacing, including small bilateral pleural effusions, small volume ascites, mild pulmonary edema and mild anasarca. 3. Focal colonic wall thickening in the posterior distal transverse colon, cannot exclude colonic neoplasm. Recommend correlation with colonoscopy. 4. Nonspecific mixed density material in the gastro-colic ligament adjacent to the focus of colonic wall thickening in the transverse colon. Favor the sequela of prior infection or prior perforation. No free air on this study to suggest recent perforation. 5.  Atherosclerosis, including two-vessel coronary artery disease. 6. Small hiatal hernia.   Electronically Signed   By: Ilona Sorrel M.D.   On: 08/24/2015 19:12         Subjective: Patient complained of increasing shortness of breath today. The patient was hypoxic on room air. Denies any chest pain, nausea, vomiting, diarrhea, abdominal pain, dysuria, hematuria. No rashes. Good air movement.  Objective: Filed Vitals:    08/25/15 1224 08/25/15 1627 08/25/15 2047 08/26/15 0615  BP:  132/56 157/72 138/74  Pulse:  90 90   Temp:   100.2 F (37.9 C) 99 F (37.2 C)  TempSrc:   Oral Oral  Resp:   16   Height:      Weight:   63 kg (138 lb 14.2 oz)   SpO2: 92%  90% 91%    Intake/Output Summary (Last 24 hours) at 08/26/15 1146 Last data filed at 08/26/15 0840  Gross per 24 hour  Intake   1040 ml  Output    600 ml  Net    440 ml   Weight change: -0.231 kg (-8.2 oz) Exam:   General:  Pt is alert, follows commands appropriately, not in acute distress  HEENT: No icterus, No thrush, No neck mass, /AT  Cardiovascular: RRR, S1/S2, no rubs, no gallops  Respiratory: Bibasilar crackles. No wheezes  Abdomen: Soft/+BS, non tender, non distended, no guarding; no hepatosplenomegaly   Extremities: 1+LE edema, No lymphangitis, No petechiae, No rashes, no synovitis; no cyanosis or clubbing   Data Reviewed: Basic Metabolic Panel:  Recent Labs Lab 08/20/15 0300 08/21/15 0328 08/22/15 1452 08/23/15 0515 08/24/15 0524 08/25/15 0306  NA 138 140 137 137 140 139  K 3.4* 3.9 2.8* 3.1* 4.0 3.7  CL 107 109 105 103 107 106  CO2 24 25 21* 26 26 25   GLUCOSE 123* 110* 178* 101* 100* 121*  BUN 39* 39* 42* 44* 43* 44*  CREATININE 2.02* 1.84* 1.80* 1.91* 1.88* 2.09*  CALCIUM 7.3* 7.6* 7.5* 7.6* 7.5* 7.5*  MG 1.6*  --   --  2.3 2.0  --    Liver Function Tests:  Recent Labs Lab 08/22/15 1452 08/25/15 0306  AST 35 102*  ALT 15 79*  ALKPHOS 51 52  BILITOT 0.2* 0.4  PROT 3.8* 3.9*  ALBUMIN <1.0* <1.0*   No results for input(s): LIPASE, AMYLASE in the last 168 hours. No results for input(s): AMMONIA in the last 168 hours. CBC:  Recent Labs Lab 08/20/15 0300 08/21/15 0328 08/22/15 1610 08/23/15 0515 08/25/15 9604  WBC 3.4* 3.7* 3.0* 3.6* 3.6*  NEUTROABS  --   --   --   --  3.2  HGB 7.8* 7.6* 9.7* 9.7* 9.4*  HCT 24.1* 24.0* 29.4* 29.7* 28.7*  MCV 95.3 94.9 90.5 89.7 92.0  PLT 224 245 224 242 231     Cardiac Enzymes: No results for input(s): CKTOTAL, CKMB, CKMBINDEX, TROPONINI in the last 168 hours. BNP: Invalid input(s): POCBNP CBG: No results for input(s): GLUCAP in the last 168 hours.  Recent Results (from the past 240 hour(s))  Culture, blood (routine x 2)     Status: None (Preliminary result)   Collection Time: 08/22/15  2:50 PM  Result Value Ref Range Status   Specimen Description BLOOD RIGHT HAND  Final   Special Requests BOTTLES DRAWN AEROBIC ONLY  2CC  Final   Culture NO GROWTH 3 DAYS  Final   Report Status PENDING  Incomplete  Urine culture     Status: None   Collection Time: 08/22/15  3:46 PM  Result Value Ref Range Status   Specimen Description URINE, CLEAN CATCH  Final   Special Requests Normal  Final   Culture   Final    40,000 COLONIES/ml GROUP B STREP(S.AGALACTIAE)ISOLATED TESTING AGAINST S. AGALACTIAE NOT ROUTINELY PERFORMED DUE TO PREDICTABILITY OF AMP/PEN/VAN SUSCEPTIBILITY. 10,000 COLONIES/mL [place]    Report Status 08/24/2015 FINAL  Final   Organism ID, Bacteria KLUYVERA CRYOCRESCENS  Final      Susceptibility   Kluyvera cryocrescens - MIC*    AMPICILLIN 8 RESISTANT Resistant     CEFAZOLIN <=4 RESISTANT Resistant     CEFTRIAXONE <=1 SENSITIVE Sensitive     CIPROFLOXACIN <=0.25 SENSITIVE Sensitive     GENTAMICIN <=1 SENSITIVE Sensitive     IMIPENEM <=0.25 SENSITIVE Sensitive     NITROFURANTOIN <=16 SENSITIVE Sensitive     TRIMETH/SULFA <=20 SENSITIVE Sensitive     AMPICILLIN/SULBACTAM <=2 SENSITIVE Sensitive     * 10,000 COLONIES/mL KLUYVERA CRYOCRESCENS  C difficile quick scan w PCR reflex     Status: None   Collection Time: 08/24/15  8:04 PM  Result Value Ref Range Status   C Diff antigen NEGATIVE NEGATIVE Final   C Diff toxin NEGATIVE NEGATIVE Final   C Diff interpretation Negative for toxigenic C. difficile  Final     Scheduled Meds: . aspirin EC  81 mg Oral Daily  . atorvastatin  20 mg Oral Daily  . carvedilol  3.125 mg  Oral BID WC  . cefTRIAXone (ROCEPHIN)  IV  1 g Intravenous Q24H  . ezetimibe  10 mg Oral Daily  . feeding supplement (ENSURE ENLIVE)  237 mL Oral BID BM  . heparin  5,000 Units Subcutaneous 3 times per day  . levothyroxine  75 mcg Oral QAC breakfast  . metronidazole  500 mg Intravenous Q6H  . predniSONE  10 mg Oral QODAY  . sodium chloride  3 mL Intravenous Q12H  . torsemide  100 mg Oral Daily   Continuous Infusions:    Jalon Blackwelder, DO  [facility] Pager [phone]  If 7PM-7AM, please contact night-coverage [email or web address] Password [email or web address] 08/26/2015, 11:46 AM   LOS: 8 days

## 2015-08-26 NOTE — Progress Notes (Signed)
Cardiology to see as needed over the weekend please call with questions  Thompson Grayer MD, [facility] 08/26/2015 10:44 AM

## 2015-08-26 NOTE — Progress Notes (Signed)
[facility] ROUNDING NOTE   Subjective:   Interval History: no complaints noted  Objective:  Vital signs in last 24 hours:  Temp:  [98 F (36.7 C)-100.2 F (37.9 C)] 98 F (36.7 C) (10/01 1241) Pulse Rate:  [90-112] 112 (10/01 1241) Resp:  [16-18] 18 (10/01 1241) BP: (113-157)/(56-74) 113/66 mmHg (10/01 1241) SpO2:  [90 %-97 %] 97 % (10/01 1241) Weight:  [63 kg (138 lb 14.2 oz)] 63 kg (138 lb 14.2 oz) (09/30 2047)  Weight change: -0.231 kg (-8.2 oz) Filed Weights   08/24/15 0443 08/25/15 0454 08/25/15 2047  Weight: 63.322 kg (139 lb 9.6 oz) 63.231 kg (139 lb 6.4 oz) 63 kg (138 lb 14.2 oz)    Intake/Output: I/O last 3 completed shifts: In: 900 [P.O.:450; IV Piggyback:450] Out: 3295 [Urine:1400; Stool:1]   Intake/Output this shift:  Total I/O In: 240 [P.O.:240] Out: 0   CVS- RRR RS- CTA ABD- BS present soft non-distended EXT- no edema   Basic Metabolic Panel:  Recent Labs Lab 08/20/15 0300 08/21/15 0328 08/22/15 1452 08/23/15 0515 08/24/15 0524 08/25/15 0306  NA 138 140 137 137 140 139  K 3.4* 3.9 2.8* 3.1* 4.0 3.7  CL 107 109 105 103 107 106  CO2 24 25 21* 26 26 25   GLUCOSE 123* 110* 178* 101* 100* 121*  BUN 39* 39* 42* 44* 43* 44*  CREATININE 2.02* 1.84* 1.80* 1.91* 1.88* 2.09*  CALCIUM 7.3* 7.6* 7.5* 7.6* 7.5* 7.5*  MG 1.6*  --   --  2.3 2.0  --     Liver Function Tests:  Recent Labs Lab 08/22/15 1452 08/25/15 0306  AST 35 102*  ALT 15 79*  ALKPHOS 51 52  BILITOT 0.2* 0.4  PROT 3.8* 3.9*  ALBUMIN <1.0* <1.0*   No results for input(s): LIPASE, AMYLASE in the last 168 hours. No results for input(s): AMMONIA in the last 168 hours.  CBC:  Recent Labs Lab 08/20/15 0300 08/21/15 0328 08/22/15 0312 08/23/15 0515 08/25/15 0306  WBC 3.4* 3.7* 3.0* 3.6* 3.6*  NEUTROABS  --   --   --   --  3.2  HGB 7.8* 7.6* 9.7* 9.7* 9.4*  HCT 24.1* 24.0* 29.4* 29.7* 28.7*  MCV 95.3 94.9 90.5 89.7 92.0  PLT 224 245 224 242 231     Cardiac Enzymes: No results for input(s): CKTOTAL, CKMB, CKMBINDEX, TROPONINI in the last 168 hours.  BNP: Invalid input(s): POCBNP  CBG: No results for input(s): GLUCAP in the last 168 hours.  Microbiology: Results for orders placed or performed during the hospital encounter of 08/18/15  Culture, blood (routine x 2)     Status: None (Preliminary result)   Collection Time: 08/22/15  2:50 PM  Result Value Ref Range Status   Specimen Description BLOOD RIGHT HAND  Final   Special Requests BOTTLES DRAWN AEROBIC ONLY  2CC  Final   Culture NO GROWTH 3 DAYS  Final   Report Status PENDING  Incomplete  Urine culture     Status: None   Collection Time: 08/22/15  3:46 PM  Result Value Ref Range Status   Specimen Description URINE, CLEAN CATCH  Final   Special Requests Normal  Final   Culture   Final    40,000 COLONIES/ml GROUP B STREP(S.AGALACTIAE)ISOLATED TESTING AGAINST S. AGALACTIAE NOT ROUTINELY PERFORMED DUE TO PREDICTABILITY OF AMP/PEN/VAN SUSCEPTIBILITY. [place]    Report Status 08/24/2015 FINAL  Final   Organism ID, Bacteria KLUYVERA CRYOCRESCENS  Final  Susceptibility   Kluyvera cryocrescens - MIC*    AMPICILLIN 8 RESISTANT Resistant     CEFAZOLIN <=4 RESISTANT Resistant     CEFTRIAXONE <=1 SENSITIVE Sensitive     CIPROFLOXACIN <=0.25 SENSITIVE Sensitive     GENTAMICIN <=1 SENSITIVE Sensitive     IMIPENEM <=0.25 SENSITIVE Sensitive     NITROFURANTOIN <=16 SENSITIVE Sensitive     TRIMETH/SULFA <=20 SENSITIVE Sensitive     AMPICILLIN/SULBACTAM <=2 SENSITIVE Sensitive     * 10,000 COLONIES/mL KLUYVERA CRYOCRESCENS  C difficile quick scan w PCR reflex     Status: None   Collection Time: 08/24/15  8:04 PM  Result Value Ref Range Status   C Diff antigen NEGATIVE NEGATIVE Final   C Diff toxin NEGATIVE NEGATIVE Final   C Diff interpretation Negative for toxigenic C. difficile  Final    Coagulation Studies: No results for input(s):  LABPROT, INR in the last 72 hours.  Urinalysis: No results for input(s): COLORURINE, LABSPEC, PHURINE, GLUCOSEU, HGBUR, BILIRUBINUR, KETONESUR, PROTEINUR, UROBILINOGEN, NITRITE, LEUKOCYTESUR in the last 72 hours.  Invalid input(s): APPERANCEUR    Imaging: Ct Abdomen Pelvis Wo Contrast  08/24/2015   CLINICAL DATA:  Fever of unknown origin. Increasing abdominal girth, lower extremity edema and shortness of breath. History of CAD, stage III CKD and nephrotic syndrome.  EXAM: CT CHEST, ABDOMEN AND PELVIS WITHOUT CONTRAST  TECHNIQUE: Multidetector CT imaging of the chest, abdomen and pelvis was performed following the standard protocol without IV contrast.  COMPARISON:  08/22/2015 chest radiograph.  FINDINGS: CT CHEST FINDINGS  Mediastinum/Nodes: Normal heart size. Minimal pericardial fluid/thickening. There is atherosclerosis of the thoracic aorta, the great vessels of the mediastinum and the coronary arteries, including calcified atherosclerotic plaque in the left anterior descending and right coronary arteries. Great vessels are normal in course and caliber. Normal visualized thyroid. Normal esophagus. No axillary, mediastinal or gross hilar lymphadenopathy, noting limited sensitivity for the detection of hilar adenopathy on this noncontrast study.  Lungs/Pleura: No pneumothorax. Small layering bilateral pleural effusions. Associated passive atelectasis in the dependent lower lobes and dependent basilar right upper lobe. Mild ground-glass opacity and interlobular septal thickening in both upper lobes is most in keeping with mild pulmonary edema. There is a 1.9 x 0.7 cm focus of subpleural consolidation in the medial left upper lobe (series 205/image 16).  Musculoskeletal: No aggressive appearing focal osseous lesions. Moderate degenerative changes in the thoracic spine.  CT ABDOMEN AND PELVIS FINDINGS  Hepatobiliary: Normal liver with no liver mass. Normal gallbladder with no radiopaque cholelithiasis. No  biliary ductal dilatation.  Pancreas: Normal, with no mass or duct dilation.  Spleen: Normal size. No mass.  Adrenals/Urinary Tract: Normal adrenals. Normal kidneys with no nephrolithiasis, no hydronephrosis and no contour-deforming renal mass. Collapsed and grossly normal urinary bladder.  Stomach/Bowel: Small hiatal hernia. Otherwise collapsed and grossly normal stomach. Normal caliber small bowel with no small bowel wall thickening. Appendix is not discretely visualized. There is a masslike 2.4 x 2.1 cm focus of colonic wall thickening in the posterior distal transverse colon (series 201/image 56 and series 203/image 28). Within the adjacent gastro-colic ligament, there is ill-defined mixed density material measuring 4.8 x 3.0 cm in maximum axial dimensions (series 201/image 57), which contains internal foci of fat density and hyperdensity, and which is confluent with the focal transverse colonic wall thickening. No extraluminal gas. The hyperdense material in the gastrocolic ligament is of uncertain etiology and could represent calcification (such as the sequela of prior infectious or inflammatory episode) or  oral contrast (from prior leak as no oral contrast has reached the transverse colon on this scan). Otherwise normal large bowel.  Vascular/Lymphatic: Atherosclerotic nonaneurysmal abdominal aorta. No pathologically enlarged lymph nodes in the abdomen or pelvis.  Reproductive: Grossly normal uterus.  No adnexal mass.  Other: No pneumoperitoneum.  Small volume ascites.  Musculoskeletal: Marked degenerative changes in the lumbar spine. Mild anasarca in the dependent subcutaneous soft tissues of the lower chest, flanks and pelvis.  IMPRESSION: 1. Small 1.9 x 0.7 cm focus of subpleural consolidation in the medial left upper lobe, indeterminate, possibly a small focus of infection. Recommend attention to this focus on a follow-up post treatment chest CT in 2-3 months. 2. Evidence of third-spacing, including small  bilateral pleural effusions, small volume ascites, mild pulmonary edema and mild anasarca. 3. Focal colonic wall thickening in the posterior distal transverse colon, cannot exclude colonic neoplasm. Recommend correlation with colonoscopy. 4. Nonspecific mixed density material in the gastro-colic ligament adjacent to the focus of colonic wall thickening in the transverse colon. Favor the sequela of prior infection or prior perforation. No free air on this study to suggest recent perforation. 5.  Atherosclerosis, including two-vessel coronary artery disease. 6. Small hiatal hernia.   Electronically Signed   By: Ilona Sorrel M.D.   On: 08/24/2015 19:12   Ct Chest Wo Contrast  08/24/2015   CLINICAL DATA:  Fever of unknown origin. Increasing abdominal girth, lower extremity edema and shortness of breath. History of CAD, stage III CKD and nephrotic syndrome.  EXAM: CT CHEST, ABDOMEN AND PELVIS WITHOUT CONTRAST  TECHNIQUE: Multidetector CT imaging of the chest, abdomen and pelvis was performed following the standard protocol without IV contrast.  COMPARISON:  08/22/2015 chest radiograph.  FINDINGS: CT CHEST FINDINGS  Mediastinum/Nodes: Normal heart size. Minimal pericardial fluid/thickening. There is atherosclerosis of the thoracic aorta, the great vessels of the mediastinum and the coronary arteries, including calcified atherosclerotic plaque in the left anterior descending and right coronary arteries. Great vessels are normal in course and caliber. Normal visualized thyroid. Normal esophagus. No axillary, mediastinal or gross hilar lymphadenopathy, noting limited sensitivity for the detection of hilar adenopathy on this noncontrast study.  Lungs/Pleura: No pneumothorax. Small layering bilateral pleural effusions. Associated passive atelectasis in the dependent lower lobes and dependent basilar right upper lobe. Mild ground-glass opacity and interlobular septal thickening in both upper lobes is most in keeping with  mild pulmonary edema. There is a 1.9 x 0.7 cm focus of subpleural consolidation in the medial left upper lobe (series 205/image 16).  Musculoskeletal: No aggressive appearing focal osseous lesions. Moderate degenerative changes in the thoracic spine.  CT ABDOMEN AND PELVIS FINDINGS  Hepatobiliary: Normal liver with no liver mass. Normal gallbladder with no radiopaque cholelithiasis. No biliary ductal dilatation.  Pancreas: Normal, with no mass or duct dilation.  Spleen: Normal size. No mass.  Adrenals/Urinary Tract: Normal adrenals. Normal kidneys with no nephrolithiasis, no hydronephrosis and no contour-deforming renal mass. Collapsed and grossly normal urinary bladder.  Stomach/Bowel: Small hiatal hernia. Otherwise collapsed and grossly normal stomach. Normal caliber small bowel with no small bowel wall thickening. Appendix is not discretely visualized. There is a masslike 2.4 x 2.1 cm focus of colonic wall thickening in the posterior distal transverse colon (series 201/image 56 and series 203/image 28). Within the adjacent gastro-colic ligament, there is ill-defined mixed density material measuring 4.8 x 3.0 cm in maximum axial dimensions (series 201/image 57), which contains internal foci of fat density and hyperdensity, and which is confluent with  the focal transverse colonic wall thickening. No extraluminal gas. The hyperdense material in the gastrocolic ligament is of uncertain etiology and could represent calcification (such as the sequela of prior infectious or inflammatory episode) or oral contrast (from prior leak as no oral contrast has reached the transverse colon on this scan). Otherwise normal large bowel.  Vascular/Lymphatic: Atherosclerotic nonaneurysmal abdominal aorta. No pathologically enlarged lymph nodes in the abdomen or pelvis.  Reproductive: Grossly normal uterus.  No adnexal mass.  Other: No pneumoperitoneum.  Small volume ascites.  Musculoskeletal: Marked degenerative changes in the lumbar  spine. Mild anasarca in the dependent subcutaneous soft tissues of the lower chest, flanks and pelvis.  IMPRESSION: 1. Small 1.9 x 0.7 cm focus of subpleural consolidation in the medial left upper lobe, indeterminate, possibly a small focus of infection. Recommend attention to this focus on a follow-up post treatment chest CT in 2-3 months. 2. Evidence of third-spacing, including small bilateral pleural effusions, small volume ascites, mild pulmonary edema and mild anasarca. 3. Focal colonic wall thickening in the posterior distal transverse colon, cannot exclude colonic neoplasm. Recommend correlation with colonoscopy. 4. Nonspecific mixed density material in the gastro-colic ligament adjacent to the focus of colonic wall thickening in the transverse colon. Favor the sequela of prior infection or prior perforation. No free air on this study to suggest recent perforation. 5.  Atherosclerosis, including two-vessel coronary artery disease. 6. Small hiatal hernia.   Electronically Signed   By: Ilona Sorrel M.D.   On: 08/24/2015 19:12   Dg Chest Port 1 View  08/26/2015   CLINICAL DATA:  Shortness of Breath  EXAM: PORTABLE CHEST 1 VIEW  COMPARISON:  Chest radiograph August 22, 2015 and chest CT August 24, 2015  FINDINGS: There is no edema or consolidation. The heart size and pulmonary vascularity are normal. No adenopathy. No bone lesions.  IMPRESSION: No edema or consolidation.   Electronically Signed   By: Lowella Grip III M.D.   On: 08/26/2015 12:21     Medications:     . aspirin EC  81 mg Oral Daily  . atorvastatin  20 mg Oral Daily  . carvedilol  3.125 mg Oral BID WC  . cefTRIAXone (ROCEPHIN)  IV  1 g Intravenous Q24H  . ezetimibe  10 mg Oral Daily  . feeding supplement (ENSURE ENLIVE)  237 mL Oral BID BM  . heparin  5,000 Units Subcutaneous 3 times per day  . levothyroxine  75 mcg Oral QAC breakfast  . metronidazole  500 mg Intravenous Q6H  . predniSONE  10 mg Oral QODAY  . sodium  chloride  3 mL Intravenous Q12H   sodium chloride, acetaminophen, ondansetron (ZOFRAN) IV, sodium chloride  Assessment/ Plan:  1. CKD Stage IV/Membranous GN/Nephrotic Syndrome: Edema improved. She made 9102ml of urine over last 24 hours. Cr 2.09 from 1.88 yesterday (baseline around 1.7). On home PO torsemide. On Prednisone 10 mg daily. Cytoxan discontinued. No emergent HD needs. Will need to follow up with Dr. Arty Baumgartner.  2. UTI with sepsis: Fever to 100.2 max . Blood culture no growth and urine culture with group b strep and kluyvera cryocrescens. CXR with left base atelectasis. Cytoxan discontinued. Abx per primary. 2. HTN: On Coreg 3.125 mg BID.  3. Anemia: Hgb stable. Received 1u pRBC 9/26. Iron low normal at 29. Saturation unable to be calculated. May need increase in EPO.    LOS: 8 [place] W @TODAY @1 :28 PM

## 2015-08-27 DIAGNOSIS — A419 Sepsis, unspecified organism: Secondary | ICD-10-CM

## 2015-08-27 DIAGNOSIS — D638 Anemia in other chronic diseases classified elsewhere: Secondary | ICD-10-CM

## 2015-08-27 LAB — BASIC METABOLIC PANEL
ANION GAP: 8 (ref 5–15)
BUN: 45 mg/dL — ABNORMAL HIGH (ref 6–20)
CHLORIDE: 108 mmol/L (ref 101–111)
CO2: 22 mmol/L (ref 22–32)
Calcium: 7.3 mg/dL — ABNORMAL LOW (ref 8.9–10.3)
Creatinine, Ser: 1.95 mg/dL — ABNORMAL HIGH (ref 0.44–1.00)
GFR calc non Af Amer: 22 mL/min — ABNORMAL LOW (ref >60)
GFR, EST AFRICAN AMERICAN: 26 mL/min — AB (ref >60)
Glucose, Bld: 123 mg/dL — ABNORMAL HIGH (ref 65–99)
POTASSIUM: 3.3 mmol/L — AB (ref 3.5–5.1)
Sodium: 138 mmol/L (ref 135–145)

## 2015-08-27 LAB — CULTURE, BLOOD (ROUTINE X 2): Culture: NO GROWTH

## 2015-08-27 LAB — CBC
HCT: 27.6 % — ABNORMAL LOW (ref 36.0–46.0)
HEMOGLOBIN: 8.9 g/dL — AB (ref 12.0–15.0)
MCH: 29.6 pg (ref 26.0–34.0)
MCHC: 32.2 g/dL (ref 30.0–36.0)
MCV: 91.7 fL (ref 78.0–100.0)
Platelets: 208 10*3/uL (ref 150–400)
RBC: 3.01 MIL/uL — AB (ref 3.87–5.11)
RDW: 17.3 % — ABNORMAL HIGH (ref 11.5–15.5)
WBC: 3.6 10*3/uL — AB (ref 4.0–10.5)

## 2015-08-27 LAB — CEA: CEA: 11.1 ng/mL — ABNORMAL HIGH (ref 0.0–4.7)

## 2015-08-27 MED ORDER — POTASSIUM CHLORIDE CRYS ER 20 MEQ PO TBCR
20.0000 meq | EXTENDED_RELEASE_TABLET | Freq: Once | ORAL | Status: AC
  Administered 2015-08-27: 20 meq via ORAL
  Filled 2015-08-27: qty 1

## 2015-08-27 MED ORDER — ASPIRIN 81 MG PO TBEC: 81.0000 mg | DELAYED_RELEASE_TABLET | Freq: Every day | ORAL | Status: AC

## 2015-08-27 MED ORDER — TORSEMIDE 20 MG PO TABS
100.0000 mg | ORAL_TABLET | Freq: Every day | ORAL | Status: DC
  Administered 2015-08-27 – 2015-08-28 (×2): 100 mg via ORAL
  Filled 2015-08-27 (×2): qty 5

## 2015-08-27 MED ORDER — METRONIDAZOLE 500 MG PO TABS
500.0000 mg | ORAL_TABLET | Freq: Three times a day (TID) | ORAL | Status: DC
  Administered 2015-08-27 – 2015-08-28 (×4): 500 mg via ORAL
  Filled 2015-08-27 (×4): qty 1

## 2015-08-27 MED ORDER — ENSURE ENLIVE PO LIQD: 237.0000 mL | Freq: Two times a day (BID) | ORAL | Status: DC

## 2015-08-27 MED ORDER — METOLAZONE 2.5 MG PO TABS
2.5000 mg | ORAL_TABLET | Freq: Once | ORAL | Status: AC
  Administered 2015-08-27: 2.5 mg via ORAL
  Filled 2015-08-27: qty 1

## 2015-08-27 MED ORDER — METOLAZONE 2.5 MG PO TABS
2.5000 mg | ORAL_TABLET | ORAL | Status: DC
  Administered 2015-08-28: 2.5 mg via ORAL
  Filled 2015-08-27: qty 1

## 2015-08-27 NOTE — Progress Notes (Signed)
PROGRESS NOTE  THECLA FORGIONE GQQ:[id number] DOB: 1928/04/07 DOA: 08/18/2015 PCP: Precious Reel, MD  Brief history A 81 year old female with a history of CAD with MI in 1990 with Single vessel disease with a severe stenosis in the RCA which had recannalized since prior study in 1990, hyperlipidemia, hypothyroidism, CKD stage III, nephrotic syndrome with biopsy-proven membranous nephropathy presented on 08/18/2015 with increasing abdominal girth, lower extremity edema, and shortness of breath. The patient was up 13 pounds above her dry weight at 146 pounds on the day of admission. The patient was started on intravenous furosemide with good clinical response. Nephrology was consulted secondary to the patient's CKD and nephrotic syndrome. The patient had been previously on Cytoxan and prednisone which she did not start taking until July 2016. She was recently discharged in August 2016 secondary to syncope. At that time, her diuretics and ARB were held. She was hydrated and improved clinically. At the time of discharge, the patient's torsemide was started at a lower dose, 80 mg every afternoon, 100 mg every morning Unfortunately, he began developing fevers on 08/19/2015 up to 101.3F. Extensive workup was undertaken. Blood cultures remained negative. The patient was started on empiric antibiotics. The patient was initially thought to have a UTI, but the patient continued to have fevers despite broad-spectrum intravenous antibiotics. Subsequently, CT of the abdomen and pelvis was obtained. Assessment/Plan: Fever/leukopenia/lactic acidosis-->Sepsis -due to underlying colonic process -Unfortunately, the patient has been on Cytoxan which may be blunting her inflammatory response -fever improved with start of ceftriaxone and metronidazole -Cytoxan was discontinued after her 08/22/2015 dose -Lactic acid 2.6 --> 2.0  -Procalcitonin 0.38 -->0.44  -CXR without consolidation -CT chest--small left  upper lobe subpleural consolidation; bilateral mild groundglass opacities more consistent with edema -CT abd/pelvis--masslike colon wall thickening in the transverse colon, ill-defined mixed density material adjacent to the transverse colon questioning leak versus abscess versus inflammatory response from prior infection -Concerned that CT abdomen pelvis findings may be contributing to the patient's persistent fever -appreciate GI-->tx with abx and repeat CT in 2-3 weeks -general surgery for evaluation--pt refused surgery of any kind -c diff screen--negative -no meningismus on exam -lower extremity duplex r/o DVT -CBC with diff and LFTs in am Colonic wall thickening with adjacent phlegmon -Patient was seen by gastroenterology -She does not want any surgery  -continue empiric treatment with antibiotics -Switch metronidazole to po, continue CTX -plan to send pt home with 11 additional days of abx -discussed with GI, Dr. Theda Belfast CT abd pelvis in 2-3 week after finished with abx before deciding on colonoscopy UTI  -d/c zosyn -Continue ceftriaxone -inflammatory response, pyuria may be blunted by pt on cytoxan Acute on chronic renal failure (CKD 4)/ nephrotic syndrome/membranous glomerulonephritis -per nephrology -continue prednisone  -Cyclophosphamide was discontinued after 08/22/2015 dose -will likely need to tolerate worse renal function for better respiratory status  Acute on chronic diastolic CHF -Baseline weight at approximately 132 -Continue carvedilol -NEG 5 pounds since admission -08/26/2015 Increasingly dyspneic with oxygen desaturation -08/26/15--order CXR--interstitial prominence -08/26/15--restart IV lasix 80 mg bid -08/27/2015---> clinically improved--> transition back to home torsemide regimen Acute respiratory failure with hypoxia -placed back on oxygen 08/26/15 -08/26/15--stable presently on 2L without increased WOB -08/27/2015--clinically improved--stable on  room air Leukopenia  -Secondary to Cytoxan--last dose given 08/22/2015 Elevated troponin -Secondary to demand ischemia in the setting of decompensated CHF and worsening renal function Anemia CKD  -Received 1 unit PRBC 08/21/2015  Hypothyroidism  -TSH 1.855  -Continue  Synthroid  Hyperlipidemia  -Continue statin  CAD with history of MI -Continue aspirin Hypokalemia -Repleted  Disposition Plan: Home 08/28/15 Communication--husband updated at bedside   Procedures/Studies: Ct Abdomen Pelvis Wo Contrast  08/24/2015   CLINICAL DATA:  Fever of unknown origin. Increasing abdominal girth, lower extremity edema and shortness of breath. History of CAD, stage III CKD and nephrotic syndrome.  EXAM: CT CHEST, ABDOMEN AND PELVIS WITHOUT CONTRAST  TECHNIQUE: Multidetector CT imaging of the chest, abdomen and pelvis was performed following the standard protocol without IV contrast.  COMPARISON:  08/22/2015 chest radiograph.  FINDINGS: CT CHEST FINDINGS  Mediastinum/Nodes: Normal heart size. Minimal pericardial fluid/thickening. There is atherosclerosis of the thoracic aorta, the great vessels of the mediastinum and the coronary arteries, including calcified atherosclerotic plaque in the left anterior descending and right coronary arteries. Great vessels are normal in course and caliber. Normal visualized thyroid. Normal esophagus. No axillary, mediastinal or gross hilar lymphadenopathy, noting limited sensitivity for the detection of hilar adenopathy on this noncontrast study.  Lungs/Pleura: No pneumothorax. Small layering bilateral pleural effusions. Associated passive atelectasis in the dependent lower lobes and dependent basilar right upper lobe. Mild ground-glass opacity and interlobular septal thickening in both upper lobes is most in keeping with mild pulmonary edema. There is a 1.9 x 0.7 cm focus of subpleural consolidation in the medial left upper lobe (series 205/image 16).  Musculoskeletal:  No aggressive appearing focal osseous lesions. Moderate degenerative changes in the thoracic spine.  CT ABDOMEN AND PELVIS FINDINGS  Hepatobiliary: Normal liver with no liver mass. Normal gallbladder with no radiopaque cholelithiasis. No biliary ductal dilatation.  Pancreas: Normal, with no mass or duct dilation.  Spleen: Normal size. No mass.  Adrenals/Urinary Tract: Normal adrenals. Normal kidneys with no nephrolithiasis, no hydronephrosis and no contour-deforming renal mass. Collapsed and grossly normal urinary bladder.  Stomach/Bowel: Small hiatal hernia. Otherwise collapsed and grossly normal stomach. Normal caliber small bowel with no small bowel wall thickening. Appendix is not discretely visualized. There is a masslike 2.4 x 2.1 cm focus of colonic wall thickening in the posterior distal transverse colon (series 201/image 56 and series 203/image 28). Within the adjacent gastro-colic ligament, there is ill-defined mixed density material measuring 4.8 x 3.0 cm in maximum axial dimensions (series 201/image 57), which contains internal foci of fat density and hyperdensity, and which is confluent with the focal transverse colonic wall thickening. No extraluminal gas. The hyperdense material in the gastrocolic ligament is of uncertain etiology and could represent calcification (such as the sequela of prior infectious or inflammatory episode) or oral contrast (from prior leak as no oral contrast has reached the transverse colon on this scan). Otherwise normal large bowel.  Vascular/Lymphatic: Atherosclerotic nonaneurysmal abdominal aorta. No pathologically enlarged lymph nodes in the abdomen or pelvis.  Reproductive: Grossly normal uterus.  No adnexal mass.  Other: No pneumoperitoneum.  Small volume ascites.  Musculoskeletal: Marked degenerative changes in the lumbar spine. Mild anasarca in the dependent subcutaneous soft tissues of the lower chest, flanks and pelvis.  IMPRESSION: 1. Small 1.9 x 0.7 cm focus of  subpleural consolidation in the medial left upper lobe, indeterminate, possibly a small focus of infection. Recommend attention to this focus on a follow-up post treatment chest CT in 2-3 months. 2. Evidence of third-spacing, including small bilateral pleural effusions, small volume ascites, mild pulmonary edema and mild anasarca. 3. Focal colonic wall thickening in the posterior distal transverse colon, cannot exclude colonic neoplasm. Recommend correlation with colonoscopy. 4. Nonspecific mixed density material in  the gastro-colic ligament adjacent to the focus of colonic wall thickening in the transverse colon. Favor the sequela of prior infection or prior perforation. No free air on this study to suggest recent perforation. 5.  Atherosclerosis, including two-vessel coronary artery disease. 6. Small hiatal hernia.   Electronically Signed   By: Ilona Sorrel M.D.   On: 08/24/2015 19:12   Dg Chest 2 View  08/22/2015   CLINICAL DATA:  Fever with sweats for 1 day  EXAM: CHEST  2 VIEW  COMPARISON:  August 18, 2015  FINDINGS: There is mild atelectasis in the left base. Lungs elsewhere clear. Heart size and pulmonary vascularity are normal. No adenopathy. There is degenerative change in the thoracic spine. There is calcification in both carotid arteries.  IMPRESSION: Left base atelectasis. No frank edema or consolidation. Calcification in each carotid artery.   Electronically Signed   By: Lowella Grip III M.D.   On: 08/22/2015 18:36   Dg Chest 2 View  08/18/2015   CLINICAL DATA:  Shortness of breath.  Symptoms today.  EXAM: CHEST  2 VIEW  COMPARISON:  07/17/2015  FINDINGS: Linear atelectasis in the lingula. Right lung is clear. Heart is upper limits normal in size. No effusions. No acute bony abnormality.  IMPRESSION: Lingular atelectasis.   Electronically Signed   By: Rolm Baptise M.D.   On: 08/18/2015 18:08   Ct Chest Wo Contrast  08/24/2015   CLINICAL DATA:  Fever of unknown origin. Increasing  abdominal girth, lower extremity edema and shortness of breath. History of CAD, stage III CKD and nephrotic syndrome.  EXAM: CT CHEST, ABDOMEN AND PELVIS WITHOUT CONTRAST  TECHNIQUE: Multidetector CT imaging of the chest, abdomen and pelvis was performed following the standard protocol without IV contrast.  COMPARISON:  08/22/2015 chest radiograph.  FINDINGS: CT CHEST FINDINGS  Mediastinum/Nodes: Normal heart size. Minimal pericardial fluid/thickening. There is atherosclerosis of the thoracic aorta, the great vessels of the mediastinum and the coronary arteries, including calcified atherosclerotic plaque in the left anterior descending and right coronary arteries. Great vessels are normal in course and caliber. Normal visualized thyroid. Normal esophagus. No axillary, mediastinal or gross hilar lymphadenopathy, noting limited sensitivity for the detection of hilar adenopathy on this noncontrast study.  Lungs/Pleura: No pneumothorax. Small layering bilateral pleural effusions. Associated passive atelectasis in the dependent lower lobes and dependent basilar right upper lobe. Mild ground-glass opacity and interlobular septal thickening in both upper lobes is most in keeping with mild pulmonary edema. There is a 1.9 x 0.7 cm focus of subpleural consolidation in the medial left upper lobe (series 205/image 16).  Musculoskeletal: No aggressive appearing focal osseous lesions. Moderate degenerative changes in the thoracic spine.  CT ABDOMEN AND PELVIS FINDINGS  Hepatobiliary: Normal liver with no liver mass. Normal gallbladder with no radiopaque cholelithiasis. No biliary ductal dilatation.  Pancreas: Normal, with no mass or duct dilation.  Spleen: Normal size. No mass.  Adrenals/Urinary Tract: Normal adrenals. Normal kidneys with no nephrolithiasis, no hydronephrosis and no contour-deforming renal mass. Collapsed and grossly normal urinary bladder.  Stomach/Bowel: Small hiatal hernia. Otherwise collapsed and grossly  normal stomach. Normal caliber small bowel with no small bowel wall thickening. Appendix is not discretely visualized. There is a masslike 2.4 x 2.1 cm focus of colonic wall thickening in the posterior distal transverse colon (series 201/image 56 and series 203/image 28). Within the adjacent gastro-colic ligament, there is ill-defined mixed density material measuring 4.8 x 3.0 cm in maximum axial dimensions (series 201/image 57), which contains  internal foci of fat density and hyperdensity, and which is confluent with the focal transverse colonic wall thickening. No extraluminal gas. The hyperdense material in the gastrocolic ligament is of uncertain etiology and could represent calcification (such as the sequela of prior infectious or inflammatory episode) or oral contrast (from prior leak as no oral contrast has reached the transverse colon on this scan). Otherwise normal large bowel.  Vascular/Lymphatic: Atherosclerotic nonaneurysmal abdominal aorta. No pathologically enlarged lymph nodes in the abdomen or pelvis.  Reproductive: Grossly normal uterus.  No adnexal mass.  Other: No pneumoperitoneum.  Small volume ascites.  Musculoskeletal: Marked degenerative changes in the lumbar spine. Mild anasarca in the dependent subcutaneous soft tissues of the lower chest, flanks and pelvis.  IMPRESSION: 1. Small 1.9 x 0.7 cm focus of subpleural consolidation in the medial left upper lobe, indeterminate, possibly a small focus of infection. Recommend attention to this focus on a follow-up post treatment chest CT in 2-3 months. 2. Evidence of third-spacing, including small bilateral pleural effusions, small volume ascites, mild pulmonary edema and mild anasarca. 3. Focal colonic wall thickening in the posterior distal transverse colon, cannot exclude colonic neoplasm. Recommend correlation with colonoscopy. 4. Nonspecific mixed density material in the gastro-colic ligament adjacent to the focus of colonic wall thickening in  the transverse colon. Favor the sequela of prior infection or prior perforation. No free air on this study to suggest recent perforation. 5.  Atherosclerosis, including two-vessel coronary artery disease. 6. Small hiatal hernia.   Electronically Signed   By: Ilona Sorrel M.D.   On: 08/24/2015 19:12   Dg Chest Port 1 View  08/26/2015   CLINICAL DATA:  Shortness of Breath  EXAM: PORTABLE CHEST 1 VIEW  COMPARISON:  Chest radiograph August 22, 2015 and chest CT August 24, 2015  FINDINGS: There is no edema or consolidation. The heart size and pulmonary vascularity are normal. No adenopathy. No bone lesions.  IMPRESSION: No edema or consolidation.   Electronically Signed   By: Lowella Grip III M.D.   On: 08/26/2015 12:21         Subjective: Patient denies fevers, chills, headache, chest pain, dyspnea, nausea, vomiting, diarrhea, abdominal pain, dysuria, hematuria. No hematochezia or melena.   Objective: Filed Vitals:   08/26/15 1751 08/26/15 2046 08/27/15 0433 08/27/15 1400  BP: 120/53 128/56 153/70 124/58  Pulse: 83 80 88 74  Temp:  97.9 F (36.6 C) 98.7 F (37.1 C) 98 F (36.7 C)  TempSrc:  Oral Oral Oral  Resp:  18 19 18   Height:      Weight:   63.4 kg (139 lb 12.4 oz)   SpO2:  93% 96% 97%    Intake/Output Summary (Last 24 hours) at 08/27/15 1721 Last data filed at 08/27/15 1300  Gross per 24 hour  Intake    760 ml  Output   1076 ml  Net   -316 ml   Weight change: 0.4 kg (14.1 oz) Exam:   General:  Pt is alert, follows commands appropriately, not in acute distress  HEENT: No icterus, No thrush, No neck mass, [place]/AT  Cardiovascular: RRR, S1/S2, no rubs, no gallops  Respiratory: CTA bilaterally, no wheezing, no crackles, no rhonchi  Abdomen: Soft/+BS, non tender, non distended, no guarding; no hepatosplenomegaly  Extremities: trace LE edema, No lymphangitis, No petechiae, No rashes, no synovitis; no cyanosis or clubbing  Data Reviewed: Basic Metabolic  Panel:  Recent Labs Lab 08/23/15 0515 08/24/15 0524 08/25/15 0306 08/26/15 1345 08/27/15 0255  NA  137 140 139 137 138  K 3.1* 4.0 3.7 3.3* 3.3*  CL 103 107 106 105 108  CO2 26 26 25 23 22   GLUCOSE 101* 100* 121* 166* 123*  BUN 44* 43* 44* 45* 45*  CREATININE 1.91* 1.88* 2.09* 1.90* 1.95*  CALCIUM 7.6* 7.5* 7.5* 7.4* 7.3*  MG 2.3 2.0  --   --   --    Liver Function Tests:  Recent Labs Lab 08/22/15 1452 08/25/15 0306  AST 35 102*  ALT 15 79*  ALKPHOS 51 52  BILITOT 0.2* 0.4  PROT 3.8* 3.9*  ALBUMIN <1.0* <1.0*   No results for input(s): LIPASE, AMYLASE in the last 168 hours. No results for input(s): AMMONIA in the last 168 hours. CBC:  Recent Labs Lab 08/21/15 0328 08/22/15 0312 08/23/15 0515 08/25/15 0306 08/27/15 0255  WBC 3.7* 3.0* 3.6* 3.6* 3.6*  NEUTROABS  --   --   --  3.2  --   HGB 7.6* 9.7* 9.7* 9.4* 8.9*  HCT 24.0* 29.4* 29.7* 28.7* 27.6*  MCV 94.9 90.5 89.7 92.0 91.7  PLT 245 224 242 231 208   Cardiac Enzymes: No results for input(s): CKTOTAL, CKMB, CKMBINDEX, TROPONINI in the last 168 hours. BNP: Invalid input(s): POCBNP CBG: No results for input(s): GLUCAP in the last 168 hours.  Recent Results (from the past 240 hour(s))  Culture, blood (routine x 2)     Status: None (Preliminary result)   Collection Time: 08/22/15  2:50 PM  Result Value Ref Range Status   Specimen Description BLOOD RIGHT HAND  Final   Special Requests BOTTLES DRAWN AEROBIC ONLY  2CC  Final   Culture NO GROWTH 4 DAYS  Final   Report Status PENDING  Incomplete  Urine culture     Status: None   Collection Time: 08/22/15  3:46 PM  Result Value Ref Range Status   Specimen Description URINE, CLEAN CATCH  Final   Special Requests Normal  Final   Culture   Final    40,000 COLONIES/ml GROUP B STREP(S.AGALACTIAE)ISOLATED TESTING AGAINST S. AGALACTIAE NOT ROUTINELY PERFORMED DUE TO PREDICTABILITY OF AMP/PEN/VAN SUSCEPTIBILITY. 10,000 COLONIES/mL [place]     Report Status 08/24/2015 FINAL  Final   Organism ID, Bacteria KLUYVERA CRYOCRESCENS  Final      Susceptibility   Kluyvera cryocrescens - MIC*    AMPICILLIN 8 RESISTANT Resistant     CEFAZOLIN <=4 RESISTANT Resistant     CEFTRIAXONE <=1 SENSITIVE Sensitive     CIPROFLOXACIN <=0.25 SENSITIVE Sensitive     GENTAMICIN <=1 SENSITIVE Sensitive     IMIPENEM <=0.25 SENSITIVE Sensitive     NITROFURANTOIN <=16 SENSITIVE Sensitive     TRIMETH/SULFA <=20 SENSITIVE Sensitive     AMPICILLIN/SULBACTAM <=2 SENSITIVE Sensitive     * 10,000 COLONIES/mL KLUYVERA CRYOCRESCENS  C difficile quick scan w PCR reflex     Status: None   Collection Time: 08/24/15  8:04 PM  Result Value Ref Range Status   C Diff antigen NEGATIVE NEGATIVE Final   C Diff toxin NEGATIVE NEGATIVE Final   C Diff interpretation Negative for toxigenic C. difficile  Final     Scheduled Meds: . aspirin EC  81 mg Oral Daily  . atorvastatin  20 mg Oral Daily  . carvedilol  3.125 mg Oral BID WC  . cefTRIAXone (ROCEPHIN)  IV  1 g Intravenous Q24H  . ezetimibe  10 mg Oral Daily  . feeding supplement (ENSURE ENLIVE)  237 mL Oral BID BM  . heparin  5,000 Units Subcutaneous 3 times per day  . levothyroxine  75 mcg Oral QAC breakfast  . [START ON 08/28/2015] metolazone  2.5 mg Oral Q M,W,F  . metroNIDAZOLE  500 mg Oral 3 times per day  . predniSONE  10 mg Oral QODAY  . sodium chloride  3 mL Intravenous Q12H  . torsemide  100 mg Oral Q breakfast   Continuous Infusions:    Sadeel Fiddler, DO  [facility] Pager [phone]  If 7PM-7AM, please contact night-coverage [email or web address] Password [email or web address] 08/27/2015, 5:21 PM   LOS: 9 days

## 2015-08-27 NOTE — Discharge Summary (Signed)
Physician Discharge Summary  Erika [name] DGU:[id number] DOB: Jan 13, 1928 DOA: 08/18/2015  PCP: Precious Reel, MD  Admit date: 08/18/2015 Discharge date: 08/28/2015 Recommendations for Outpatient Follow-up:  1. Pt will need to follow up with PCP in 2 weeks post discharge 2. Please obtain BMP in one week  Discharge Diagnoses:  Fever/leukopenia/lactic acidosis-->Sepsis -due to underlying colonic process -Unfortunately, the patient has been on Cytoxan which may be blunting her inflammatory response -fever improved with start of ceftriaxone and metronidazole -Cytoxan was discontinued after her 08/22/2015 dose -Lactic acid 2.6 --> 2.0  -Procalcitonin 0.38 -->0.44  -CXR without consolidation -CT chest--small left upper lobe subpleural consolidation; bilateral mild groundglass opacities more consistent with edema -CT abd/pelvis--masslike colon wall thickening in the transverse colon, ill-defined mixed density material adjacent to the transverse colon questioning leak versus abscess versus inflammatory response from prior infection -Concerned that CT abdomen pelvis findings may be contributing to the patient's persistent fever -appreciate GI-->tx with abx and repeat CT in 2-3 weeks -general surgery for evaluation--pt refused surgery of any kind -c diff screen--negative -no meningismus on exam -lower extremity duplex r/o DVT -CBC with diff and LFTs in am Colonic wall thickening with adjacent phlegmon -Patient was seen by gastroenterology and general surgery -She does not want any surgery  -continue empiric treatment with antibiotics -Switch metronidazole to po, continue CTX -plan to send pt home with 11 additional days of abx -discussed with GI, Dr. Theda Belfast CT abd pelvis in 2-3 week after finished with abx before deciding on colonoscopy -Patient will go home with ciprofloxacin and Flagyl for 11 more days which will complete 14 days therapy. UTI  -d/c zosyn -Continue  ceftriaxone -finished 6 days of abx -inflammatory response, pyuria may be blunted by pt on cytoxan Acute on chronic renal failure (CKD 4)/ nephrotic syndrome/membranous glomerulonephritis -per nephrology -continue prednisone  -Cyclophosphamide was discontinued after 08/22/2015 dose -will likely need to tolerate worse renal function for better respiratory status  Acute on chronic diastolic CHF -Baseline weight at approximately 132 -Continue carvedilol -NEG 5 pounds since admission; neg 4.2 L -08/26/2015 Increasingly dyspneic with oxygen desaturation -08/26/15--order CXR--interstitial prominence -08/26/15--restart IV lasix 80 mg bid -08/27/2015---> clinically improved--> transition back to home torsemide regimen -Patient will go home with torsemide 100 mg daily with metolazone 2.5 mg on M-W-F -Home with carvedilol 3.125 mg twice a day Acute respiratory failure with hypoxia -placed back on oxygen 08/26/15 -08/26/15--stable presently on 2L without increased WOB -08/27/2015--clinically improved--stable on room air Leukopenia  -Secondary to Cytoxan--last dose given 08/22/2015 Elevated troponin -Secondary to demand ischemia in the setting of decompensated CHF and worsening renal function Anemia CKD  -Received 1 unit PRBC 08/21/2015  Hypothyroidism  -TSH 1.855  -Continue Synthroid  Hyperlipidemia  -Continue statin  CAD with history of MI -Continue aspirin Hypokalemia -Repleted  Discharge Condition: stable  Disposition:  Follow-up Information    Follow up with Precious Reel, MD In 2 weeks.   Specialty:  Internal Medicine   Contact information:   [place] [address] [phone]       Follow up with Ezzard Standing, MD In 1 week.   Specialty:  Cardiology   Contact information:   [address] [place] [address] [phone]     home  Diet:heart healthy Wt Readings from Last 3 Encounters:  08/28/15 62.687 kg (138 lb 3.2  oz)  07/17/15 63.192 kg (139 lb 5 oz)  03/22/15 70.988 kg (156 lb 8 oz)    History of present  illness:  A 81 year old female with a history of CAD with MI in 1990 with Single vessel disease with a severe stenosis in the RCA which had recannalized since prior study in 1990, hyperlipidemia, hypothyroidism, CKD stage III, nephrotic syndrome with biopsy-proven membranous nephropathy presented on 08/18/2015 with increasing abdominal girth, lower extremity edema, and shortness of breath. The patient was up 13 pounds above her dry weight at 146 pounds on the day of admission. The patient was started on intravenous furosemide with good clinical response. Nephrology was consulted secondary to the patient's CKD and nephrotic syndrome. The patient had been previously on Cytoxan and prednisone which she did not start taking until July 2016. She was recently discharged in August 2016 secondary to syncope. At that time, her diuretics and ARB were held. She was hydrated and improved clinically. At the time of discharge, the patient's torsemide was started at a lower dose, 80 mg every afternoon, 100 mg every morning Unfortunately, he began developing fevers on 08/19/2015 up to 101.39F. Extensive workup was undertaken. Blood cultures remained negative. The patient was started on empiric antibiotics. The patient was initially thought to have a UTI, but the patient continued to have fevers despite broad-spectrum intravenous antibiotics. Subsequently, CT of the abdomen and pelvis was obtained.  Consultants: Cardiology [facility] General Surgery--[name]  Discharge Exam: Filed Vitals:   08/28/15 0755  BP: 120/65  Pulse: 95  Temp: 98.5 F (36.9 C)  Resp: 20   Filed Vitals:   08/28/15 0512 08/28/15 0600 08/28/15 0700 08/28/15 0755  BP: 161/69   120/65  Pulse: 84   95  Temp: 99.2 F (37.3 C) 99.7 F (37.6 C) 98.6 F (37 C) 98.5 F (36.9 C)  TempSrc: Oral Oral Oral Oral  Resp: 17   20  Height:       Weight: 62.687 kg (138 lb 3.2 oz)     SpO2: 91%   92%   General: A&O x 3, NAD, pleasant, cooperative Cardiovascular: RRR, no rub, no gallop, no S3 Respiratory: CTAB, no wheeze, no rhonchi Abdomen:soft, nontender, nondistended, positive bowel sounds Extremities: No edema, No lymphangitis, no petechiae  Discharge Instructions      Discharge Instructions    Diet - low sodium heart healthy    Complete by:  As directed      Increase activity slowly    Complete by:  As directed             Medication List    STOP taking these medications        cyclophosphamide 50 MG tablet  Commonly known as:  CYTOXAN      TAKE these medications        acetaminophen 325 MG tablet  Commonly known as:  TYLENOL  Take 650 mg by mouth every 6 (six) hours as needed for mild pain or moderate pain.     aspirin 81 MG EC tablet  Take 1 tablet (81 mg total) by mouth daily.     atorvastatin 40 MG tablet  Commonly known as:  LIPITOR  Take 20 mg by mouth daily.     carvedilol 3.125 MG tablet  Commonly known as:  COREG  Take 1 tablet (3.125 mg total) by mouth 2 (two) times daily with a meal.     ciprofloxacin 250 MG tablet  Commonly known as:  CIPRO  Take 1 tablet (250 mg total) by mouth 2 (two) times daily.     ezetimibe 10 MG tablet  Commonly known as:  ZETIA  Take 1 tablet (10 mg total) by mouth daily.     feeding supplement (ENSURE ENLIVE) Liqd  Take 237 mLs by mouth 2 (two) times daily between meals.     levothyroxine 100 MCG tablet  Commonly known as:  SYNTHROID, LEVOTHROID  Take 100 mcg by mouth daily before breakfast.     metolazone 2.5 MG tablet  Commonly known as:  ZAROXOLYN  Take 2.5 mg by mouth every Monday, Wednesday, and Friday.     metroNIDAZOLE 500 MG tablet  Commonly known as:  FLAGYL  Take 1 tablet (500 mg total) by mouth every 8 (eight) hours.     predniSONE 10 MG tablet  Commonly known as:  DELTASONE  Take 10 mg by mouth every other day.     torsemide 100 MG  tablet  Commonly known as:  DEMADEX  Take 100 mg by mouth daily.         The results of significant diagnostics from this hospitalization (including imaging, microbiology, ancillary and laboratory) are listed below for reference.    Significant Diagnostic Studies: Ct Abdomen Pelvis Wo Contrast  08/24/2015   CLINICAL DATA:  Fever of unknown origin. Increasing abdominal girth, lower extremity edema and shortness of breath. History of CAD, stage III CKD and nephrotic syndrome.  EXAM: CT CHEST, ABDOMEN AND PELVIS WITHOUT CONTRAST  TECHNIQUE: Multidetector CT imaging of the chest, abdomen and pelvis was performed following the standard protocol without IV contrast.  COMPARISON:  08/22/2015 chest radiograph.  FINDINGS: CT CHEST FINDINGS  Mediastinum/Nodes: Normal heart size. Minimal pericardial fluid/thickening. There is atherosclerosis of the thoracic aorta, the great vessels of the mediastinum and the coronary arteries, including calcified atherosclerotic plaque in the left anterior descending and right coronary arteries. Great vessels are normal in course and caliber. Normal visualized thyroid. Normal esophagus. No axillary, mediastinal or gross hilar lymphadenopathy, noting limited sensitivity for the detection of hilar adenopathy on this noncontrast study.  Lungs/Pleura: No pneumothorax. Small layering bilateral pleural effusions. Associated passive atelectasis in the dependent lower lobes and dependent basilar right upper lobe. Mild ground-glass opacity and interlobular septal thickening in both upper lobes is most in keeping with mild pulmonary edema. There is a 1.9 x 0.7 cm focus of subpleural consolidation in the medial left upper lobe (series 205/image 16).  Musculoskeletal: No aggressive appearing focal osseous lesions. Moderate degenerative changes in the thoracic spine.  CT ABDOMEN AND PELVIS FINDINGS  Hepatobiliary: Normal liver with no liver mass. Normal gallbladder with no radiopaque  cholelithiasis. No biliary ductal dilatation.  Pancreas: Normal, with no mass or duct dilation.  Spleen: Normal size. No mass.  Adrenals/Urinary Tract: Normal adrenals. Normal kidneys with no nephrolithiasis, no hydronephrosis and no contour-deforming renal mass. Collapsed and grossly normal urinary bladder.  Stomach/Bowel: Small hiatal hernia. Otherwise collapsed and grossly normal stomach. Normal caliber small bowel with no small bowel wall thickening. Appendix is not discretely visualized. There is a masslike 2.4 x 2.1 cm focus of colonic wall thickening in the posterior distal transverse colon (series 201/image 56 and series 203/image 28). Within the adjacent gastro-colic ligament, there is ill-defined mixed density material measuring 4.8 x 3.0 cm in maximum axial dimensions (series 201/image 57), which contains internal foci of fat density and hyperdensity, and which is confluent with the focal transverse colonic wall thickening. No extraluminal gas. The hyperdense material in the gastrocolic ligament is of uncertain etiology and could represent calcification (such as the sequela of prior infectious or inflammatory episode) or oral contrast (from prior leak as  no oral contrast has reached the transverse colon on this scan). Otherwise normal large bowel.  Vascular/Lymphatic: Atherosclerotic nonaneurysmal abdominal aorta. No pathologically enlarged lymph nodes in the abdomen or pelvis.  Reproductive: Grossly normal uterus.  No adnexal mass.  Other: No pneumoperitoneum.  Small volume ascites.  Musculoskeletal: Marked degenerative changes in the lumbar spine. Mild anasarca in the dependent subcutaneous soft tissues of the lower chest, flanks and pelvis.  IMPRESSION: 1. Small 1.9 x 0.7 cm focus of subpleural consolidation in the medial left upper lobe, indeterminate, possibly a small focus of infection. Recommend attention to this focus on a follow-up post treatment chest CT in 2-3 months. 2. Evidence of  third-spacing, including small bilateral pleural effusions, small volume ascites, mild pulmonary edema and mild anasarca. 3. Focal colonic wall thickening in the posterior distal transverse colon, cannot exclude colonic neoplasm. Recommend correlation with colonoscopy. 4. Nonspecific mixed density material in the gastro-colic ligament adjacent to the focus of colonic wall thickening in the transverse colon. Favor the sequela of prior infection or prior perforation. No free air on this study to suggest recent perforation. 5.  Atherosclerosis, including two-vessel coronary artery disease. 6. Small hiatal hernia.   Electronically Signed   By: Ilona Sorrel M.D.   On: 08/24/2015 19:12   Dg Chest 2 View  08/22/2015   CLINICAL DATA:  Fever with sweats for 1 day  EXAM: CHEST  2 VIEW  COMPARISON:  August 18, 2015  FINDINGS: There is mild atelectasis in the left base. Lungs elsewhere clear. Heart size and pulmonary vascularity are normal. No adenopathy. There is degenerative change in the thoracic spine. There is calcification in both carotid arteries.  IMPRESSION: Left base atelectasis. No frank edema or consolidation. Calcification in each carotid artery.   Electronically Signed   By: Lowella Grip III M.D.   On: 08/22/2015 18:36   Dg Chest 2 View  08/18/2015   CLINICAL DATA:  Shortness of breath.  Symptoms today.  EXAM: CHEST  2 VIEW  COMPARISON:  07/17/2015  FINDINGS: Linear atelectasis in the lingula. Right lung is clear. Heart is upper limits normal in size. No effusions. No acute bony abnormality.  IMPRESSION: Lingular atelectasis.   Electronically Signed   By: Rolm Baptise M.D.   On: 08/18/2015 18:08   Ct Chest Wo Contrast  08/24/2015   CLINICAL DATA:  Fever of unknown origin. Increasing abdominal girth, lower extremity edema and shortness of breath. History of CAD, stage III CKD and nephrotic syndrome.  EXAM: CT CHEST, ABDOMEN AND PELVIS WITHOUT CONTRAST  TECHNIQUE: Multidetector CT imaging of the  chest, abdomen and pelvis was performed following the standard protocol without IV contrast.  COMPARISON:  08/22/2015 chest radiograph.  FINDINGS: CT CHEST FINDINGS  Mediastinum/Nodes: Normal heart size. Minimal pericardial fluid/thickening. There is atherosclerosis of the thoracic aorta, the great vessels of the mediastinum and the coronary arteries, including calcified atherosclerotic plaque in the left anterior descending and right coronary arteries. Great vessels are normal in course and caliber. Normal visualized thyroid. Normal esophagus. No axillary, mediastinal or gross hilar lymphadenopathy, noting limited sensitivity for the detection of hilar adenopathy on this noncontrast study.  Lungs/Pleura: No pneumothorax. Small layering bilateral pleural effusions. Associated passive atelectasis in the dependent lower lobes and dependent basilar right upper lobe. Mild ground-glass opacity and interlobular septal thickening in both upper lobes is most in keeping with mild pulmonary edema. There is a 1.9 x 0.7 cm focus of subpleural consolidation in the medial left upper lobe (series 205/image  16).  Musculoskeletal: No aggressive appearing focal osseous lesions. Moderate degenerative changes in the thoracic spine.  CT ABDOMEN AND PELVIS FINDINGS  Hepatobiliary: Normal liver with no liver mass. Normal gallbladder with no radiopaque cholelithiasis. No biliary ductal dilatation.  Pancreas: Normal, with no mass or duct dilation.  Spleen: Normal size. No mass.  Adrenals/Urinary Tract: Normal adrenals. Normal kidneys with no nephrolithiasis, no hydronephrosis and no contour-deforming renal mass. Collapsed and grossly normal urinary bladder.  Stomach/Bowel: Small hiatal hernia. Otherwise collapsed and grossly normal stomach. Normal caliber small bowel with no small bowel wall thickening. Appendix is not discretely visualized. There is a masslike 2.4 x 2.1 cm focus of colonic wall thickening in the posterior distal transverse  colon (series 201/image 56 and series 203/image 28). Within the adjacent gastro-colic ligament, there is ill-defined mixed density material measuring 4.8 x 3.0 cm in maximum axial dimensions (series 201/image 57), which contains internal foci of fat density and hyperdensity, and which is confluent with the focal transverse colonic wall thickening. No extraluminal gas. The hyperdense material in the gastrocolic ligament is of uncertain etiology and could represent calcification (such as the sequela of prior infectious or inflammatory episode) or oral contrast (from prior leak as no oral contrast has reached the transverse colon on this scan). Otherwise normal large bowel.  Vascular/Lymphatic: Atherosclerotic nonaneurysmal abdominal aorta. No pathologically enlarged lymph nodes in the abdomen or pelvis.  Reproductive: Grossly normal uterus.  No adnexal mass.  Other: No pneumoperitoneum.  Small volume ascites.  Musculoskeletal: Marked degenerative changes in the lumbar spine. Mild anasarca in the dependent subcutaneous soft tissues of the lower chest, flanks and pelvis.  IMPRESSION: 1. Small 1.9 x 0.7 cm focus of subpleural consolidation in the medial left upper lobe, indeterminate, possibly a small focus of infection. Recommend attention to this focus on a follow-up post treatment chest CT in 2-3 months. 2. Evidence of third-spacing, including small bilateral pleural effusions, small volume ascites, mild pulmonary edema and mild anasarca. 3. Focal colonic wall thickening in the posterior distal transverse colon, cannot exclude colonic neoplasm. Recommend correlation with colonoscopy. 4. Nonspecific mixed density material in the gastro-colic ligament adjacent to the focus of colonic wall thickening in the transverse colon. Favor the sequela of prior infection or prior perforation. No free air on this study to suggest recent perforation. 5.  Atherosclerosis, including two-vessel coronary artery disease. 6. Small hiatal  hernia.   Electronically Signed   By: Ilona Sorrel M.D.   On: 08/24/2015 19:12   Dg Chest Port 1 View  08/26/2015   CLINICAL DATA:  Shortness of Breath  EXAM: PORTABLE CHEST 1 VIEW  COMPARISON:  Chest radiograph August 22, 2015 and chest CT August 24, 2015  FINDINGS: There is no edema or consolidation. The heart size and pulmonary vascularity are normal. No adenopathy. No bone lesions.  IMPRESSION: No edema or consolidation.   Electronically Signed   By: Lowella Grip III M.D.   On: 08/26/2015 12:21     Microbiology: Recent Results (from the past 240 hour(s))  Culture, blood (routine x 2)     Status: None   Collection Time: 08/22/15  2:50 PM  Result Value Ref Range Status   Specimen Description BLOOD RIGHT HAND  Final   Special Requests BOTTLES DRAWN AEROBIC ONLY  [place]  Final   Culture NO GROWTH 5 DAYS  Final   Report Status 08/27/2015 FINAL  Final  Urine culture     Status: None   Collection Time: 08/22/15  3:46  PM  Result Value Ref Range Status   Specimen Description URINE, CLEAN CATCH  Final   Special Requests Normal  Final   Culture   Final    40,000 COLONIES/ml GROUP B STREP(S.AGALACTIAE)ISOLATED TESTING AGAINST S. AGALACTIAE NOT ROUTINELY PERFORMED DUE TO PREDICTABILITY OF AMP/PEN/VAN SUSCEPTIBILITY. 10,000 COLONIES/mL [place]    Report Status 08/24/2015 FINAL  Final   Organism ID, Bacteria KLUYVERA CRYOCRESCENS  Final      Susceptibility   Kluyvera cryocrescens - MIC*    AMPICILLIN 8 RESISTANT Resistant     CEFAZOLIN <=4 RESISTANT Resistant     CEFTRIAXONE <=1 SENSITIVE Sensitive     CIPROFLOXACIN <=0.25 SENSITIVE Sensitive     GENTAMICIN <=1 SENSITIVE Sensitive     IMIPENEM <=0.25 SENSITIVE Sensitive     NITROFURANTOIN <=16 SENSITIVE Sensitive     TRIMETH/SULFA <=20 SENSITIVE Sensitive     AMPICILLIN/SULBACTAM <=2 SENSITIVE Sensitive     * 10,000 COLONIES/mL KLUYVERA CRYOCRESCENS  C difficile quick scan w PCR reflex     Status: None   Collection  Time: 08/24/15  8:04 PM  Result Value Ref Range Status   C Diff antigen NEGATIVE NEGATIVE Final   C Diff toxin NEGATIVE NEGATIVE Final   C Diff interpretation Negative for toxigenic C. difficile  Final     Labs: Basic Metabolic Panel:  Recent Labs Lab 08/23/15 0515 08/24/15 0524 08/25/15 0306 08/26/15 1345 08/27/15 0255 08/28/15 0328  NA 137 140 139 137 138 136  K 3.1* 4.0 3.7 3.3* 3.3* 3.8  CL 103 107 106 105 108 103  CO2 26 26 25 23 22 25   GLUCOSE 101* 100* 121* 166* 123* 110*  BUN 44* 43* 44* 45* 45* 43*  CREATININE 1.91* 1.88* 2.09* 1.90* 1.95* 2.10*  CALCIUM 7.6* 7.5* 7.5* 7.4* 7.3* 7.5*  MG 2.3 2.0  --   --   --   --    Liver Function Tests:  Recent Labs Lab 08/22/15 1452 08/25/15 0306  AST 35 102*  ALT 15 79*  ALKPHOS 51 52  BILITOT 0.2* 0.4  PROT 3.8* 3.9*  ALBUMIN <1.0* <1.0*   No results for input(s): LIPASE, AMYLASE in the last 168 hours. No results for input(s): AMMONIA in the last 168 hours. CBC:  Recent Labs Lab 08/22/15 0312 08/23/15 0515 08/25/15 0306 08/27/15 0255  WBC 3.0* 3.6* 3.6* 3.6*  NEUTROABS  --   --  3.2  --   HGB 9.7* 9.7* 9.4* 8.9*  HCT 29.4* 29.7* 28.7* 27.6*  MCV 90.5 89.7 92.0 91.7  PLT 224 242 231 208   Cardiac Enzymes: No results for input(s): CKTOTAL, CKMB, CKMBINDEX, TROPONINI in the last 168 hours. BNP: Invalid input(s): POCBNP CBG: No results for input(s): GLUCAP in the last 168 hours.  Time coordinating discharge:  Greater than 30 minutes  Signed:  Mariadejesus Cade, DO [facility] Pager: [phone] 08/28/2015, 8:44 AM

## 2015-08-27 NOTE — Plan of Care (Signed)
Problem: Phase I Progression Outcomes Goal: EF % per last Echo/documented,Core Reminder form on chart Outcome: Completed/Met Date Met:  08/27/15 EF 60-65%(07-18-15)

## 2015-08-28 DIAGNOSIS — E038 Other specified hypothyroidism: Secondary | ICD-10-CM

## 2015-08-28 LAB — BASIC METABOLIC PANEL
ANION GAP: 8 (ref 5–15)
BUN: 43 mg/dL — AB (ref 6–20)
CHLORIDE: 103 mmol/L (ref 101–111)
CO2: 25 mmol/L (ref 22–32)
Calcium: 7.5 mg/dL — ABNORMAL LOW (ref 8.9–10.3)
Creatinine, Ser: 2.1 mg/dL — ABNORMAL HIGH (ref 0.44–1.00)
GFR calc Af Amer: 24 mL/min — ABNORMAL LOW (ref >60)
GFR calc non Af Amer: 20 mL/min — ABNORMAL LOW (ref >60)
GLUCOSE: 110 mg/dL — AB (ref 65–99)
POTASSIUM: 3.8 mmol/L (ref 3.5–5.1)
Sodium: 136 mmol/L (ref 135–145)

## 2015-08-28 MED ORDER — METRONIDAZOLE 500 MG PO TABS
500.0000 mg | ORAL_TABLET | Freq: Three times a day (TID) | ORAL | Status: DC
Start: 2015-08-28 — End: 2015-12-25

## 2015-08-28 MED ORDER — CIPROFLOXACIN HCL 250 MG PO TABS: 250.0000 mg | ORAL_TABLET | Freq: Two times a day (BID) | ORAL | Status: DC

## 2015-08-28 MED ORDER — CARVEDILOL 3.125 MG PO TABS: 3.1250 mg | ORAL_TABLET | Freq: Two times a day (BID) | ORAL | Status: AC

## 2015-08-28 NOTE — Care Management Important Message (Signed)
Important Message  Patient Details  Name: Erika [name] MRN: [id number] Date of Birth: [birth year]   Medicare Important Message Given:  Yes-fourth notification given    Delorse Lek 08/28/2015, 1:27 PM

## 2015-08-28 NOTE — Progress Notes (Signed)
Reviewed all d/c instructions.  Patient stated understanding of f/u appts, medications, and prescriptions called in to her pharmacy.  Husband providing transport to home.  No voiced complaints.

## 2015-08-28 NOTE — Progress Notes (Signed)
PT Cancellation Note  Patient Details Name: [name] MRN: [id number] DOB: 1928-10-18   Cancelled Treatment:    Reason Eval/Treat Not Completed: Other (comment) (Pt refused.  "I'm going home."  Pt has all needed A and equipment.  Husband present and agrees with plan.  Decline HH services as well.)  Thanks.   Irwin Brakeman F 08/28/2015, 10:54 AM [facility] [phone] (pager)

## 2015-09-22 ENCOUNTER — Inpatient Hospital Stay (HOSPITAL_COMMUNITY): Admission: RE | Admit: 2015-09-22 | Payer: Medicare Other | Source: Ambulatory Visit

## 2015-09-25 ENCOUNTER — Encounter (HOSPITAL_COMMUNITY)
Admission: RE | Admit: 2015-09-25 | Discharge: 2015-09-25 | Disposition: A | Discharge status: Home / Self Care | Payer: Medicare Other | Source: Ambulatory Visit | Attending: Nephrology | Admitting: Nephrology

## 2015-09-25 DIAGNOSIS — D631 Anemia in chronic kidney disease: Secondary | ICD-10-CM | POA: Insufficient documentation

## 2015-09-25 DIAGNOSIS — Z79899 Other long term (current) drug therapy: Secondary | ICD-10-CM | POA: Diagnosis not present

## 2015-09-25 DIAGNOSIS — Z5181 Encounter for therapeutic drug level monitoring: Secondary | ICD-10-CM | POA: Insufficient documentation

## 2015-09-25 DIAGNOSIS — N182 Chronic kidney disease, stage 2 (mild): Secondary | ICD-10-CM | POA: Insufficient documentation

## 2015-09-25 LAB — POCT HEMOGLOBIN-HEMACUE: HEMOGLOBIN: 9.5 g/dL — AB (ref 12.0–15.0)

## 2015-09-25 MED ORDER — DARBEPOETIN ALFA 40 MCG/0.4ML IJ SOSY
40.0000 ug | PREFILLED_SYRINGE | Freq: Once | INTRAMUSCULAR | Status: DC
  Filled 2015-09-25: qty 0.4

## 2015-09-25 MED ORDER — DARBEPOETIN ALFA 40 MCG/0.4ML IJ SOSY
40.0000 ug | PREFILLED_SYRINGE | INTRAMUSCULAR | Status: DC
  Administered 2015-09-25: 40 ug via SUBCUTANEOUS
  Filled 2015-09-25: qty 0.4

## 2015-10-09 ENCOUNTER — Encounter (HOSPITAL_COMMUNITY)
Admission: RE | Admit: 2015-10-09 | Discharge: 2015-10-09 | Disposition: A | Discharge status: Home / Self Care | Payer: Medicare Other | Source: Ambulatory Visit | Attending: Nephrology | Admitting: Nephrology

## 2015-10-09 DIAGNOSIS — Z5181 Encounter for therapeutic drug level monitoring: Secondary | ICD-10-CM | POA: Diagnosis not present

## 2015-10-09 DIAGNOSIS — N182 Chronic kidney disease, stage 2 (mild): Secondary | ICD-10-CM | POA: Diagnosis present

## 2015-10-09 DIAGNOSIS — D631 Anemia in chronic kidney disease: Secondary | ICD-10-CM | POA: Diagnosis not present

## 2015-10-09 DIAGNOSIS — Z79899 Other long term (current) drug therapy: Secondary | ICD-10-CM | POA: Insufficient documentation

## 2015-10-09 LAB — COMPREHENSIVE METABOLIC PANEL
ALBUMIN: 1.3 g/dL — AB (ref 3.5–5.0)
ALK PHOS: 72 U/L (ref 38–126)
ALT: 12 U/L — AB (ref 14–54)
AST: 29 U/L (ref 15–41)
Anion gap: 8 (ref 5–15)
BUN: 30 mg/dL — ABNORMAL HIGH (ref 6–20)
CALCIUM: 8.6 mg/dL — AB (ref 8.9–10.3)
CHLORIDE: 106 mmol/L (ref 101–111)
CO2: 27 mmol/L (ref 22–32)
CREATININE: 1.76 mg/dL — AB (ref 0.44–1.00)
GFR calc Af Amer: 29 mL/min — ABNORMAL LOW (ref >60)
GFR calc non Af Amer: 25 mL/min — ABNORMAL LOW (ref >60)
GLUCOSE: 205 mg/dL — AB (ref 65–99)
Potassium: 4.1 mmol/L (ref 3.5–5.1)
SODIUM: 141 mmol/L (ref 135–145)
Total Bilirubin: 0.5 mg/dL (ref 0.3–1.2)
Total Protein: 5.6 g/dL — ABNORMAL LOW (ref 6.5–8.1)

## 2015-10-09 LAB — CBC
HCT: 31.3 % — ABNORMAL LOW (ref 36.0–46.0)
Hemoglobin: 9.8 g/dL — ABNORMAL LOW (ref 12.0–15.0)
MCH: 30.2 pg (ref 26.0–34.0)
MCHC: 31.3 g/dL (ref 30.0–36.0)
MCV: 96.3 fL (ref 78.0–100.0)
PLATELETS: 297 10*3/uL (ref 150–400)
RBC: 3.25 MIL/uL — ABNORMAL LOW (ref 3.87–5.11)
RDW: 16.9 % — ABNORMAL HIGH (ref 11.5–15.5)
WBC: 6.6 10*3/uL (ref 4.0–10.5)

## 2015-10-09 LAB — IRON AND TIBC
Iron: 44 ug/dL (ref 28–170)
Saturation Ratios: 26 % (ref 10.4–31.8)
TIBC: 167 ug/dL — ABNORMAL LOW (ref 250–450)
UIBC: 123 ug/dL

## 2015-10-09 LAB — FERRITIN: Ferritin: 715 ng/mL — ABNORMAL HIGH (ref 11–307)

## 2015-10-09 MED ORDER — DARBEPOETIN ALFA 40 MCG/0.4ML IJ SOSY
40.0000 ug | PREFILLED_SYRINGE | INTRAMUSCULAR | Status: DC
  Administered 2015-10-09: 40 ug via SUBCUTANEOUS
  Filled 2015-10-09: qty 0.4

## 2015-10-23 ENCOUNTER — Encounter (HOSPITAL_COMMUNITY)
Admission: RE | Admit: 2015-10-23 | Discharge: 2015-10-23 | Disposition: A | Discharge status: Home / Self Care | Payer: Medicare Other | Source: Ambulatory Visit | Attending: Nephrology | Admitting: Nephrology

## 2015-10-23 DIAGNOSIS — N182 Chronic kidney disease, stage 2 (mild): Secondary | ICD-10-CM | POA: Diagnosis not present

## 2015-10-23 LAB — POCT HEMOGLOBIN-HEMACUE: Hemoglobin: 9.3 g/dL — ABNORMAL LOW (ref 12.0–15.0)

## 2015-10-23 MED ORDER — DARBEPOETIN ALFA 40 MCG/0.4ML IJ SOSY
40.0000 ug | PREFILLED_SYRINGE | INTRAMUSCULAR | Status: DC
  Administered 2015-10-23: 40 ug via SUBCUTANEOUS
  Filled 2015-10-23: qty 0.4

## 2015-10-28 IMAGING — CR DG CHEST 1V PORT
1 series · 1 of 1 positions shown · non-contrast
Comparison: Chest radiograph August 22, 2015 and chest CT
August 24, 2015

CLINICAL DATA: Shortness of Breath

EXAM:
PORTABLE CHEST 1 VIEW

[AP]
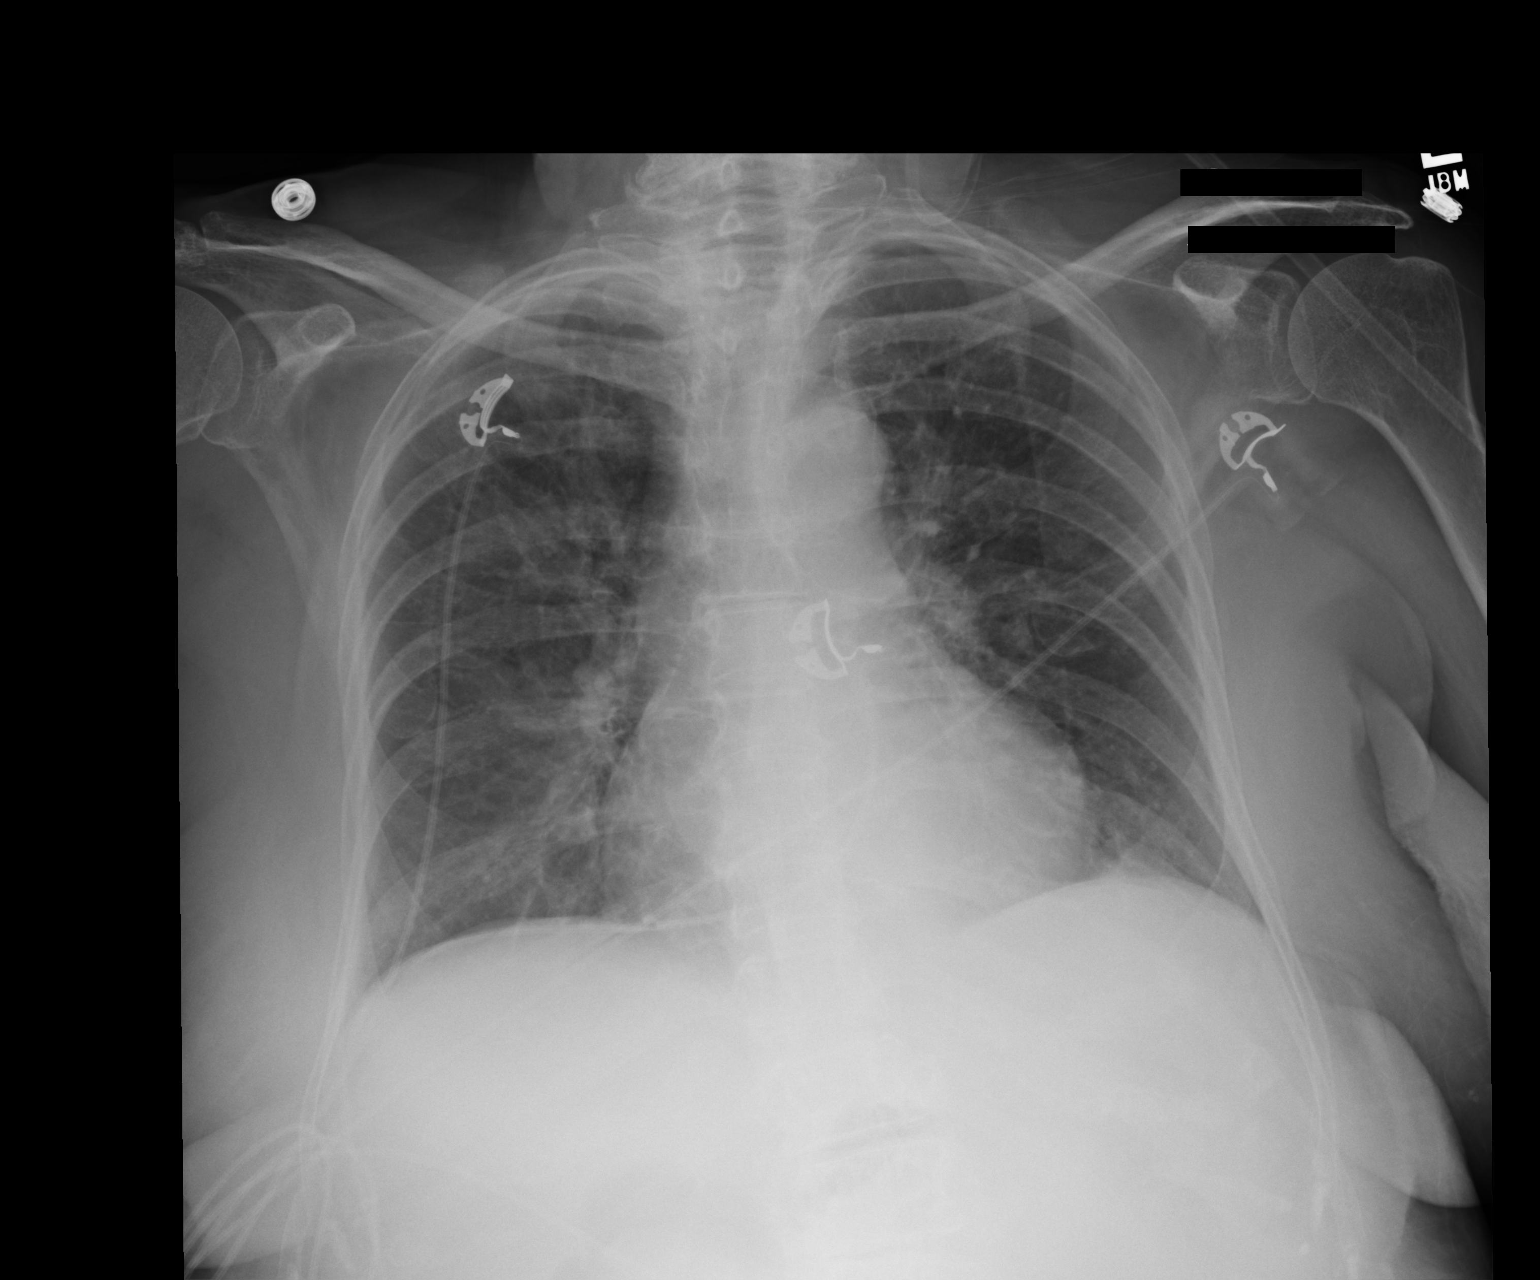

[1 of 1 positions shown; findings below may reference images not displayed]

FINDINGS: There is no edema or consolidation. The heart size and pulmonary
vascularity are normal. No adenopathy. No bone lesions.
IMPRESSION: No edema or consolidation.

## 2015-11-06 ENCOUNTER — Encounter (HOSPITAL_COMMUNITY)
Admission: RE | Admit: 2015-11-06 | Discharge: 2015-11-06 | Disposition: A | Discharge status: Home / Self Care | Payer: Medicare Other | Source: Ambulatory Visit | Attending: Nephrology | Admitting: Nephrology

## 2015-11-06 DIAGNOSIS — Z5181 Encounter for therapeutic drug level monitoring: Secondary | ICD-10-CM | POA: Insufficient documentation

## 2015-11-06 DIAGNOSIS — Z79899 Other long term (current) drug therapy: Secondary | ICD-10-CM | POA: Insufficient documentation

## 2015-11-06 DIAGNOSIS — N182 Chronic kidney disease, stage 2 (mild): Secondary | ICD-10-CM | POA: Insufficient documentation

## 2015-11-06 DIAGNOSIS — D631 Anemia in chronic kidney disease: Secondary | ICD-10-CM | POA: Insufficient documentation

## 2015-11-06 LAB — COMPREHENSIVE METABOLIC PANEL
ALT: 10 U/L — ABNORMAL LOW (ref 14–54)
AST: 21 U/L (ref 15–41)
Albumin: 1.6 g/dL — ABNORMAL LOW (ref 3.5–5.0)
Alkaline Phosphatase: 79 U/L (ref 38–126)
Anion gap: 8 (ref 5–15)
BILIRUBIN TOTAL: 0.7 mg/dL (ref 0.3–1.2)
BUN: 24 mg/dL — AB (ref 6–20)
CO2: 28 mmol/L (ref 22–32)
Calcium: 9.1 mg/dL (ref 8.9–10.3)
Chloride: 106 mmol/L (ref 101–111)
Creatinine, Ser: 1.63 mg/dL — ABNORMAL HIGH (ref 0.44–1.00)
GFR, EST AFRICAN AMERICAN: 32 mL/min — AB (ref >60)
GFR, EST NON AFRICAN AMERICAN: 28 mL/min — AB (ref >60)
Glucose, Bld: 166 mg/dL — ABNORMAL HIGH (ref 65–99)
POTASSIUM: 4.4 mmol/L (ref 3.5–5.1)
Sodium: 142 mmol/L (ref 135–145)
TOTAL PROTEIN: 5.7 g/dL — AB (ref 6.5–8.1)

## 2015-11-06 LAB — CBC
HEMATOCRIT: 33.3 % — AB (ref 36.0–46.0)
Hemoglobin: 10.1 g/dL — ABNORMAL LOW (ref 12.0–15.0)
MCH: 29.1 pg (ref 26.0–34.0)
MCHC: 30.3 g/dL (ref 30.0–36.0)
MCV: 96 fL (ref 78.0–100.0)
Platelets: 345 10*3/uL (ref 150–400)
RBC: 3.47 MIL/uL — ABNORMAL LOW (ref 3.87–5.11)
RDW: 16.1 % — AB (ref 11.5–15.5)
WBC: 7.8 10*3/uL (ref 4.0–10.5)

## 2015-11-06 MED ORDER — DARBEPOETIN ALFA 40 MCG/0.4ML IJ SOSY
40.0000 ug | PREFILLED_SYRINGE | INTRAMUSCULAR | Status: DC
  Administered 2015-11-06: 40 ug via SUBCUTANEOUS

## 2015-11-06 MED ORDER — DARBEPOETIN ALFA 40 MCG/0.4ML IJ SOSY
PREFILLED_SYRINGE | INTRAMUSCULAR | Status: AC
  Filled 2015-11-06: qty 0.4

## 2015-11-17 ENCOUNTER — Other Ambulatory Visit (HOSPITAL_COMMUNITY): Payer: Self-pay | Admitting: *Deleted

## 2015-11-21 ENCOUNTER — Encounter (HOSPITAL_COMMUNITY)
Admission: RE | Admit: 2015-11-21 | Discharge: 2015-11-21 | Disposition: A | Discharge status: Home / Self Care | Payer: Medicare Other | Source: Ambulatory Visit | Attending: Nephrology | Admitting: Nephrology

## 2015-11-21 DIAGNOSIS — N182 Chronic kidney disease, stage 2 (mild): Secondary | ICD-10-CM | POA: Diagnosis not present

## 2015-11-21 LAB — URINALYSIS W MICROSCOPIC (NOT AT ARMC)
Bilirubin Urine: NEGATIVE
GLUCOSE, UA: 250 mg/dL — AB
KETONES UR: NEGATIVE mg/dL
Nitrite: NEGATIVE
Specific Gravity, Urine: 1.033 — ABNORMAL HIGH (ref 1.005–1.030)
pH: 5.5 (ref 5.0–8.0)

## 2015-11-21 LAB — PHOSPHORUS: Phosphorus: 4.5 mg/dL (ref 2.5–4.6)

## 2015-11-21 LAB — POCT HEMOGLOBIN-HEMACUE: Hemoglobin: 9.4 g/dL — ABNORMAL LOW (ref 12.0–15.0)

## 2015-11-21 MED ORDER — DARBEPOETIN ALFA 40 MCG/0.4ML IJ SOSY
40.0000 ug | PREFILLED_SYRINGE | INTRAMUSCULAR | Status: DC
  Administered 2015-11-21: 40 ug via SUBCUTANEOUS

## 2015-11-24 LAB — URINE CULTURE

## 2015-12-05 ENCOUNTER — Inpatient Hospital Stay (HOSPITAL_COMMUNITY): Admission: RE | Admit: 2015-12-05 | Payer: Medicare Other | Source: Ambulatory Visit

## 2015-12-12 ENCOUNTER — Encounter (HOSPITAL_COMMUNITY)
Admission: RE | Admit: 2015-12-12 | Discharge: 2015-12-12 | Disposition: A | Discharge status: Home / Self Care | Payer: Medicare Other | Source: Ambulatory Visit | Attending: Nephrology | Admitting: Nephrology

## 2015-12-12 DIAGNOSIS — Z79899 Other long term (current) drug therapy: Secondary | ICD-10-CM | POA: Diagnosis not present

## 2015-12-12 DIAGNOSIS — N182 Chronic kidney disease, stage 2 (mild): Secondary | ICD-10-CM | POA: Insufficient documentation

## 2015-12-12 DIAGNOSIS — D631 Anemia in chronic kidney disease: Secondary | ICD-10-CM | POA: Diagnosis not present

## 2015-12-12 DIAGNOSIS — Z5181 Encounter for therapeutic drug level monitoring: Secondary | ICD-10-CM | POA: Insufficient documentation

## 2015-12-12 LAB — CBC
HEMATOCRIT: 34.6 % — AB (ref 36.0–46.0)
Hemoglobin: 10.7 g/dL — ABNORMAL LOW (ref 12.0–15.0)
MCH: 29.7 pg (ref 26.0–34.0)
MCHC: 30.9 g/dL (ref 30.0–36.0)
MCV: 96.1 fL (ref 78.0–100.0)
PLATELETS: 344 10*3/uL (ref 150–400)
RBC: 3.6 MIL/uL — AB (ref 3.87–5.11)
RDW: 15.5 % (ref 11.5–15.5)
WBC: 5.9 10*3/uL (ref 4.0–10.5)

## 2015-12-12 LAB — IRON AND TIBC
Iron: 28 ug/dL (ref 28–170)
Saturation Ratios: 14 % (ref 10.4–31.8)
TIBC: 195 ug/dL — ABNORMAL LOW (ref 250–450)
UIBC: 167 ug/dL

## 2015-12-12 LAB — COMPREHENSIVE METABOLIC PANEL
ALBUMIN: 1.9 g/dL — AB (ref 3.5–5.0)
ALT: 9 U/L — AB (ref 14–54)
AST: 21 U/L (ref 15–41)
Alkaline Phosphatase: 67 U/L (ref 38–126)
Anion gap: 12 (ref 5–15)
BILIRUBIN TOTAL: 0.5 mg/dL (ref 0.3–1.2)
BUN: 43 mg/dL — AB (ref 6–20)
CHLORIDE: 106 mmol/L (ref 101–111)
CO2: 26 mmol/L (ref 22–32)
CREATININE: 2.76 mg/dL — AB (ref 0.44–1.00)
Calcium: 9.9 mg/dL (ref 8.9–10.3)
GFR calc Af Amer: 17 mL/min — ABNORMAL LOW (ref >60)
GFR, EST NON AFRICAN AMERICAN: 15 mL/min — AB (ref >60)
GLUCOSE: 176 mg/dL — AB (ref 65–99)
Potassium: 5.7 mmol/L — ABNORMAL HIGH (ref 3.5–5.1)
Sodium: 144 mmol/L (ref 135–145)
Total Protein: 6.4 g/dL — ABNORMAL LOW (ref 6.5–8.1)

## 2015-12-12 LAB — FERRITIN: Ferritin: 430 ng/mL — ABNORMAL HIGH (ref 11–307)

## 2015-12-12 MED ORDER — DARBEPOETIN ALFA 40 MCG/0.4ML IJ SOSY
PREFILLED_SYRINGE | INTRAMUSCULAR | Status: AC
  Filled 2015-12-12: qty 0.4

## 2015-12-12 MED ORDER — DARBEPOETIN ALFA 40 MCG/0.4ML IJ SOSY
40.0000 ug | PREFILLED_SYRINGE | INTRAMUSCULAR | Status: DC
  Administered 2015-12-12: 40 ug via SUBCUTANEOUS

## 2015-12-25 ENCOUNTER — Observation Stay (HOSPITAL_COMMUNITY)
Admission: EM | Admit: 2015-12-25 | Discharge: 2015-12-26 | Disposition: A | Discharge status: Home / Self Care | Payer: Medicare Other | Attending: Internal Medicine | Admitting: Internal Medicine

## 2015-12-25 ENCOUNTER — Emergency Department (HOSPITAL_COMMUNITY): Payer: Medicare Other

## 2015-12-25 ENCOUNTER — Encounter (HOSPITAL_COMMUNITY): Payer: Self-pay | Admitting: Family Medicine

## 2015-12-25 DIAGNOSIS — I519 Heart disease, unspecified: Secondary | ICD-10-CM | POA: Diagnosis not present

## 2015-12-25 DIAGNOSIS — I5033 Acute on chronic diastolic (congestive) heart failure: Secondary | ICD-10-CM | POA: Diagnosis not present

## 2015-12-25 DIAGNOSIS — R1110 Vomiting, unspecified: Secondary | ICD-10-CM | POA: Diagnosis not present

## 2015-12-25 DIAGNOSIS — Z792 Long term (current) use of antibiotics: Secondary | ICD-10-CM | POA: Insufficient documentation

## 2015-12-25 DIAGNOSIS — R112 Nausea with vomiting, unspecified: Secondary | ICD-10-CM | POA: Diagnosis present

## 2015-12-25 DIAGNOSIS — I10 Essential (primary) hypertension: Secondary | ICD-10-CM | POA: Diagnosis not present

## 2015-12-25 DIAGNOSIS — R739 Hyperglycemia, unspecified: Secondary | ICD-10-CM | POA: Diagnosis present

## 2015-12-25 DIAGNOSIS — Z66 Do not resuscitate: Secondary | ICD-10-CM | POA: Insufficient documentation

## 2015-12-25 DIAGNOSIS — I4510 Unspecified right bundle-branch block: Secondary | ICD-10-CM | POA: Insufficient documentation

## 2015-12-25 DIAGNOSIS — Z515 Encounter for palliative care: Secondary | ICD-10-CM | POA: Insufficient documentation

## 2015-12-25 DIAGNOSIS — M8580 Other specified disorders of bone density and structure, unspecified site: Secondary | ICD-10-CM | POA: Diagnosis not present

## 2015-12-25 DIAGNOSIS — Z9889 Other specified postprocedural states: Secondary | ICD-10-CM | POA: Diagnosis not present

## 2015-12-25 DIAGNOSIS — G43A0 Cyclical vomiting, not intractable: Principal | ICD-10-CM | POA: Insufficient documentation

## 2015-12-25 DIAGNOSIS — Z9181 History of falling: Secondary | ICD-10-CM | POA: Insufficient documentation

## 2015-12-25 DIAGNOSIS — E785 Hyperlipidemia, unspecified: Secondary | ICD-10-CM | POA: Diagnosis present

## 2015-12-25 DIAGNOSIS — I2510 Atherosclerotic heart disease of native coronary artery without angina pectoris: Secondary | ICD-10-CM | POA: Diagnosis present

## 2015-12-25 DIAGNOSIS — D638 Anemia in other chronic diseases classified elsewhere: Secondary | ICD-10-CM

## 2015-12-25 DIAGNOSIS — N179 Acute kidney failure, unspecified: Secondary | ICD-10-CM

## 2015-12-25 DIAGNOSIS — N390 Urinary tract infection, site not specified: Secondary | ICD-10-CM

## 2015-12-25 DIAGNOSIS — Z79899 Other long term (current) drug therapy: Secondary | ICD-10-CM | POA: Insufficient documentation

## 2015-12-25 DIAGNOSIS — Z7982 Long term (current) use of aspirin: Secondary | ICD-10-CM | POA: Insufficient documentation

## 2015-12-25 DIAGNOSIS — I252 Old myocardial infarction: Secondary | ICD-10-CM | POA: Diagnosis not present

## 2015-12-25 DIAGNOSIS — R109 Unspecified abdominal pain: Secondary | ICD-10-CM | POA: Insufficient documentation

## 2015-12-25 DIAGNOSIS — I5189 Other ill-defined heart diseases: Secondary | ICD-10-CM

## 2015-12-25 DIAGNOSIS — Z7189 Other specified counseling: Secondary | ICD-10-CM | POA: Insufficient documentation

## 2015-12-25 DIAGNOSIS — E039 Hypothyroidism, unspecified: Secondary | ICD-10-CM | POA: Diagnosis not present

## 2015-12-25 DIAGNOSIS — N184 Chronic kidney disease, stage 4 (severe): Secondary | ICD-10-CM

## 2015-12-25 HISTORY — DX: Other ill-defined heart diseases: I51.89

## 2015-12-25 HISTORY — DX: Hyperglycemia, unspecified: R73.9

## 2015-12-25 LAB — CBC
HEMATOCRIT: 32.5 % — AB (ref 36.0–46.0)
HEMOGLOBIN: 10.3 g/dL — AB (ref 12.0–15.0)
MCH: 29.4 pg (ref 26.0–34.0)
MCHC: 31.7 g/dL (ref 30.0–36.0)
MCV: 92.9 fL (ref 78.0–100.0)
Platelets: 332 10*3/uL (ref 150–400)
RBC: 3.5 MIL/uL — AB (ref 3.87–5.11)
RDW: 15.4 % (ref 11.5–15.5)
WBC: 7.5 10*3/uL (ref 4.0–10.5)

## 2015-12-25 LAB — URINALYSIS, ROUTINE W REFLEX MICROSCOPIC
Bilirubin Urine: NEGATIVE
GLUCOSE, UA: 100 mg/dL — AB
Ketones, ur: NEGATIVE mg/dL
NITRITE: NEGATIVE
PH: 5.5 (ref 5.0–8.0)
Protein, ur: >300 mg/dL — AB
SPECIFIC GRAVITY, URINE: 1.019 (ref 1.005–1.030)

## 2015-12-25 LAB — COMPREHENSIVE METABOLIC PANEL
ALBUMIN: 1.7 g/dL — AB (ref 3.5–5.0)
ALT: 7 U/L — ABNORMAL LOW (ref 14–54)
ANION GAP: 13 (ref 5–15)
AST: 17 U/L (ref 15–41)
Alkaline Phosphatase: 64 U/L (ref 38–126)
BILIRUBIN TOTAL: 0.4 mg/dL (ref 0.3–1.2)
BUN: 47 mg/dL — ABNORMAL HIGH (ref 6–20)
CO2: 25 mmol/L (ref 22–32)
Calcium: 8.8 mg/dL — ABNORMAL LOW (ref 8.9–10.3)
Chloride: 106 mmol/L (ref 101–111)
Creatinine, Ser: 3.67 mg/dL — ABNORMAL HIGH (ref 0.44–1.00)
GFR calc Af Amer: 12 mL/min — ABNORMAL LOW (ref >60)
GFR, EST NON AFRICAN AMERICAN: 10 mL/min — AB (ref >60)
Glucose, Bld: 165 mg/dL — ABNORMAL HIGH (ref 65–99)
POTASSIUM: 4.1 mmol/L (ref 3.5–5.1)
Sodium: 144 mmol/L (ref 135–145)
TOTAL PROTEIN: 5.9 g/dL — AB (ref 6.5–8.1)

## 2015-12-25 LAB — I-STAT TROPONIN, ED: TROPONIN I, POC: 0.02 ng/mL (ref 0.00–0.08)

## 2015-12-25 LAB — URINE MICROSCOPIC-ADD ON

## 2015-12-25 LAB — LIPASE, BLOOD: Lipase: 78 U/L — ABNORMAL HIGH (ref 11–51)

## 2015-12-25 LAB — GLUCOSE, CAPILLARY
GLUCOSE-CAPILLARY: 81 mg/dL (ref 65–99)
Glucose-Capillary: 124 mg/dL — ABNORMAL HIGH (ref 65–99)

## 2015-12-25 MED ORDER — ONDANSETRON HCL 4 MG/2ML IJ SOLN
4.0000 mg | INTRAMUSCULAR | Status: DC
  Administered 2015-12-25 – 2015-12-26 (×4): 4 mg via INTRAVENOUS
  Filled 2015-12-25 (×4): qty 2

## 2015-12-25 MED ORDER — LEVOTHYROXINE SODIUM 100 MCG PO TABS
100.0000 ug | ORAL_TABLET | Freq: Every day | ORAL | Status: DC
  Administered 2015-12-26: 100 ug via ORAL
  Filled 2015-12-25: qty 1

## 2015-12-25 MED ORDER — HYDROCODONE-ACETAMINOPHEN 5-325 MG PO TABS: 1.0000 | ORAL_TABLET | ORAL | Status: DC | PRN

## 2015-12-25 MED ORDER — CARVEDILOL 3.125 MG PO TABS
3.1250 mg | ORAL_TABLET | Freq: Two times a day (BID) | ORAL | Status: DC
  Administered 2015-12-25 – 2015-12-26 (×2): 3.125 mg via ORAL
  Filled 2015-12-25 (×2): qty 1

## 2015-12-25 MED ORDER — ASPIRIN EC 81 MG PO TBEC
81.0000 mg | DELAYED_RELEASE_TABLET | Freq: Every day | ORAL | Status: DC
  Administered 2015-12-26: 81 mg via ORAL
  Filled 2015-12-25 (×2): qty 1

## 2015-12-25 MED ORDER — DEXTROSE 5 % IV SOLN
1.0000 g | INTRAVENOUS | Status: DC
  Administered 2015-12-25: 1 g via INTRAVENOUS
  Filled 2015-12-25 (×2): qty 10

## 2015-12-25 MED ORDER — METOCLOPRAMIDE HCL 5 MG/ML IJ SOLN
10.0000 mg | Freq: Three times a day (TID) | INTRAMUSCULAR | Status: DC
  Administered 2015-12-25 – 2015-12-26 (×3): 10 mg via INTRAVENOUS
  Filled 2015-12-25 (×2): qty 2

## 2015-12-25 MED ORDER — ACETAMINOPHEN 650 MG RE SUPP: 650.0000 mg | Freq: Four times a day (QID) | RECTAL | Status: DC | PRN

## 2015-12-25 MED ORDER — ATORVASTATIN CALCIUM 20 MG PO TABS: 20.0000 mg | ORAL_TABLET | Freq: Every day | ORAL | Status: DC

## 2015-12-25 MED ORDER — ONDANSETRON HCL 4 MG/2ML IJ SOLN
4.0000 mg | Freq: Four times a day (QID) | INTRAMUSCULAR | Status: DC | PRN
  Administered 2015-12-25: 4 mg via INTRAVENOUS
  Filled 2015-12-25: qty 2

## 2015-12-25 MED ORDER — BISACODYL 10 MG RE SUPP: 10.0000 mg | Freq: Every day | RECTAL | Status: DC | PRN

## 2015-12-25 MED ORDER — SODIUM CHLORIDE 0.45 % IV SOLN
INTRAVENOUS | Status: AC
  Administered 2015-12-25: 19:00:00 via INTRAVENOUS

## 2015-12-25 MED ORDER — SODIUM CHLORIDE 0.9 % IV BOLUS (SEPSIS)
1000.0000 mL | Freq: Once | INTRAVENOUS | Status: AC
  Administered 2015-12-25: 1000 mL via INTRAVENOUS

## 2015-12-25 MED ORDER — ACETAMINOPHEN 325 MG PO TABS: 650.0000 mg | ORAL_TABLET | Freq: Four times a day (QID) | ORAL | Status: DC | PRN

## 2015-12-25 MED ORDER — ONDANSETRON HCL 4 MG PO TABS: 4.0000 mg | ORAL_TABLET | Freq: Four times a day (QID) | ORAL | Status: DC | PRN

## 2015-12-25 MED ORDER — HEPARIN SODIUM (PORCINE) 5000 UNIT/ML IJ SOLN
5000.0000 [IU] | Freq: Three times a day (TID) | INTRAMUSCULAR | Status: DC
  Administered 2015-12-25 – 2015-12-26 (×3): 5000 [IU] via SUBCUTANEOUS
  Filled 2015-12-25 (×3): qty 1

## 2015-12-25 MED ORDER — POLYETHYLENE GLYCOL 3350 17 G PO PACK: 17.0000 g | PACK | Freq: Every day | ORAL | Status: DC | PRN

## 2015-12-25 MED ORDER — TRAZODONE HCL 50 MG PO TABS
25.0000 mg | ORAL_TABLET | Freq: Every evening | ORAL | Status: DC | PRN
  Filled 2015-12-25: qty 1

## 2015-12-25 MED ORDER — FLEET ENEMA 7-19 GM/118ML RE ENEM
1.0000 | ENEMA | Freq: Once | RECTAL | Status: DC | PRN
Start: 2015-12-25 — End: 2015-12-26
  Filled 2015-12-25: qty 1

## 2015-12-25 MED ORDER — MORPHINE SULFATE (PF) 2 MG/ML IV SOLN: 1.0000 mg | INTRAVENOUS | Status: DC | PRN

## 2015-12-25 MED ORDER — ONDANSETRON HCL 4 MG/2ML IJ SOLN
4.0000 mg | Freq: Once | INTRAMUSCULAR | Status: AC
  Administered 2015-12-25: 4 mg via INTRAVENOUS
  Filled 2015-12-25: qty 2

## 2015-12-25 MED ORDER — PREDNISONE 10 MG PO TABS
10.0000 mg | ORAL_TABLET | ORAL | Status: DC
  Filled 2015-12-25 (×2): qty 1

## 2015-12-25 MED ORDER — INSULIN ASPART 100 UNIT/ML ~~LOC~~ SOLN: 0.0000 [IU] | Freq: Three times a day (TID) | SUBCUTANEOUS | Status: DC

## 2015-12-25 MED ORDER — ATORVASTATIN CALCIUM 20 MG PO TABS
20.0000 mg | ORAL_TABLET | Freq: Every day | ORAL | Status: DC
  Filled 2015-12-25: qty 1

## 2015-12-25 MED ORDER — ENSURE ENLIVE PO LIQD: 237.0000 mL | Freq: Two times a day (BID) | ORAL | Status: DC

## 2015-12-25 MED ORDER — PROMETHAZINE HCL 25 MG/ML IJ SOLN: 6.2500 mg | Freq: Four times a day (QID) | INTRAMUSCULAR | Status: DC | PRN

## 2015-12-25 NOTE — ED Notes (Signed)
Pt here for abd pain, weakness, vomiting x 1 week. sts unable to hold down certain foods.

## 2015-12-25 NOTE — ED Notes (Signed)
Admitting MD at bedside.

## 2015-12-25 NOTE — ED Notes (Signed)
Pt ambulated to restroom. 

## 2015-12-25 NOTE — ED Provider Notes (Signed)
CSN: [id number]     Arrival date & time 12/25/15  1236 History   First MD Initiated Contact with Patient 12/25/15 1508     Chief Complaint  Patient presents with  . Abdominal Pain  . Emesis     (Consider location/radiation/quality/duration/timing/severity/associated sxs/prior Treatment) HPI   35 y f w PMH htn, hl, cad,  Chf, coming in with emesis x2 weeks.  The emesis has been non-bloody, sometimes bilious, last BM was yesterday and was normal.  No fever/chills/abdominal pain.  The emesis usually occurs 2-4 hours after eating and she notes that she has it after every meal.  She is currently being followed by nephro for ckd  Past Medical History  Diagnosis Date  . Hypertension   . Hyperlipidemia   . Myocardial infarction ([place]) 1990  . CAD (coronary artery disease)     post MI in 42  . Osteopenia   . Hypothyroidism   . Fall Sept 2012    with left zygoma fracture  . Normal nuclear stress test July 2012    No ischemia.   . Right bundle branch block   . Tachycardia- per BP machine 08/18/2015  . Acute on chronic diastolic HF (heart failure) ([place]) 08/18/2015  . Hyperglycemia   . Diastolic dysfunction    Past Surgical History  Procedure Laterality Date  . Cardiac catheterization  10/18/1994    Single vessel disease with a severe stenosis in the RCA which had recannalized since prior study in 1990. Managed medically  . Cardiac catheterization  09/22/89  . Cataract extraction, bilateral  10/2009   Family History  Problem Relation Age of Onset  . Heart attack Father 46  . Heart attack Mother 6  . Heart disease Brother   . Heart disease Brother    Social History  Substance Use Topics  . Smoking status: Never Smoker   . Smokeless tobacco: Never Used  . Alcohol Use: No   OB History    No data available     Review of Systems  Constitutional: Negative for fever and chills.  HENT: Negative for nosebleeds.   Eyes: Negative for visual disturbance.  Respiratory: Negative  for cough and shortness of breath.   Cardiovascular: Negative for chest pain.  Gastrointestinal: Positive for nausea and vomiting. Negative for abdominal pain, diarrhea and constipation.  Genitourinary: Negative for dysuria.  Skin: Negative for rash.  Neurological: Negative for weakness.  All other systems reviewed and are negative.     Allergies  Review of patient's allergies indicates no known allergies.  Home Medications   Prior to Admission medications   Medication Sig Start Date End Date Taking? Authorizing Provider  acetaminophen (TYLENOL) 325 MG tablet Take 650 mg by mouth every 6 (six) hours as needed for mild pain or moderate pain.    Historical Provider, MD  aspirin EC 81 MG EC tablet Take 1 tablet (81 mg total) by mouth daily. 08/27/15   Orson Eva, MD  atorvastatin (LIPITOR) 40 MG tablet Take 20 mg by mouth daily. 07/04/15   Historical Provider, MD  carvedilol (COREG) 3.125 MG tablet Take 1 tablet (3.125 mg total) by mouth 2 (two) times daily with a meal. 08/28/15   Orson Eva, MD  ciprofloxacin (CIPRO) 250 MG tablet Take 1 tablet (250 mg total) by mouth 2 (two) times daily. 08/28/15   Orson Eva, MD  ezetimibe (ZETIA) 10 MG tablet Take 1 tablet (10 mg total) by mouth daily. 10/30/12   Burtis Junes, NP  feeding supplement, ENSURE  ENLIVE, (ENSURE ENLIVE) LIQD Take 237 mLs by mouth 2 (two) times daily between meals. 08/27/15   Orson Eva, MD  levothyroxine (SYNTHROID, LEVOTHROID) 100 MCG tablet Take 100 mcg by mouth daily before breakfast.    Historical Provider, MD  metolazone (ZAROXOLYN) 2.5 MG tablet Take 2.5 mg by mouth every Monday, Wednesday, and Friday.    Historical Provider, MD  metroNIDAZOLE (FLAGYL) 500 MG tablet Take 1 tablet (500 mg total) by mouth every 8 (eight) hours. 08/28/15   Orson Eva, MD  predniSONE (DELTASONE) 10 MG tablet Take 10 mg by mouth every other day.     Historical Provider, MD  torsemide (DEMADEX) 100 MG tablet Take 100 mg by mouth daily.    Historical  Provider, MD   BP 142/62 mmHg  Pulse 74  Temp(Src) 98.1 F (36.7 C) (Oral)  Resp 24  SpO2 95% Physical Exam  Constitutional: She is oriented to person, place, and time. No distress.  HENT:  Head: Normocephalic and atraumatic.  Eyes: EOM are normal. Pupils are equal, round, and reactive to light.  Neck: Normal range of motion. Neck supple.  Cardiovascular: Normal rate and intact distal pulses.   Pulmonary/Chest: No respiratory distress.  Abdominal: Soft. She exhibits no distension. There is no tenderness. There is no rebound and no guarding.  Musculoskeletal: Normal range of motion.  Neurological: She is alert and oriented to person, place, and time.  Skin: No rash noted. She is not diaphoretic.  Psychiatric: She has a normal mood and affect.    ED Course  Procedures (including critical care time) Labs Review Labs Reviewed  LIPASE, BLOOD - Abnormal; Notable for the following:    Lipase 78 (*)    All other components within normal limits  COMPREHENSIVE METABOLIC PANEL - Abnormal; Notable for the following:    Glucose, Bld 165 (*)    BUN 47 (*)    Creatinine, Ser 3.67 (*)    Calcium 8.8 (*)    Total Protein 5.9 (*)    Albumin 1.7 (*)    ALT 7 (*)    GFR calc non Af Amer 10 (*)    GFR calc Af Amer 12 (*)    All other components within normal limits  CBC - Abnormal; Notable for the following:    RBC 3.50 (*)    Hemoglobin 10.3 (*)    HCT 32.5 (*)    All other components within normal limits  URINALYSIS, ROUTINE W REFLEX MICROSCOPIC (NOT AT [facility])  BASIC METABOLIC PANEL  CBC  HEMOGLOBIN A1C  I-STAT TROPOININ, ED    Imaging Review Dg Abd Acute W/chest  12/25/2015  CLINICAL DATA:  Weakness, abdominal pain and vomiting EXAM: DG ABDOMEN ACUTE W/ 1V CHEST COMPARISON:  08/26/2015 FINDINGS: Normal heart size. There are bilateral pleural effusions identified left greater than right. Atelectasis is noted in both lung bases. No airspace opacities. The bowel gas pattern appears  nonobstructed. No dilated loops of small or large bowel. Scoliosis deformity is present which appears convex towards the left. There is aortic atherosclerosis present. IMPRESSION: 1. Nonobstructive bowel gas pattern. 2. Bilateral pleural effusions noted. Electronically Signed   By: Kerby Moors M.D.   On: 12/25/2015 16:02   I have personally reviewed and evaluated these images and lab results as part of my medical decision-making.   EKG Interpretation None      MDM   Final diagnoses:  AKI (acute kidney injury) ([place])  Intractable vomiting with nausea, vomiting of unspecified type    87  y f w PMH htn, hl, cad,  Chf, coming in with emesis x2 weeks.   Exam as above unremarkable.  Concern for dehydration.  She has no ttp in her abdomen, no decreased stooling, doubt obstruction.  No chest pain, doubt acs Cbc/cmp with aki with creatinine 3.67, normal K.  Lipase 78.  No leukocytosis  Doubt bowel obstruction but pt did have previous abdominal ct w/ ? Of colonic mass. Will obtain acute abdominal series to eval for evidence of obstruction though clinically seems unlikley and will obtain ct to eval the mass.  Will give 1L NS bolus.  Will plan for admission for aki.  Medicine will be admitting the patient to their service.      Jarome Matin, MD 12/25/15 2348  Wandra Arthurs, MD 12/26/15 Elfrida Yao, MD 12/26/15 [phone]

## 2015-12-25 NOTE — H&P (Signed)
[facility] History and Physical  Erika [name] [id number] DOB: 26-Oct-1928 DOA: 12/25/2015  Referring physician:  PCP: Precious Reel, MD   Chief Complaint: persistent nausea with vomiting  HPI: Erika [name] is a delightful 82 y.o. female with a past medical history that includes hypertension, hyperlipidemia, CAD, CHF presents to the emergency department with the chief complaint of intermittent persistent worsening nausea with vomiting for 2 weeks. Initial evaluation reveals acute on chronic renal failure, hyperglycemia.  Information is obtained from the patient. She reports it for the last 2 weeks she has been persistently nauseated with a few episodes of emesis. She reports the nausea is constant and worsens after eating. Over the 2 weeks she has determined. Bland food she is able to tolerate. She denies any abdominal pain diarrhea constipation bright red blood per rectum or melena. She denies any fever chills recent travel or sick contacts. She denies chest pain palpitations headache dizziness syncope or near-syncope. She does report that her emesis usually occurs 2-4 hours after eating she eats anything beyond the few items she has determined to be non-offenders.  In the emergency department she is afebrile hemodynamically stable and not hypoxic. She is provided with 1 L normal saline and Zofran and intravenously  Review of Systems:  10 point review of systems complete and all systems are negative except as indicated in the history of present illness Past Medical History  Diagnosis Date  . Hypertension   . Hyperlipidemia   . Myocardial infarction ([place]) 1990  . CAD (coronary artery disease)     post MI in 86  . Osteopenia   . Hypothyroidism   . Fall Sept 2012    with left zygoma fracture  . Normal nuclear stress test July 2012    No ischemia.   . Right bundle branch block   . Tachycardia- per BP machine 08/18/2015  . Acute on chronic diastolic HF (heart failure)  ([place]) 08/18/2015  . Hyperglycemia   . Diastolic dysfunction    Past Surgical History  Procedure Laterality Date  . Cardiac catheterization  10/18/1994    Single vessel disease with a severe stenosis in the RCA which had recannalized since prior study in 1990. Managed medically  . Cardiac catheterization  09/22/89  . Cataract extraction, bilateral  10/2009   Social History:  reports that she has never smoked. She has never used smokeless tobacco. She reports that she does not drink alcohol or use illicit drugs. She lives at home with her husband she's done so for the last 72 years. She is retired she uses a walker only occasionally she is independent with ADLs no recent falls No Known Allergies  Family History  Problem Relation Age of Onset  . Heart attack Father 41  . Heart attack Mother 56  . Heart disease Brother   . Heart disease Brother      Prior to Admission medications   Medication Sig Start Date End Date Taking? Authorizing Provider  acetaminophen (TYLENOL) 325 MG tablet Take 650 mg by mouth every 6 (six) hours as needed for mild pain or moderate pain.    Historical Provider, MD  aspirin EC 81 MG EC tablet Take 1 tablet (81 mg total) by mouth daily. 08/27/15   Orson Eva, MD  atorvastatin (LIPITOR) 40 MG tablet Take 20 mg by mouth daily. 07/04/15   Historical Provider, MD  carvedilol (COREG) 3.125 MG tablet Take 1 tablet (3.125 mg total) by mouth 2 (two) times daily with a meal. 08/28/15  Orson Eva, MD  ciprofloxacin (CIPRO) 250 MG tablet Take 1 tablet (250 mg total) by mouth 2 (two) times daily. 08/28/15   Orson Eva, MD  ezetimibe (ZETIA) 10 MG tablet Take 1 tablet (10 mg total) by mouth daily. 10/30/12   Burtis Junes, NP  feeding supplement, ENSURE ENLIVE, (ENSURE ENLIVE) LIQD Take 237 mLs by mouth 2 (two) times daily between meals. 08/27/15   Orson Eva, MD  levothyroxine (SYNTHROID, LEVOTHROID) 100 MCG tablet Take 100 mcg by mouth daily before breakfast.    Historical Provider,  MD  metolazone (ZAROXOLYN) 2.5 MG tablet Take 2.5 mg by mouth every Monday, Wednesday, and Friday.    Historical Provider, MD  metroNIDAZOLE (FLAGYL) 500 MG tablet Take 1 tablet (500 mg total) by mouth every 8 (eight) hours. 08/28/15   Orson Eva, MD  predniSONE (DELTASONE) 10 MG tablet Take 10 mg by mouth every other day.     Historical Provider, MD  torsemide (DEMADEX) 100 MG tablet Take 100 mg by mouth daily.    Historical Provider, MD   Physical Exam: Filed Vitals:   12/25/15 1646 12/25/15 1704 12/25/15 1715 12/25/15 1730  BP: 149/66 139/67 145/70 141/66  Pulse: 73 72 69 70  Temp:      TempSrc:      Resp: 16 33 19 23  SpO2: 97% 95% 98% 96%    Wt Readings from Last 3 Encounters:  08/28/15 62.687 kg (138 lb 3.2 oz)  07/17/15 63.192 kg (139 lb 5 oz)  03/22/15 70.988 kg (156 lb 8 oz)    General:  Appears calm and comfortable Eyes: PERRL, normal lids, irises & conjunctiva ENT: grossly normal hearing, lips & tongue mucous membranes of her mouth are slightly dry Neck: no LAD, masses or thyromegaly Cardiovascular: RRR, no m/r/g. No LE edema. Pedal pulses present and palpable  Respiratory: CTA bilaterally, no w/r/r. Normal respiratory effort. Abdomen: soft, nondistended positive bowel sounds but sluggish nontender to palpation no rebounding Skin: no rash or induration seen on limited exam Musculoskeletal: grossly normal tone BUE/BLE, joints without swelling/erythema Psychiatric: grossly normal mood and affect, speech fluent and appropriate Neurologic: grossly non-focal. Oriented 3 speech clear facial symmetry           Labs on Admission:  Basic Metabolic Panel:  Recent Labs Lab 12/25/15 1339  NA 144  K 4.1  CL 106  CO2 25  GLUCOSE 165*  BUN 47*  CREATININE 3.67*  CALCIUM 8.8*   Liver Function Tests:  Recent Labs Lab 12/25/15 1339  AST 17  ALT 7*  ALKPHOS 64  BILITOT 0.4  PROT 5.9*  ALBUMIN 1.7*    Recent Labs Lab 12/25/15 1339  LIPASE 78*   No results  for input(s): AMMONIA in the last 168 hours. CBC:  Recent Labs Lab 12/25/15 1339  WBC 7.5  HGB 10.3*  HCT 32.5*  MCV 92.9  PLT 332   Cardiac Enzymes: No results for input(s): CKTOTAL, CKMB, CKMBINDEX, TROPONINI in the last 168 hours.  BNP (last 3 results)  Recent Labs  03/07/15 1100 08/18/15 1810 08/26/15 1345  BNP 163.8* 270.4* 280.7*    ProBNP (last 3 results) No results for input(s): PROBNP in the last 8760 hours.  CBG: No results for input(s): GLUCAP in the last 168 hours.  Radiological Exams on Admission: Ct Abdomen Pelvis Wo Contrast  12/25/2015  CLINICAL DATA:  Evaluate for obstruction. Unable to keep more than soup and baked potato down for the past 2 weeks. Abdominal pain and vomiting for  1 week. History of hypertension. EXAM: CT ABDOMEN AND PELVIS WITHOUT CONTRAST TECHNIQUE: Multidetector CT imaging of the abdomen and pelvis was performed following the standard protocol without IV contrast. COMPARISON:  CT of the abdomen and pelvis 08/24/15 FINDINGS: Lower chest: There are bilateral pleural effusions. Trace pericardial effusion is present. Upper abdomen: The liver is small. No focal liver lesions are identified. The spleen is small. No focal splenic lesions are identified. There is dilatation of the distal common bile duct and pancreatic duct, raising the question of a pancreatic head mass. Pancreatic duct measures 6 mm in diameter. The lack of intravenous contrast limits full evaluation of the pancreatic parenchyma. There is new marked bilateral hydronephrosis. Both ureters are dilated to approximately the level of the upper sacrum and no definite stones are seen as a source of obstruction. Gallbladder is present and is distended. There is significant ascites throughout the abdomen and pelvis. There is peritoneal thickening. Focal calcifications are identified along the peritoneal reflection of the liver. The calcifications are identified within the pelvis along the  peritoneum. The findings are suspicious for metastatic disease. Within the central mesentery there is an irregular mass containing calcification measuring 6.5 x 2.4 by 2.1 cm. There are adjacent nonobstructed large and small bowel loops. Gastrointestinal tract: The stomach is distended. Small duodenal diverticulum is present. No evidence for obstruction of small or large bowel loops. The appendix is difficult to visualize given the lack of intravenous and oral contrast. Pelvis: The uterus is present. Ovaries are not well seen. Ascites is identified within the pelvis. Calcified and noncalcified peritoneal studding. Retroperitoneum: There are numerous small foci of soft tissue throughout the mesentery. Findings are suspicious for metastatic disease. Dense atherosclerotic calcification of the abdominal aorta. Abdominal wall: Small paraumbilical hernia containing fat and nondilated partial loop of large bowel. Osseous structures: There is significant lumbar spot spondylosis. Anterolisthesis of L4 on L5 is 5 mm. Significant disc height loss identified at multiple levels. No suspicious lytic or blastic lesions are identified. IMPRESSION: 1. Ascites, peritoneal deposits, and mesenteric studding, suspicious for metastatic disease. 2. Partially calcified mass within the mesentery, likely within the gastrocolic ligament could represent malignancy. Consider carcinoid, GI stromal cell tumor, or metastatic implant. 3. Findings are suspicious for metastatic disease. Considerations include metastatic pancreatic carcinoma, ovarian carcinoma, gastric, or appendiceal carcinoma, carcinoid tumor, or GI stromal cell tumor. Consider further evaluation with abdominal and pelvic ultrasound given the patient's elevated creatinine. 4. New, significant bilateral hydronephrosis, suspicious for tumor obstruction. 5. New dilatation of the pancreatic duct, suspicious for pancreatic head primary or metastatic mass. 6. Bilateral pleural effusions.  The salient findings were discussed with ERIK PROULX on 12/25/2015 at 5:39 pm. Electronically Signed   By: Nolon Nations M.D.   On: 12/25/2015 17:39   Dg Abd Acute W/chest  12/25/2015  CLINICAL DATA:  Weakness, abdominal pain and vomiting EXAM: DG ABDOMEN ACUTE W/ 1V CHEST COMPARISON:  08/26/2015 FINDINGS: Normal heart size. There are bilateral pleural effusions identified left greater than right. Atelectasis is noted in both lung bases. No airspace opacities. The bowel gas pattern appears nonobstructed. No dilated loops of small or large bowel. Scoliosis deformity is present which appears convex towards the left. There is aortic atherosclerosis present. IMPRESSION: 1. Nonobstructive bowel gas pattern. 2. Bilateral pleural effusions noted. Electronically Signed   By: Kerby Moors M.D.   On: 12/25/2015 16:02    EKG: Independently reviewed sinus rhythm with Ventricular premature complex Right bundle branch block Abnormal inferior Q waves  Assessment/Plan Principal Problem:   Intractable nausea and vomiting Active Problems:   CAD (coronary artery disease)   Hyperlipemia   Hypothyroidism-labs 01/2015.   Anemia of chronic disease   Acute renal failure superimposed on stage 4 chronic kidney disease (HCC)   UTI (lower urinary tract infection)   Hyperglycemia  #1. Intractable nausea with vomiting. Etiology unclear. Lipase is only mildly elevated. LFT within the limits of normal. Abdominal x-ray reveals nonobstructive bowel gas pattern. Await results of CT of the abdomen. Of note patient hospitalized 5 months ago with sepsis due to underlying colonic process. See discharge summary dated October 2016 -Admit to [place] -supportive therapy in the form of IV fluids -Scheduled Zofran for 24 hours -Low-dose when necessary Phenergan for any breakthrough -Clear liquid diet to be advanced as able  #2. Acute renal failure superimposed on chronic kidney disease stage III. Creatinine on admission 3.67.  Chart review indicates creatinine 2.762 weeks ago and 1.63 months ago. Of note patient is hospital 5 months ago with -Gentle IV fluids -Hold nephrotoxins -We'll hold Demadex and Zaroxolyn for now -Be met in the morning -Monitor urine output -If no improvement consider renal ultrasound  #3. Anemia of chronic disease. Hemoglobin 10.3 on admission. Baseline -Monitor  #4. CAD. No chest pain. EKG without acute changes. -Continue home meds  #5. Abnormal CT of the abdomen. She and hospitalized 5 months ago CT with colonic wall thickening and adjacent phlegmon -See discharge summary dated October 2016. -Evaluated by GI and general surgery -Outpatient follow-up planned  #6. Chronic diastolic dysfunction. EF 65% with grade 1 diastolic dysfunction currently compensated -Continue home meds -Daily weights -Monitor intake and output  #7. Hyperglycemia. Mild. Likely related related to steroids. Glucose 165 -Monitor -Sensitive sliding scale insulin  #3. UTI. No leukocytosis she is afebrile and nontoxic appearing -Rocephin -Urine culture   Code Status: DNR DVT Prophylaxis: Family Communication: husband and daughter Disposition Plan: home hopefully 24-36 hours  Time spent: 8 minutes  [facility]

## 2015-12-26 ENCOUNTER — Encounter (HOSPITAL_COMMUNITY): Payer: Medicare Other

## 2015-12-26 DIAGNOSIS — Z9889 Other specified postprocedural states: Secondary | ICD-10-CM | POA: Diagnosis not present

## 2015-12-26 DIAGNOSIS — Z7189 Other specified counseling: Secondary | ICD-10-CM | POA: Insufficient documentation

## 2015-12-26 DIAGNOSIS — N184 Chronic kidney disease, stage 4 (severe): Secondary | ICD-10-CM | POA: Diagnosis not present

## 2015-12-26 DIAGNOSIS — Z515 Encounter for palliative care: Secondary | ICD-10-CM

## 2015-12-26 DIAGNOSIS — I5033 Acute on chronic diastolic (congestive) heart failure: Secondary | ICD-10-CM | POA: Diagnosis not present

## 2015-12-26 DIAGNOSIS — Z792 Long term (current) use of antibiotics: Secondary | ICD-10-CM | POA: Diagnosis not present

## 2015-12-26 DIAGNOSIS — I4510 Unspecified right bundle-branch block: Secondary | ICD-10-CM | POA: Diagnosis not present

## 2015-12-26 DIAGNOSIS — Z66 Do not resuscitate: Secondary | ICD-10-CM

## 2015-12-26 DIAGNOSIS — I10 Essential (primary) hypertension: Secondary | ICD-10-CM | POA: Diagnosis not present

## 2015-12-26 DIAGNOSIS — R112 Nausea with vomiting, unspecified: Secondary | ICD-10-CM | POA: Diagnosis present

## 2015-12-26 DIAGNOSIS — C799 Secondary malignant neoplasm of unspecified site: Secondary | ICD-10-CM | POA: Diagnosis not present

## 2015-12-26 DIAGNOSIS — E785 Hyperlipidemia, unspecified: Secondary | ICD-10-CM | POA: Diagnosis not present

## 2015-12-26 DIAGNOSIS — I252 Old myocardial infarction: Secondary | ICD-10-CM | POA: Diagnosis not present

## 2015-12-26 DIAGNOSIS — Z7982 Long term (current) use of aspirin: Secondary | ICD-10-CM | POA: Diagnosis not present

## 2015-12-26 DIAGNOSIS — N179 Acute kidney failure, unspecified: Secondary | ICD-10-CM | POA: Diagnosis not present

## 2015-12-26 DIAGNOSIS — Z9181 History of falling: Secondary | ICD-10-CM | POA: Diagnosis not present

## 2015-12-26 DIAGNOSIS — I2510 Atherosclerotic heart disease of native coronary artery without angina pectoris: Secondary | ICD-10-CM | POA: Diagnosis not present

## 2015-12-26 DIAGNOSIS — G43A0 Cyclical vomiting, not intractable: Secondary | ICD-10-CM | POA: Diagnosis not present

## 2015-12-26 DIAGNOSIS — Z79899 Other long term (current) drug therapy: Secondary | ICD-10-CM | POA: Diagnosis not present

## 2015-12-26 DIAGNOSIS — E039 Hypothyroidism, unspecified: Secondary | ICD-10-CM | POA: Diagnosis not present

## 2015-12-26 DIAGNOSIS — D638 Anemia in other chronic diseases classified elsewhere: Secondary | ICD-10-CM | POA: Diagnosis not present

## 2015-12-26 DIAGNOSIS — M8580 Other specified disorders of bone density and structure, unspecified site: Secondary | ICD-10-CM | POA: Diagnosis not present

## 2015-12-26 LAB — GLUCOSE, CAPILLARY
GLUCOSE-CAPILLARY: 85 mg/dL (ref 65–99)
GLUCOSE-CAPILLARY: 126 mg/dL — AB (ref 65–99)

## 2015-12-26 LAB — BASIC METABOLIC PANEL
ANION GAP: 11 (ref 5–15)
BUN: 48 mg/dL — ABNORMAL HIGH (ref 6–20)
CALCIUM: 8.1 mg/dL — AB (ref 8.9–10.3)
CHLORIDE: 104 mmol/L (ref 101–111)
CO2: 25 mmol/L (ref 22–32)
Creatinine, Ser: 3.65 mg/dL — ABNORMAL HIGH (ref 0.44–1.00)
GFR calc non Af Amer: 10 mL/min — ABNORMAL LOW (ref >60)
GFR, EST AFRICAN AMERICAN: 12 mL/min — AB (ref >60)
Glucose, Bld: 95 mg/dL (ref 65–99)
Potassium: 3.8 mmol/L (ref 3.5–5.1)
Sodium: 140 mmol/L (ref 135–145)

## 2015-12-26 LAB — CBC
HCT: 30.1 % — ABNORMAL LOW (ref 36.0–46.0)
HEMOGLOBIN: 9.2 g/dL — AB (ref 12.0–15.0)
MCH: 28.3 pg (ref 26.0–34.0)
MCHC: 30.6 g/dL (ref 30.0–36.0)
MCV: 92.6 fL (ref 78.0–100.0)
Platelets: 305 10*3/uL (ref 150–400)
RBC: 3.25 MIL/uL — AB (ref 3.87–5.11)
RDW: 15.3 % (ref 11.5–15.5)
WBC: 6.3 10*3/uL (ref 4.0–10.5)

## 2015-12-26 LAB — HEMOGLOBIN A1C
HEMOGLOBIN A1C: 6.4 % — AB (ref 4.8–5.6)
Mean Plasma Glucose: 137 mg/dL

## 2015-12-26 MED ORDER — POLYETHYLENE GLYCOL 3350 17 G PO PACK: 17.0000 g | PACK | Freq: Every day | ORAL | Status: DC

## 2015-12-26 MED ORDER — ONDANSETRON HCL 4 MG PO TABS
4.0000 mg | ORAL_TABLET | Freq: Four times a day (QID) | ORAL | Status: AC | PRN
Start: 2015-12-26 — End: ?

## 2015-12-26 MED ORDER — OXYCODONE HCL 5 MG PO TABS: 5.0000 mg | ORAL_TABLET | Freq: Three times a day (TID) | ORAL | Status: DC | PRN

## 2015-12-26 MED ORDER — ONDANSETRON HCL 4 MG PO TABS: 4.0000 mg | ORAL_TABLET | Freq: Every day | ORAL | Status: DC | PRN

## 2015-12-26 NOTE — Consult Note (Signed)
Consultation Note Date: 12/26/2015   Patient Name: Erika [name]  DOB: August 20, 1928  MRN: [id number]  Age / Sex: 82 y.o., female  PCP: Shon Baton, MD Referring Physician: Dr. Vernell Leep  Reason for Consultation: Goals of Care 82 yo female with a history of chronic kidney disease who presented to the ER on 1/30 for nausea/vomiting/dehydration.  A CT scan was done that revealed bilateral hydronephrosis as well as a mass in her mesentary with retroperitoneal metastasis.  When I met with patient she was very clear.  She did not want a biopsy.  She wanted no further work up.  She wanted to be discharged home with hospice services.  We reviewed the MOST form.  The patient elected:  Do not attempt resuscitation; to receive antibiotics on an individual case bases; no IV Fluids; no PEG or artificial feeding; total comfort care; Do not return to the hospital.  Ms. Snethen is an extremely pleasant individual.  I met with her and her husband at bedside.  She told me that she worked for [employer] for 18 years and was even in their [employer].  She lives at home with her husband.  She would like to die at home.  She does not wish to pass at a residential hospice.  She completed all of her advanced care planning and has named a HCPOA.  She is very clear and comfortable with her decision not to work up this mass but rather to go home with hospice and enjoy the time she has left.  Her husband understood and agreed.   Clinical Assessment/Narrative: 82 yo female with chronic kidney disease pre Contacts/Participants in Discussion:  Patient and husband. Primary Decision Maker: patient Relationship to Patient self HCPOA: yes   SUMMARY OF RECOMMENDATIONS  Code Status/Advance Care Planning: DNR    Code Status Orders        Start     Ordered   12/25/15 1700  Do not attempt resuscitation (DNR)   Continuous      Question Answer Comment  In the event of cardiac or respiratory ARREST Do not call a "code blue"   In the event of cardiac or respiratory ARREST Do not perform Intubation, CPR, defibrillation or ACLS   In the event of cardiac or respiratory ARREST Use medication by any route, position, wound care, and other measures to relive pain and suffering. May use oxygen, suction and manual treatment of airway obstruction as needed for comfort.      12/25/15 1705    Code Status History    Date Active Date Inactive Code Status Order ID Comments User Context   08/26/2015 12:52 PM 08/28/2015  2:05 PM DNR [id number]  Orson Eva, MD Inpatient   08/18/2015 10:34 PM 08/26/2015 12:52 PM Full Code [id number]  Isaiah Serge, NP Inpatient   07/17/2015  7:03 PM 07/20/2015  6:41 PM DNR [id number]  Kelvin Cellar, MD Inpatient   07/17/2015  4:33 PM 07/17/2015  7:03 PM DNR [id number]  Kelvin Cellar, MD ED   03/20/2015  8:07 PM 03/22/2015  4:21 PM DNR [id number]  Gardiner Barefoot, NP Inpatient   03/20/2015 12:34 PM 03/20/2015  8:07 PM Full Code [id number]  Aletta Edouard, MD Inpatient   03/17/2015  1:20 PM 03/20/2015 12:34 PM DNR [id number]  Elmarie Shiley, MD Inpatient       Symptom Management:   Zofran for nausea     Psycho-social/Spiritual:  Support System: strong Desire for further Chaplaincy support:no Additional  Recommendations: none  Prognosis: less than 6 months give peritoneal metastasis  Discharge Planning: home with hospice   Chief Complaint/ Primary Diagnoses: Present on Admission:  . CAD (coronary artery disease) . Hyperlipemia . Hypothyroidism-labs 01/2015. [name] Anemia of chronic disease . Acute renal failure superimposed on stage 4 chronic kidney disease ([place]) . Intractable nausea and vomiting . UTI (lower urinary tract infection) . Hyperglycemia  I have reviewed the medical record, interviewed the patient and family, and examined the patient. The following aspects are pertinent.  Past  Medical History  Diagnosis Date  . Hypertension   . Hyperlipidemia   . Myocardial infarction ([place]) 1990  . CAD (coronary artery disease)     post MI in 49  . Osteopenia   . Hypothyroidism   . Fall Sept 2012    with left zygoma fracture  . Normal nuclear stress test July 2012    No ischemia.   . Right bundle branch block   . Tachycardia- per BP machine 08/18/2015  . Acute on chronic diastolic HF (heart failure) ([place]) 08/18/2015  . Hyperglycemia   . Diastolic dysfunction    Social History   Social History  . Marital Status: Married    Spouse Name: N/A  . Number of Children: N/A  . Years of Education: N/A   Social History Main Topics  . Smoking status: Never Smoker   . Smokeless tobacco: Never Used  . Alcohol Use: No  . Drug Use: No  . Sexual Activity: Yes   Other Topics Concern  . None   Social History Narrative   Family History  Problem Relation Age of Onset  . Heart attack Father 56  . Heart attack Mother 27  . Heart disease Brother   . Heart disease Brother    Scheduled Meds: . carvedilol  3.125 mg Oral BID WC  . cefTRIAXone (ROCEPHIN)  IV  1 g Intravenous Q24H  . heparin  5,000 Units Subcutaneous 3 times per day  . levothyroxine  100 mcg Oral QAC breakfast  . metoCLOPramide (REGLAN) injection  10 mg Intravenous 3 times per day  . ondansetron (ZOFRAN) IV  4 mg Intravenous Q4H  . polyethylene glycol  17 g Oral Daily  . predniSONE  10 mg Oral QODAY   Continuous Infusions:  PRN Meds:.acetaminophen **OR** acetaminophen, bisacodyl, HYDROcodone-acetaminophen, morphine injection, ondansetron **OR** ondansetron (ZOFRAN) IV, polyethylene glycol, promethazine, sodium phosphate, traZODone Medications Prior to Admission:  Prior to Admission medications   Medication Sig Start Date End Date Taking? Authorizing Provider  acetaminophen (TYLENOL) 325 MG tablet Take 650 mg by mouth every 6 (six) hours as needed for mild pain or moderate pain.   Yes Historical Provider, MD    aspirin EC 81 MG EC tablet Take 1 tablet (81 mg total) by mouth daily. 08/27/15  Yes Orson Eva, MD  atorvastatin (LIPITOR) 40 MG tablet Take 20 mg by mouth daily. 07/04/15  Yes Historical Provider, MD  carvedilol (COREG) 3.125 MG tablet Take 1 tablet (3.125 mg total) by mouth 2 (two) times daily with a meal. 08/28/15  Yes Orson Eva, MD  cyclophosphamide (CYTOXAN) 50 MG capsule Take 50 mg by mouth daily. 10/16/15  Yes Historical Provider, MD  SYNTHROID 88 MCG tablet Take 88 mcg by mouth daily before breakfast. 11/22/15  Yes Historical Provider, MD  ondansetron (ZOFRAN) 4 MG tablet Take 1 tablet (4 mg total) by mouth every 6 (six) hours as needed for nausea or vomiting. 12/26/15   Modena Jansky, MD   No  Known Allergies  Review of Systems:  Complaints of HA, changes in vision, sore throat, chest pain, palpitations, constipation, rash, bruising, dizziness  Physical Exam  Elderly frail female, NAD, husband at bedside CV:  S1, S2 Resp:  Clear Abdomen:  Moderately distended Extremities:  No swelling  Vital Signs: BP 153/58 mmHg  Pulse 73  Temp(Src) 98.2 F (36.8 C) (Oral)  Resp 20  Ht '5\' 3"'$  (1.6 m)  Wt 55.2 kg (121 lb 11.1 oz)  BMI 21.56 kg/m2  SpO2 90%  SpO2: SpO2: 90 % O2 Device:SpO2: 90 % O2 Flow Rate: .   IO: Intake/output summary:  Intake/Output Summary (Last 24 hours) at 12/26/15 1650 Last data filed at 12/26/15 1333  Gross per 24 hour  Intake 1607.5 ml  Output      0 ml  Net 1607.5 ml    LBM: Last BM Date: 12/24/15 Baseline Weight: Weight: 55.339 kg (122 lb) Most recent weight: Weight: 55.2 kg (121 lb 11.1 oz)      Palliative Assessment/Data:  Flowsheet Rows        Most Recent Value   Intake Tab    Referral Department  Hospitalist   Unit at Time of Referral  Med/Surg Unit   Palliative Care Primary Diagnosis  Cancer   Date Notified  12/26/15   Palliative Care Type  New Palliative care   Reason for referral  Clarify Goals of Care   Date of Admission  12/25/15    Date first seen by Palliative Care  12/26/15   # of days Palliative referral response time  0 Day(s)   # of days IP prior to Palliative referral  1   Clinical Assessment    Palliative Performance Scale Score  60%   Pain Min Last 24 hours  0   Dyspnea Max Last 24 Hours  0   Dyspnea Min Last 24 hours  0   Nausea Max Last 24 Hours  0   Nausea Min Last 24 Hours  0   Anxiety Max Last 24 Hours  0   Anxiety Min Last 24 Hours  0   Psychosocial & Spiritual Assessment    Palliative Care Outcomes    Patient/Family meeting held?  Yes   Who was at the meeting?  Patient, Husband, Imogene Burn   Palliative Care Outcomes  Other (Comment)   Patient/Family wishes: Interventions discontinued/not started   Mechanical Ventilation, Antibiotics, Transfusion, Vasopressors, BiPAP, Tube feedings/TPN, Hemodialysis, Transfer out of ICU, NIPPV, Other (Comment), PEG, Trach   Palliative Care follow-up planned  No   Actual Discharge Date  12/26/15 [home with hospice]      Additional Data Reviewed:  CBC:    Component Value Date/Time   WBC 6.3 12/26/2015 0609   WBC 7.9 02/06/2015 1116   HGB 9.2* 12/26/2015 0609   HGB 12.1* 02/06/2015 1116   HCT 30.1* 12/26/2015 0609   HCT 26.2* 07/19/2015 1345   HCT 38.9 02/06/2015 1116   PLT 305 12/26/2015 0609   MCV 92.6 12/26/2015 0609   MCV 91.9 02/06/2015 1116   NEUTROABS 3.2 08/25/2015 0306   LYMPHSABS 0.1* 08/25/2015 0306   MONOABS 0.2 08/25/2015 0306   EOSABS 0.0 08/25/2015 0306   BASOSABS 0.0 08/25/2015 0306   Comprehensive Metabolic Panel:    Component Value Date/Time   NA 140 12/26/2015 0609   K 3.8 12/26/2015 0609   CL 104 12/26/2015 0609   CO2 25 12/26/2015 0609   BUN 48* 12/26/2015 0609   CREATININE 3.65* 12/26/2015 1779  CREATININE 0.96 02/17/2015 1812   GLUCOSE 95 12/26/2015 0609   CALCIUM 8.1* 12/26/2015 0609   AST 17 12/25/2015 1339   ALT 7* 12/25/2015 1339   ALKPHOS 64 12/25/2015 1339   BILITOT 0.4 12/25/2015 1339   PROT 5.9* 12/25/2015  1339   ALBUMIN 1.7* 12/25/2015 1339     Time In: 1400 Time Out: 1445 Time Total: 45 min Greater than 50%  of this time was spent counseling and coordinating care related to the above assessment and plan.  Signed by:   Imogene Burn, PA-C Palliative Medicine Pager: [phone]   12/26/2015, 4:50 PM  Please contact Palliative Medicine Team phone at [phone] for questions and concerns.

## 2015-12-26 NOTE — Progress Notes (Signed)
She is aware of all the findings. She is alert and oriented and understands all that is going on with her. She does not want invasive tests procedures or to progress towards XRT or CHEMO. She is at peace and knows she is dying. I explained Hospice/Palliative care and she will accept that and just wants to make sure pain and suffering can be controlled. I can/will be her attending on Hospice She knows she has a short life expectancy from here.

## 2015-12-26 NOTE — Consult Note (Deleted)
No Charge.    I spoke with Ms. Rapalo and her husband at length.  She understands her condition.  She politely refuses hospice.  She desires discharge with [facility].  She is currently in the process of being discharged.  We went over the MOST form, filled it out and I left it with her.   She desires COMFORT MEASURES ONLY.  She does not wish to return to the hospital.  Imogene Burn, PA-C Palliative Medicine Pager: [phone]

## 2015-12-26 NOTE — Care Management Note (Addendum)
Case Management Note  Patient Details  Name: Erika [name] MRN: [id number] Date of Birth: [birth year]  Subjective/Objective:        CM following for progression and d/c planning.            Action/Plan: 12/26/2015 Met with pt and husband, pt wishes to have Hospice and [facility]) provide her [facility] services. The pt states that she has a walker and 3:1 and she does not think that she needs a hospital bed at this time. The pt is ready to d/c and states that [facility] can come to her home to talk with her if they are no available this afternoon, she would like to go on home.  Phone numbers provided by pt and husband and given to [facility] [phone] and [phone]. Expected Discharge Date:        12/26/2015          Expected Discharge Plan:  Home w Hospice Care  In-House Referral:  NA  Discharge planning Services  CM Consult  Post Acute Care Choice:  Hospice Choice offered to:  Patient  DME Arranged:  N/A DME Agency:  NA  HH Arranged:  RN, Nurse's Aide, Social Work [employer] Agency:  Hospice and [place] of Service:  Completed, signed off  Medicare Important Message Given:    Date Medicare IM Given:    Medicare IM give by:    Date Additional Medicare IM Given:    Additional Medicare Important Message give by:     If discussed at [place] of Stay Meetings, dates discussed:    Additional Comments:  Adron Bene, RN 12/26/2015, 1:47 PM

## 2015-12-26 NOTE — Discharge Instructions (Signed)
Metastatic Cancer °Metastatic cancer is cancer that has spread from the place where it started (primary site) to another part of the body. The process of cancer spreading from the primary site is called metastasis. When cancer cells metastasize, they do not change the way they look or the way they affect the body. Primary lung cancer that spreads to the brain is metastatic lung cancer, not brain cancer. Cancer cells can spread: °· Directly from one part of the body to a nearby area (local invasion). °· Into a lymph vessel and be carried through the lymph system to lymph nodes and other parts of the body. The lymph system is a network of vessels and nodes that carry fluid throughout the body and help to protect against infections. °· Into the blood vessels and be carried to other parts of the body through the bloodstream. °WHAT TYPES OF CANCER CAN SPREAD? ° All types of cancer can spread. Some cancers are more likely to metastasize than others. The most common places that cancers metastasize to are: °· Bones. °· Liver. °· Lungs. °Cancers that are more advanced when they are diagnosed and treated are more likely to metastasize. Some primary cancers are more likely to metastasize to a specific part of the body. For example: °· Breast cancer may spread to: °¨ Bones. °¨ Brain. °¨ Liver. °¨ Lungs. °· Lung cancer may spread to: °¨ Bones. °¨ Brain. °¨ Liver. °¨ Adrenal gland. °¨ Other lung. °· Melanoma skin cancer may spread to: °¨ Bones. °¨ Brain. °¨ Liver. °¨ Lungs. °¨ Muscles. °· Prostate cancer may spread to: °¨ Bones. °¨ Liver. °¨ Lungs. °¨ Adrenal gland. °· Colon cancer may spread to: °¨ Tissue that lines the abdominal wall and covers most of the abdominal organs. °¨ Liver. °¨ Lungs. °WHAT ARE THE RISKS FOR METASTATIC CANCER? °Your risk for metastatic cancer depends on: °· The type of cancer that you have. °· The stage and grade of your primary cancer at the time of diagnosis. °Tumors are graded by looking at tumor  cells under a microscope. Grading predicts how quickly the tumor cells will grow. Your health care provider will use the stage and the grade of your primary cancer to determine the chances of metastasis. This helps your health care provider to find the best treatment for you. °Risk for metastasis may go up with: °· A larger primary tumor. °· A higher grade of tumor. °· Deeper growth of tumor. °· Lymph node involvement. °HOW IS METASTATIC CANCER DIAGNOSED? °Your health care provider may suspect metastatic cancer from your signs and symptoms. Some people do not have any symptoms. Their cancer is found through imaging or other tests. °· Symptoms may include: °¨ Weakness. °¨ Lack of energy. °¨ Pain. °¨ Weight loss. °¨ Trouble breathing. °· Signs may include: °¨ Fluid buildup in your lungs or belly. °¨ Tumor growths that can be felt or seen. °¨ An enlarged liver. °Your health care provider will also do a physical exam. This may include: °· Blood tests to check for certain substances that are secreted by tumors (tumor markers). °¨ Tumor markers that increase after treatment can indicate metastasis. °¨ Tumor markers may be used to help diagnose metastasis in colon and prostate cancer. °¨ Not all cancers have tumor markers. °· Imaging studies, such as: °¨ X-rays. °¨ Ultrasound. °¨ MRI. °¨ Other imaging tests, such as CT scans, bone scans, and PET scans. °· Biopsy. °¨ This involves checking a small piece of tissue from a new cancer site to   see if the cells are similar to cancer cells from the primary site. This can confirm metastatic cancer. °¨ Biopsies may be done by surgically removing a piece of tissue or using a needle to get a tissue sample. °· Testing fluid samples from the lungs, spine, or belly for metastatic cancer cells. °WHAT ARE THE TREATMENT OPTIONS FOR METASTATIC CANCER? °There are many options for treating metastatic cancer. Your treatment will depend on: °· The type of cancer that you have. °· How far your  cancer has advanced. °· Your general health. °Treatment may not be able to cure metastatic cancer, but it can often relieve the symptoms. In many cases, you may have a combination of treatments. Options may include: °· Surgery. °· Cancer-killing drugs (chemotherapy). °· X-ray treatment (radiation therapy). °· Hormone therapy. °· Treatments that help your body to fight cancer (biologic therapy). °CAN METASTATIC CANCER BE PREVENTED? °The only way to prevent metastatic cancer is to find your primary cancer early and treat it successfully. Talk with your health care provider about cancer screening. Screening exams for early detection are available for some types of cancer, including: °· Breast. °· Colon. °· Prostate. °· Lung. °· Cervical. °HOW CAN I LEARN MORE? °The following websites provide more information. °· National Cancer Institute (NCI) http://www.cancer.gov/cancertopics/what-is-cancer/metastatic-fact-sheet °· American Cancer Society (ACS) http://www.cancer.org/treatment/understandingyourdiagnosis/advancedcancer/advanced-cancer-what-is-metastatic °  °This information is not intended to replace advice given to you by your health care provider. Make sure you discuss any questions you have with your health care provider. °  °Document Released: 03/18/2005 Document Revised: 12/02/2014 Document Reviewed: 02/24/2014 °Elsevier Interactive Patient Education ©2016 [employer] ° ° ° °

## 2015-12-26 NOTE — Discharge Summary (Addendum)
Physician Discharge Summary  Erika [name] [id number] DOB: Apr 17, 1928 DOA: 12/25/2015  PCP: Precious Reel, MD  Primary Nephrologist: Dr. Richarda Osmond Primary Cardiologist: Dr. Wynonia Lawman  Admit date: 12/25/2015 Discharge date: 12/26/2015  Time spent: Greater than 30 minutes  Recommendations for Outpatient Follow-up:  1. Dr. Shon Baton, PCP/will also be her hospice M.D. 2. [facility] [place]-will follow up patient at home.  Discharge Diagnoses:  Principal Problem:   Intractable nausea and vomiting Active Problems:   CAD (coronary artery disease)   Hyperlipemia   Hypothyroidism-labs 01/2015.   Anemia of chronic disease   Acute renal failure superimposed on stage 4 chronic kidney disease (HCC)   UTI (lower urinary tract infection)   Hyperglycemia   Discharge Condition: Improved & Stable  Diet recommendation: Regular diet.   Filed Weights   12/25/15 1814 12/25/15 2016  Weight: 55.339 kg (122 lb) 55.2 kg (121 lb 11.1 oz)    History of present illness & Hospital course:  82 year old pleasant female with history of HTN, HLD, MI, CAD, hypothyroid, chronic diastolic CHF, chronic kidney disease, glomerulonephritis on cyclophosphamide, presented to [facility] ED on 12/25/15 with complaints of nausea, vomiting and generalized weakness. She was advised by her PCP to come to the ED. She was in her usual state of health until about 2 weeks ago when she had 4-5 days history of nonbloody nausea and vomiting up to 3-4 episodes per day. These were not associated with abdominal pain, constipation, diarrhea, fever or chills. She changed her diet from regular consistency to soft diet such as mashed potatoes, and soups. She has not had any further episodes of nausea or vomiting for approximately 8 days. Her appetite has decreased and feels generally weak. She denies abdominal distention. Denies prior history of malignancy and has never had a colonoscopy. In the ED, she was  hemodynamically stable. Lab work confirmed acute on chronic kidney disease with BUN 47, creatinine 3.67 and anemia with hemoglobin of 10.3. She was hospitalized for further evaluation and management. Lipase was mildly elevated. Abdominal x-ray was unremarkable. She was started on IV fluids and supportive/symptomatic treatment. Noncontrasted CT abdomen was obtained which showed findings suggestive of extensive/metastatic malignancy. She also has obstructive hydronephrosis related to malignancy and hence acute on chronic kidney disease. I discussed at length with patient and her spouse at bedside regarding options of aggressive intervention i.e. biopsy for pathology followed by chemotherapy or radiation which she may not be able to tolerate well given her advanced age and poor baseline physical status. Advised her that she may not be a candidate for surgery. Also indicated that this will not be curable. Versus option of hospice/comfort oriented care. I also contacted her PCP who discussed in detail with patient who finally decided that she does not wish to proceed with any aggressive investigation or treatment for likely metastatic cancer. She wishes to go home with home hospice which has been arranged by case management. Patient has no acute symptoms at this time.    Consultations:  None  Procedures:  None    Discharge Exam:  Complaints:  Denies complaints. Asking if she can go home. Denies nausea, vomiting, abdominal pain, constipation or diarrhea. States that she has not vomited for approximately 8 days. She states that she came to the hospital because she was "dehydrated".   Filed Vitals:   12/25/15 1814 12/25/15 2016 12/26/15 0517 12/26/15 0821  BP: 143/52 108/47 125/51 153/58  Pulse: 69 64 72 73  Temp: 98.2 F (36.8 C)  98.1 F (36.7 C) 97.8 F (36.6 C) 98.2 F (36.8 C)  TempSrc: Oral Oral Oral Oral  Resp: 20 18 19 20   Height: 5\' 3"  (1.6 m)     Weight: 55.339 kg (122 lb) 55.2 kg (121  lb 11.1 oz)    SpO2: 93% 94% 91% 90%    General exam: Pleasant elderly female, frail, lying comfortably supine in bed.  Respiratory system: Clear. No increased work of breathing. Cardiovascular system: S1 & S2 heard, RRR. No JVD, murmurs, gallops, clicks or pedal edema. Gastrointestinal system: Abdomen is mildly distended but soft and nontender. Normal bowel sounds heard. Central nervous system: Alert and oriented. No focal neurological deficits. Extremities: Symmetric 5 x 5 power.  Discharge Instructions      Discharge Instructions    (HEART FAILURE PATIENTS) Call MD:  Anytime you have any of the following symptoms: 1) 3 pound weight gain in 24 hours or 5 pounds in 1 week 2) shortness of breath, with or without a dry hacking cough 3) swelling in the hands, feet or stomach 4) if you have to sleep on extra pillows at night in order to breathe.    Complete by:  As directed      Call MD for:  difficulty breathing, headache or visual disturbances    Complete by:  As directed      Call MD for:  extreme fatigue    Complete by:  As directed      Call MD for:  persistant dizziness or light-headedness    Complete by:  As directed      Call MD for:  persistant nausea and vomiting    Complete by:  As directed      Call MD for:  severe uncontrolled pain    Complete by:  As directed      Call MD for:  temperature >100.4    Complete by:  As directed      Diet general    Complete by:  As directed      Increase activity slowly    Complete by:  As directed             Medication List    STOP taking these medications        torsemide 10 MG tablet  Commonly known as:  DEMADEX      TAKE these medications        acetaminophen 325 MG tablet  Commonly known as:  TYLENOL  Take 650 mg by mouth every 6 (six) hours as needed for mild pain or moderate pain.     aspirin 81 MG EC tablet  Take 1 tablet (81 mg total) by mouth daily.     atorvastatin 40 MG tablet  Commonly known as:  LIPITOR   Take 20 mg by mouth daily.     carvedilol 3.125 MG tablet  Commonly known as:  COREG  Take 1 tablet (3.125 mg total) by mouth 2 (two) times daily with a meal.     cyclophosphamide 50 MG capsule  Commonly known as:  CYTOXAN  Take 50 mg by mouth daily.     ondansetron 4 MG tablet  Commonly known as:  ZOFRAN  Take 1 tablet (4 mg total) by mouth every 6 (six) hours as needed for nausea or vomiting.     SYNTHROID 88 MCG tablet  Generic drug:  levothyroxine  Take 88 mcg by mouth daily before breakfast.       Follow-up Information    Schedule an  appointment as soon as possible for a visit with Precious Reel, MD.   Specialty:  Internal Medicine   Why:  As needed, If symptoms worsen   Contact information:   [place] [address] [phone]        The results of significant diagnostics from this hospitalization (including imaging, microbiology, ancillary and laboratory) are listed below for reference.    Significant Diagnostic Studies: Ct Abdomen Pelvis Wo Contrast  12/25/2015  CLINICAL DATA:  Evaluate for obstruction. Unable to keep more than soup and baked potato down for the past 2 weeks. Abdominal pain and vomiting for 1 week. History of hypertension. EXAM: CT ABDOMEN AND PELVIS WITHOUT CONTRAST TECHNIQUE: Multidetector CT imaging of the abdomen and pelvis was performed following the standard protocol without IV contrast. COMPARISON:  CT of the abdomen and pelvis 08/24/15 FINDINGS: Lower chest: There are bilateral pleural effusions. Trace pericardial effusion is present. Upper abdomen: The liver is small. No focal liver lesions are identified. The spleen is small. No focal splenic lesions are identified. There is dilatation of the distal common bile duct and pancreatic duct, raising the question of a pancreatic head mass. Pancreatic duct measures 6 mm in diameter. The lack of intravenous contrast limits full evaluation of the pancreatic parenchyma. There is new marked  bilateral hydronephrosis. Both ureters are dilated to approximately the level of the upper sacrum and no definite stones are seen as a source of obstruction. Gallbladder is present and is distended. There is significant ascites throughout the abdomen and pelvis. There is peritoneal thickening. Focal calcifications are identified along the peritoneal reflection of the liver. The calcifications are identified within the pelvis along the peritoneum. The findings are suspicious for metastatic disease. Within the central mesentery there is an irregular mass containing calcification measuring 6.5 x 2.4 by 2.1 cm. There are adjacent nonobstructed large and small bowel loops. Gastrointestinal tract: The stomach is distended. Small duodenal diverticulum is present. No evidence for obstruction of small or large bowel loops. The appendix is difficult to visualize given the lack of intravenous and oral contrast. Pelvis: The uterus is present. Ovaries are not well seen. Ascites is identified within the pelvis. Calcified and noncalcified peritoneal studding. Retroperitoneum: There are numerous small foci of soft tissue throughout the mesentery. Findings are suspicious for metastatic disease. Dense atherosclerotic calcification of the abdominal aorta. Abdominal wall: Small paraumbilical hernia containing fat and nondilated partial loop of large bowel. Osseous structures: There is significant lumbar spot spondylosis. Anterolisthesis of L4 on L5 is 5 mm. Significant disc height loss identified at multiple levels. No suspicious lytic or blastic lesions are identified. IMPRESSION: 1. Ascites, peritoneal deposits, and mesenteric studding, suspicious for metastatic disease. 2. Partially calcified mass within the mesentery, likely within the gastrocolic ligament could represent malignancy. Consider carcinoid, GI stromal cell tumor, or metastatic implant. 3. Findings are suspicious for metastatic disease. Considerations include metastatic  pancreatic carcinoma, ovarian carcinoma, gastric, or appendiceal carcinoma, carcinoid tumor, or GI stromal cell tumor. Consider further evaluation with abdominal and pelvic ultrasound given the patient's elevated creatinine. 4. New, significant bilateral hydronephrosis, suspicious for tumor obstruction. 5. New dilatation of the pancreatic duct, suspicious for pancreatic head primary or metastatic mass. 6. Bilateral pleural effusions. The salient findings were discussed with ERIK PROULX on 12/25/2015 at 5:39 pm. Electronically Signed   By: Nolon Nations M.D.   On: 12/25/2015 17:39   Dg Abd Acute W/chest  12/25/2015  CLINICAL DATA:  Weakness, abdominal pain and vomiting EXAM: DG ABDOMEN  ACUTE W/ 1V CHEST COMPARISON:  08/26/2015 FINDINGS: Normal heart size. There are bilateral pleural effusions identified left greater than right. Atelectasis is noted in both lung bases. No airspace opacities. The bowel gas pattern appears nonobstructed. No dilated loops of small or large bowel. Scoliosis deformity is present which appears convex towards the left. There is aortic atherosclerosis present. IMPRESSION: 1. Nonobstructive bowel gas pattern. 2. Bilateral pleural effusions noted. Electronically Signed   By: Kerby Moors M.D.   On: 12/25/2015 16:02    Microbiology: No results found for this or any previous visit (from the past 240 hour(s)).   Labs: Basic Metabolic Panel:  Recent Labs Lab 12/25/15 1339 12/26/15 0609  NA 144 140  K 4.1 3.8  CL 106 104  CO2 25 25  GLUCOSE 165* 95  BUN 47* 48*  CREATININE 3.67* 3.65*  CALCIUM 8.8* 8.1*   Liver Function Tests:  Recent Labs Lab 12/25/15 1339  AST 17  ALT 7*  ALKPHOS 64  BILITOT 0.4  PROT 5.9*  ALBUMIN 1.7*    Recent Labs Lab 12/25/15 1339  LIPASE 78*   No results for input(s): AMMONIA in the last 168 hours. CBC:  Recent Labs Lab 12/25/15 1339 12/26/15 0609  WBC 7.5 6.3  HGB 10.3* 9.2*  HCT 32.5* 30.1*  MCV 92.9 92.6  PLT 332  305   Cardiac Enzymes: No results for input(s): CKTOTAL, CKMB, CKMBINDEX, TROPONINI in the last 168 hours. BNP: BNP (last 3 results)  Recent Labs  03/07/15 1100 08/18/15 1810 08/26/15 1345  BNP 163.8* 270.4* 280.7*    ProBNP (last 3 results) No results for input(s): PROBNP in the last 8760 hours.  CBG:  Recent Labs Lab 12/25/15 1833 12/25/15 2014 12/26/15 0741 12/26/15 1119  GLUCAP 81 124* 85 126*       Signed:  Sherly Brodbeck, MD, FACP, FHM. [facility] Pager [phone]  If 7PM-7AM, please contact night-coverage [email or web address] Password [email or web address] 12/26/2015, 3:11 PM

## 2015-12-26 NOTE — Progress Notes (Signed)
Erika [name] to be D/C'd Home per MD order.  Discussed prescriptions and follow up appointments with the patient. Prescriptions given to patient, medication list explained in detail. Pt verbalized understanding.    Medication List    STOP taking these medications        torsemide 10 MG tablet  Commonly known as:  DEMADEX      TAKE these medications        acetaminophen 325 MG tablet  Commonly known as:  TYLENOL  Take 650 mg by mouth every 6 (six) hours as needed for mild pain or moderate pain.     aspirin 81 MG EC tablet  Take 1 tablet (81 mg total) by mouth daily.     atorvastatin 40 MG tablet  Commonly known as:  LIPITOR  Take 20 mg by mouth daily.     carvedilol 3.125 MG tablet  Commonly known as:  COREG  Take 1 tablet (3.125 mg total) by mouth 2 (two) times daily with a meal.     cyclophosphamide 50 MG capsule  Commonly known as:  CYTOXAN  Take 50 mg by mouth daily.     ondansetron 4 MG tablet  Commonly known as:  ZOFRAN  Take 1 tablet (4 mg total) by mouth every 6 (six) hours as needed for nausea or vomiting.     SYNTHROID 88 MCG tablet  Generic drug:  levothyroxine  Take 88 mcg by mouth daily before breakfast.        Filed Vitals:   12/26/15 0517 12/26/15 0821  BP: 125/51 153/58  Pulse: 72 73  Temp: 97.8 F (36.6 C) 98.2 F (36.8 C)  Resp: 19 20    Skin clean, dry and intact without evidence of skin break down, no evidence of skin tears noted. IV catheter discontinued intact. Site without signs and symptoms of complications. Dressing and pressure applied. Pt denies pain at this time. No complaints noted.  An After Visit Summary was printed and given to the patient. Patient escorted via [place], and D/C home via private auto.  Marijean Heath C 12/26/2015 5:24 PM

## 2016-01-24 DEATH — deceased
